# Patient Record
Sex: Male | Born: 1955
Health system: Southern US, Community
[De-identification: ages and names within clinical notes are randomized; demographics above are authoritative.]

## PROBLEM LIST (undated history)

## (undated) ENCOUNTER — Ambulatory Visit: Admission: EM | Source: Home / Self Care

## (undated) DIAGNOSIS — R55 Syncope and collapse: Secondary | ICD-10-CM

## (undated) DIAGNOSIS — G4700 Insomnia, unspecified: Secondary | ICD-10-CM

## (undated) DIAGNOSIS — Z86010 Personal history of colonic polyps: Secondary | ICD-10-CM

## (undated) DIAGNOSIS — M1990 Unspecified osteoarthritis, unspecified site: Secondary | ICD-10-CM

## (undated) DIAGNOSIS — E785 Hyperlipidemia, unspecified: Secondary | ICD-10-CM

## (undated) DIAGNOSIS — R569 Unspecified convulsions: Secondary | ICD-10-CM

## (undated) DIAGNOSIS — T7840XA Allergy, unspecified, initial encounter: Secondary | ICD-10-CM

## (undated) HISTORY — DX: Syncope and collapse: R55

## (undated) HISTORY — PX: NASAL FRACTURE SURGERY: SHX718

## (undated) HISTORY — PX: DENTAL SURGERY: SHX609

## (undated) HISTORY — PX: TONSILLECTOMY AND ADENOIDECTOMY: SUR1326

## (undated) HISTORY — DX: Hyperlipidemia, unspecified: E78.5

## (undated) HISTORY — DX: Personal history of colonic polyps: Z86.010

## (undated) HISTORY — DX: Allergy, unspecified, initial encounter: T78.40XA

## (undated) HISTORY — DX: Unspecified convulsions: R56.9

## (undated) HISTORY — DX: Unspecified osteoarthritis, unspecified site: M19.90

## (undated) HISTORY — DX: Insomnia, unspecified: G47.00

---

## 2002-06-05 ENCOUNTER — Encounter: Payer: Self-pay | Admitting: Emergency Medicine

## 2002-06-05 ENCOUNTER — Emergency Department (HOSPITAL_COMMUNITY): Admission: EM | Admit: 2002-06-05 | Discharge: 2002-06-05 | Payer: Self-pay | Admitting: Emergency Medicine

## 2004-12-04 ENCOUNTER — Encounter: Admission: RE | Admit: 2004-12-04 | Discharge: 2004-12-04 | Payer: Self-pay | Admitting: Family Medicine

## 2005-04-22 DIAGNOSIS — Z860100 Personal history of colon polyps, unspecified: Secondary | ICD-10-CM

## 2005-04-22 HISTORY — PX: OTHER SURGICAL HISTORY: SHX169

## 2005-04-22 HISTORY — DX: Personal history of colonic polyps: Z86.010

## 2005-04-22 HISTORY — DX: Personal history of colon polyps, unspecified: Z86.0100

## 2005-10-30 ENCOUNTER — Ambulatory Visit: Payer: Self-pay | Admitting: Family Medicine

## 2005-11-01 ENCOUNTER — Ambulatory Visit: Payer: Self-pay | Admitting: Family Medicine

## 2006-01-06 ENCOUNTER — Ambulatory Visit: Payer: Self-pay | Admitting: Internal Medicine

## 2006-01-14 ENCOUNTER — Ambulatory Visit: Payer: Self-pay | Admitting: Internal Medicine

## 2006-01-31 ENCOUNTER — Ambulatory Visit: Payer: Self-pay | Admitting: Family Medicine

## 2006-02-14 ENCOUNTER — Ambulatory Visit: Payer: Self-pay | Admitting: Family Medicine

## 2006-02-14 LAB — CONVERTED CEMR LAB
AST: 58 units/L — ABNORMAL HIGH (ref 0–37)
Chol/HDL Ratio, serum: 3.7
Cholesterol: 230 mg/dL (ref 0–200)
HDL: 61.6 mg/dL (ref >39.0)
LDL DIRECT: 149.3 mg/dL
VLDL: 19 mg/dL (ref 0–40)

## 2006-05-01 ENCOUNTER — Ambulatory Visit: Payer: Self-pay | Admitting: Family Medicine

## 2006-05-01 LAB — CONVERTED CEMR LAB: ALT: 86 units/L — ABNORMAL HIGH (ref 0–40)

## 2006-05-07 ENCOUNTER — Ambulatory Visit: Payer: Self-pay | Admitting: Family Medicine

## 2006-05-07 LAB — CONVERTED CEMR LAB: Hep B S Ab: NEGATIVE

## 2006-06-06 ENCOUNTER — Ambulatory Visit: Payer: Self-pay | Admitting: Internal Medicine

## 2006-06-06 LAB — CONVERTED CEMR LAB
ALT: 54 units/L — ABNORMAL HIGH (ref 0–40)
AST: 34 units/L (ref 0–37)
Albumin: 4.3 g/dL (ref 3.5–5.2)
Alkaline Phosphatase: 80 units/L (ref 39–117)
Anti Nuclear Antibody(ANA): NEGATIVE
Bilirubin, Direct: 0.2 mg/dL (ref 0.0–0.3)
Ceruloplasmin: 41 mg/dL (ref 21–63)
GGT: 323 units/L — ABNORMAL HIGH (ref 7–51)
Total Bilirubin: 1.1 mg/dL (ref 0.3–1.2)
Total Protein: 6.7 g/dL (ref 6.0–8.3)

## 2006-07-29 ENCOUNTER — Ambulatory Visit: Payer: Self-pay | Admitting: Family Medicine

## 2006-08-12 ENCOUNTER — Ambulatory Visit: Payer: Self-pay | Admitting: Family Medicine

## 2006-08-12 ENCOUNTER — Ambulatory Visit: Payer: Self-pay | Admitting: Internal Medicine

## 2006-08-12 LAB — CONVERTED CEMR LAB
AST: 28 units/L (ref 0–37)
Bilirubin, Direct: 0.1 mg/dL (ref 0.0–0.3)
Ferritin: 153 ng/mL (ref 22.0–322.0)
Triglycerides: 154 mg/dL — ABNORMAL HIGH (ref 0–149)

## 2006-09-25 ENCOUNTER — Ambulatory Visit: Payer: Self-pay | Admitting: Family Medicine

## 2006-09-25 DIAGNOSIS — E785 Hyperlipidemia, unspecified: Secondary | ICD-10-CM | POA: Insufficient documentation

## 2006-09-25 DIAGNOSIS — J309 Allergic rhinitis, unspecified: Secondary | ICD-10-CM | POA: Insufficient documentation

## 2006-09-25 DIAGNOSIS — I4510 Unspecified right bundle-branch block: Secondary | ICD-10-CM | POA: Insufficient documentation

## 2006-10-15 ENCOUNTER — Ambulatory Visit: Payer: Self-pay | Admitting: Family Medicine

## 2006-10-17 ENCOUNTER — Encounter (INDEPENDENT_AMBULATORY_CARE_PROVIDER_SITE_OTHER): Payer: Self-pay | Admitting: *Deleted

## 2006-10-17 LAB — CONVERTED CEMR LAB
Cholesterol: 233 mg/dL (ref 0–200)
Direct LDL: 130.6 mg/dL
HDL: 61.4 mg/dL (ref >39.0)
Total CHOL/HDL Ratio: 3.8
VLDL: 30 mg/dL (ref 0–40)

## 2007-02-23 ENCOUNTER — Ambulatory Visit: Payer: Self-pay | Admitting: Family Medicine

## 2007-02-23 DIAGNOSIS — B078 Other viral warts: Secondary | ICD-10-CM | POA: Insufficient documentation

## 2007-03-11 ENCOUNTER — Ambulatory Visit: Payer: Self-pay | Admitting: Family Medicine

## 2007-03-11 ENCOUNTER — Telehealth (INDEPENDENT_AMBULATORY_CARE_PROVIDER_SITE_OTHER): Payer: Self-pay | Admitting: *Deleted

## 2007-03-11 DIAGNOSIS — M8580 Other specified disorders of bone density and structure, unspecified site: Secondary | ICD-10-CM | POA: Insufficient documentation

## 2007-03-26 ENCOUNTER — Encounter (INDEPENDENT_AMBULATORY_CARE_PROVIDER_SITE_OTHER): Payer: Self-pay | Admitting: Family Medicine

## 2007-03-26 ENCOUNTER — Ambulatory Visit: Payer: Self-pay | Admitting: Internal Medicine

## 2007-04-13 ENCOUNTER — Telehealth (INDEPENDENT_AMBULATORY_CARE_PROVIDER_SITE_OTHER): Payer: Self-pay | Admitting: *Deleted

## 2007-05-29 ENCOUNTER — Ambulatory Visit: Payer: Self-pay | Admitting: Family Medicine

## 2007-05-29 DIAGNOSIS — D485 Neoplasm of uncertain behavior of skin: Secondary | ICD-10-CM | POA: Insufficient documentation

## 2007-06-03 ENCOUNTER — Ambulatory Visit: Payer: Self-pay | Admitting: Family Medicine

## 2007-06-04 ENCOUNTER — Encounter (INDEPENDENT_AMBULATORY_CARE_PROVIDER_SITE_OTHER): Payer: Self-pay | Admitting: *Deleted

## 2007-06-04 LAB — CONVERTED CEMR LAB
AST: 29 units/L (ref 0–37)
Glucose, Bld: 97 mg/dL (ref 70–99)
Total CHOL/HDL Ratio: 3.8
VLDL: 28 mg/dL (ref 0–40)

## 2007-06-09 ENCOUNTER — Ambulatory Visit: Payer: Self-pay | Admitting: Family Medicine

## 2007-06-12 ENCOUNTER — Telehealth (INDEPENDENT_AMBULATORY_CARE_PROVIDER_SITE_OTHER): Payer: Self-pay | Admitting: *Deleted

## 2007-07-17 ENCOUNTER — Telehealth (INDEPENDENT_AMBULATORY_CARE_PROVIDER_SITE_OTHER): Payer: Self-pay | Admitting: *Deleted

## 2007-08-26 ENCOUNTER — Ambulatory Visit: Payer: Self-pay | Admitting: Internal Medicine

## 2007-08-26 DIAGNOSIS — M545 Low back pain, unspecified: Secondary | ICD-10-CM | POA: Insufficient documentation

## 2007-11-12 ENCOUNTER — Telehealth (INDEPENDENT_AMBULATORY_CARE_PROVIDER_SITE_OTHER): Payer: Self-pay | Admitting: *Deleted

## 2007-11-18 ENCOUNTER — Ambulatory Visit: Payer: Self-pay | Admitting: Internal Medicine

## 2007-11-23 ENCOUNTER — Telehealth (INDEPENDENT_AMBULATORY_CARE_PROVIDER_SITE_OTHER): Payer: Self-pay | Admitting: *Deleted

## 2007-12-10 ENCOUNTER — Encounter: Payer: Self-pay | Admitting: Family Medicine

## 2007-12-10 ENCOUNTER — Telehealth (INDEPENDENT_AMBULATORY_CARE_PROVIDER_SITE_OTHER): Payer: Self-pay | Admitting: *Deleted

## 2007-12-16 ENCOUNTER — Ambulatory Visit: Payer: Self-pay | Admitting: Internal Medicine

## 2007-12-16 DIAGNOSIS — H6990 Unspecified Eustachian tube disorder, unspecified ear: Secondary | ICD-10-CM | POA: Insufficient documentation

## 2007-12-16 DIAGNOSIS — H6980 Other specified disorders of Eustachian tube, unspecified ear: Secondary | ICD-10-CM | POA: Insufficient documentation

## 2007-12-18 ENCOUNTER — Encounter (INDEPENDENT_AMBULATORY_CARE_PROVIDER_SITE_OTHER): Payer: Self-pay | Admitting: *Deleted

## 2007-12-21 ENCOUNTER — Encounter (INDEPENDENT_AMBULATORY_CARE_PROVIDER_SITE_OTHER): Payer: Self-pay | Admitting: *Deleted

## 2008-01-01 ENCOUNTER — Telehealth (INDEPENDENT_AMBULATORY_CARE_PROVIDER_SITE_OTHER): Payer: Self-pay | Admitting: *Deleted

## 2008-01-21 ENCOUNTER — Encounter: Payer: Self-pay | Admitting: Family Medicine

## 2008-05-25 ENCOUNTER — Telehealth (INDEPENDENT_AMBULATORY_CARE_PROVIDER_SITE_OTHER): Payer: Self-pay | Admitting: *Deleted

## 2008-06-28 ENCOUNTER — Ambulatory Visit: Payer: Self-pay | Admitting: Internal Medicine

## 2008-06-28 DIAGNOSIS — Z860100 Personal history of colon polyps, unspecified: Secondary | ICD-10-CM | POA: Insufficient documentation

## 2008-06-28 DIAGNOSIS — R55 Syncope and collapse: Secondary | ICD-10-CM | POA: Insufficient documentation

## 2008-07-06 ENCOUNTER — Ambulatory Visit: Payer: Self-pay | Admitting: Internal Medicine

## 2008-07-12 ENCOUNTER — Encounter (INDEPENDENT_AMBULATORY_CARE_PROVIDER_SITE_OTHER): Payer: Self-pay | Admitting: *Deleted

## 2008-07-18 LAB — CONVERTED CEMR LAB
Albumin: 4.2 g/dL (ref 3.5–5.2)
BUN: 17 mg/dL (ref 6–23)
Basophils Relative: 0.3 % (ref 0.0–3.0)
Calcium: 8.8 mg/dL (ref 8.4–10.5)
Cholesterol: 234 mg/dL — ABNORMAL HIGH (ref 0–200)
Direct LDL: 128.9 mg/dL
Eosinophils Absolute: 0.2 10*3/uL (ref 0.0–0.7)
GFR calc non Af Amer: 82.53 mL/min (ref >60)
Glucose, Bld: 97 mg/dL (ref 70–99)
HDL: 50.3 mg/dL (ref >39.00)
MCHC: 35.3 g/dL (ref 30.0–36.0)
MCV: 99.1 fL (ref 78.0–100.0)
Monocytes Absolute: 0.6 10*3/uL (ref 0.1–1.0)
Neutrophils Relative %: 60.8 % (ref 43.0–77.0)
Platelets: 230 10*3/uL (ref 150.0–400.0)
RBC: 4.75 M/uL (ref 4.22–5.81)
VLDL: 63.6 mg/dL — ABNORMAL HIGH (ref 0.0–40.0)

## 2008-11-11 ENCOUNTER — Encounter: Payer: Self-pay | Admitting: Internal Medicine

## 2008-12-23 ENCOUNTER — Ambulatory Visit: Payer: Self-pay | Admitting: Internal Medicine

## 2008-12-23 DIAGNOSIS — M25559 Pain in unspecified hip: Secondary | ICD-10-CM | POA: Insufficient documentation

## 2009-01-11 ENCOUNTER — Ambulatory Visit: Payer: Self-pay | Admitting: Internal Medicine

## 2009-01-16 ENCOUNTER — Telehealth (INDEPENDENT_AMBULATORY_CARE_PROVIDER_SITE_OTHER): Payer: Self-pay | Admitting: *Deleted

## 2009-01-19 ENCOUNTER — Encounter (INDEPENDENT_AMBULATORY_CARE_PROVIDER_SITE_OTHER): Payer: Self-pay | Admitting: *Deleted

## 2009-04-17 ENCOUNTER — Telehealth (INDEPENDENT_AMBULATORY_CARE_PROVIDER_SITE_OTHER): Payer: Self-pay | Admitting: *Deleted

## 2009-05-10 ENCOUNTER — Ambulatory Visit: Payer: Self-pay | Admitting: Internal Medicine

## 2009-05-11 LAB — CONVERTED CEMR LAB: Vit D, 25-Hydroxy: 58 ng/mL (ref 30–89)

## 2009-05-12 LAB — CONVERTED CEMR LAB
Albumin: 4.1 g/dL (ref 3.5–5.2)
Basophils Relative: 0.3 % (ref 0.0–3.0)
Bilirubin, Direct: 0.1 mg/dL (ref 0.0–0.3)
Eosinophils Relative: 3 % (ref 0.0–5.0)
GFR calc non Af Amer: 92.91 mL/min (ref >60)
Glucose, Bld: 99 mg/dL (ref 70–99)
HCT: 49.3 % (ref 39.0–52.0)
HDL: 63.8 mg/dL (ref >39.00)
Hemoglobin: 16.2 g/dL (ref 13.0–17.0)
Lymphocytes Relative: 24.2 % (ref 12.0–46.0)
Lymphs Abs: 1.1 10*3/uL (ref 0.7–4.0)
Monocytes Relative: 11 % (ref 3.0–12.0)
Neutro Abs: 2.7 10*3/uL (ref 1.4–7.7)
Potassium: 4.1 meq/L (ref 3.5–5.1)
RBC: 4.83 M/uL (ref 4.22–5.81)
Sodium: 142 meq/L (ref 135–145)
TSH: 1.63 microintl units/mL (ref 0.35–5.50)
Total Protein: 6.7 g/dL (ref 6.0–8.3)
Triglycerides: 197 mg/dL — ABNORMAL HIGH (ref 0.0–149.0)
VLDL: 39.4 mg/dL (ref 0.0–40.0)
WBC: 4.4 10*3/uL — ABNORMAL LOW (ref 4.5–10.5)

## 2009-05-17 ENCOUNTER — Encounter: Payer: Self-pay | Admitting: Internal Medicine

## 2009-05-17 ENCOUNTER — Ambulatory Visit: Payer: Self-pay | Admitting: Internal Medicine

## 2009-07-05 ENCOUNTER — Ambulatory Visit: Payer: Self-pay | Admitting: Internal Medicine

## 2009-07-05 DIAGNOSIS — R7989 Other specified abnormal findings of blood chemistry: Secondary | ICD-10-CM | POA: Insufficient documentation

## 2009-07-05 DIAGNOSIS — E559 Vitamin D deficiency, unspecified: Secondary | ICD-10-CM | POA: Insufficient documentation

## 2009-07-05 DIAGNOSIS — R945 Abnormal results of liver function studies: Secondary | ICD-10-CM

## 2009-11-14 ENCOUNTER — Encounter: Payer: Self-pay | Admitting: Internal Medicine

## 2009-11-16 ENCOUNTER — Ambulatory Visit: Payer: Self-pay | Admitting: Internal Medicine

## 2009-11-29 LAB — CONVERTED CEMR LAB
ALT: 41 units/L (ref 0–53)
Alkaline Phosphatase: 67 units/L (ref 39–117)
Bilirubin, Direct: 0.1 mg/dL (ref 0.0–0.3)
Indirect Bilirubin: 0.4 mg/dL (ref 0.0–0.9)
Total Protein: 6.5 g/dL (ref 6.0–8.3)

## 2010-05-24 NOTE — Assessment & Plan Note (Signed)
Summary: follow-up on blood work//lch   Vital Signs:  Patient profile:   55 year old male Weight:      204.4 pounds Pulse rate:   76 / minute Resp:     15 per minute BP sitting:   122 / 88  (left arm) Cuff size:   large  Vitals Entered By: Shonna Chock (July 05, 2009 2:55 PM) CC: Folllow-up on Labs from 04/2008 Comments REVIEWED MED LIST, PATIENT AGREED DOSE AND INSTRUCTION CORRECT    CC:  Folllow-up on Labs from 04/2008.  History of Present Illness: Labs reviewed  & old chart reviewed . Dr Leone Payor ,GI, evaluated  elevated AST & ALT in 2008. These have varied from  ALT 29 & AST 28 in 07/2006 to 86 & 69 in 04/2006. Last values 62 & 43 in 04/2009. Beer 2-3  every other day; no excess Tylenol or Vit A. LDL is 147.8 on Zetia 10 mg once daily. He is on Glucosamine . Previously on Lipitor, Crestor. No specific diet; CVE as walking 3/4 mpd 7X/week w/o symptoms. No FH of premature CAD/ MI.  Supplements reviewed .  Allergies: No Known Drug Allergies  Past History:  Past Medical History: NEOPLASM, SKIN, L chest , UNCERTAIN BEHAVIOR (ICD-238.2), benign pathology OSTEOPENIA (ICD-733.90),due to Dilantin, T score -1.3 @ spine  PLANTAR WART (ICD-078.19) RBBB (ICD-426.4) ALLERGIC RHINITIS, SEASONAL (ICD-477.0) HYPERLIPIDEMIA (ICD-272.4) ALLERGIC RHINITIS (ICD-477.9) SyncopeX 2 in 20s, Dilantin Rxed, Dr Sandria Manly Colonic polyps, hx of (small rectal polyp) 2007,Dr Leone Payor LFT elevation (790.4); Vitamin D deficiency (29 in 2010), corrected   Review of Systems General:  Denies chills, fever, and sweats. CV:  Denies chest pain or discomfort, leg cramps with exertion, palpitations, and shortness of breath with exertion. GI:  Denies abdominal pain, bloody stools, dark tarry stools, and indigestion; No clay colored stool . GU:  No coke colored urine.  Physical Exam  General:  well-nourished; alert,appropriate and cooperative throughout examination Eyes:  No corneal or conjunctival inflammation  noted.Perrla.  No icterus Neck:  No deformities, masses, or tenderness noted. Lungs:  Normal respiratory effort, chest expands symmetrically. Lungs are clear to auscultation, no crackles or wheezes. Heart:  Normal rate and regular rhythm. S1 and S2 normal without gallop, murmur, click, rub.S4 Abdomen:  Bowel sounds positive,abdomen soft and non-tender without masses, organomegaly or hernias noted. Pulses:  R and L carotid,radial,dorsalis pedis and posterior tibial pulses are full and equal bilaterally Extremities:  No clubbing, cyanosis, edema. Neurologic:  alert & oriented X3 and DTRs symmetrical and normal.   Skin:  Intact without suspicious lesions or rashes. No jaundice Cervical Nodes:  No lymphadenopathy noted Axillary Nodes:  No palpable lymphadenopathy Psych:  memory intact for recent and remote, normally interactive, and good eye contact.  Focused & motivated    Impression & Recommendations:  Problem # 1:  ASPARTATE AMINOTRANSFERASE, SERUM, ELEVATED (ICD-790.4)  Problem # 2:  VITAMIN D DEFICIENCY (ICD-268.9) corrected  Problem # 3:  HYPERLIPIDEMIA (ICD-272.4)  The following medications were removed from the medication list:    Zetia 10 Mg Tabs (Ezetimibe) .Marland Kitchen... Take one tablet daily  Complete Medication List: 1)  Alprazolam 0.5 Mg Tabs (Alprazolam) .... Take one tablet daily as needed. 2)  Dilantin 100 Mg Caps (Phenytoin sodium extended) .... Take five tablets daily-brand name only 3)  Mens Multivitamin Plus Tabs (Multiple vitamins-minerals) 4)  Oscal 500/200 D-3 500-200 Mg-unit Tabs (Calcium-vitamin d) 5)  Allegra 180 Mg Tabs (Fexofenadine hcl) .... Take one tablet daily 6)  Tramadol Hcl 50  Mg Tabs (Tramadol hcl) .Marland Kitchen.. 1-2 q 6 hrs as needed 7)  Cyclobenzaprine Hcl 5 Mg Tabs (Cyclobenzaprine hcl) .Marland Kitchen.. 1-2 at bedtime as needed 8)  Vitamin D 50,000 Iu  .... One pill once weekly  Patient Instructions: 1)  Less than 40 grams of High Fructose Corn Syrup "sugar" / day . Hold  Glucosamine. After 4-5 months of nutritional changes check fasting NMR Lipoprofile ; Hepatic Panel;Vitamin D level; 2)  HbgA1C . Codes 272.4, 790.4. Stop Zetia also. Prescriptions: ALPRAZOLAM 0.5 MG  TABS (ALPRAZOLAM) Take one tablet daily as needed.  #30 x 5   Entered and Authorized by:   Marga Melnick MD   Signed by:   Marga Melnick MD on 07/05/2009   Method used:   Print then Give to Patient   RxID:   [id number] DILANTIN 100 MG  CAPS (PHENYTOIN SODIUM EXTENDED) Take five tablets daily-brand name only  #450.0 Each x 3   Entered and Authorized by:   Marga Melnick MD   Signed by:   Marga Melnick MD on 07/05/2009   Method used:   Printed then faxed to ...       [facility]* (mail-order)             ,          Ph: [phone]       Fax: [phone]   RxID:   [id number]

## 2010-05-24 NOTE — Miscellaneous (Signed)
Summary: BONE DENSITY  Clinical Lists Changes  Orders: Added new Test order of T-Bone Densitometry (77080) - Signed Added new Test order of T-Lumbar Vertebral Assessment (77082) - Signed 

## 2010-07-12 ENCOUNTER — Other Ambulatory Visit: Payer: Self-pay | Admitting: Internal Medicine

## 2010-07-13 NOTE — Telephone Encounter (Signed)
Kenneth [name], what is process for getting the chart data transferred from [facility] to Epic . We are receiving med refill requests w/o any Epic entries? Thanks, [employer]

## 2010-07-13 NOTE — Telephone Encounter (Signed)
Please check [facility] as to last appt ; must be seen every 12 months.

## 2010-09-04 NOTE — Assessment & Plan Note (Signed)
[facility]                        [place] OFFICE NOTE   Kenneth [name]                   MRN:          [id number]  DATE:09/25/2006                            DOB:          02-15-1956    REASON FOR VISIT:  Possible Strep.   Kenneth [name] is a patient of ours who was worked in today for a sore  throat.  He reports this has also been associated with a dry cough.  He  has had significant postnasal drip.  He does have a history of  allergies.  He does report that he was using Veramyst, which was  effective, but it ran out, he would like another refill.  He does state  that his wife has been sick with coughing, congestion and was recently  diagnosed with bronchitis.  He has no other specific complaints.   MEDICATIONS:  1. Dilantin 100 mg, five tablets daily.  2. Zetia 10 mg daily.  3. __________ mg p.r.n.  4. Xanax 0.5 mg p.r.n.  5. Allegra 180.  6. Veramyst.   OBJECTIVE:  Weight 201.6, temperature 98.1, blood pressure 130/90.  Of  note, the patient has been using Allegra D for the last couple of days.  GENERAL:  We have a pleasant male in no acute distress, answers  questions appropriately.  HEENT:  Nasal mucosa was boggy with swollen turbinates.  Oropharynx is  minimally erythematous with postnasal drip.  NECK:  Supple, no lymphadenopathy.  LUNGS:  Clear with good air movement.  HEART:  Regular rate and rhythm.   Strep was negative.   IMPRESSION:  Urinary tract infection.   PLAN:  1. I provided patient samples of Veramyst and a 90-day supply per mail      order.  2. Anticipatory guidance discussed.  Recommended patient use OTC      Mucinex.  Also advised that he could push back to his regular      Allegra 180.  3. I did provide a prescription for Z-Pak and advised that the      symptoms would improve in the next 4-5 days.  He is to go ahead and      start the Z-Pak, or if symptoms worsen he should followup.   ADDENDUM:   Patient reports that he has a small area on the plantar  aspect of his foot that has been an annoyance off and on.  He doesn't  know if it is a wart or if he stepped on something several months ago.  I blanched the area and noted that he had a slightly hyperkeratotic  papule  just proximal to the fifth digit, nontender to palpation.  Advised  patient he could schedule an  appointment in the next week or two for treatment of a wart.  Obviously,  to be seen sooner if he has any signs of infection or significant  discomfort.     Kenneth [name], M.D.  Electronically Signed    LA/MedQ  DD: 09/25/2006  DT: 09/25/2006  Job #: [id number]

## 2010-09-07 NOTE — Assessment & Plan Note (Signed)
[facility]                         GASTROENTEROLOGY OFFICE NOTE   Kenneth [name]                   MRN:          [id number]  DATE:06/06/2006                            DOB:          07-06-1955    REFERRING PHYSICIAN:  Leanne Chang, M.D.   PROBLEM:  Elevated liver function studies.   HISTORY:  Kenneth [name] is a pleasant 55 year old white male, who has history of  hyperlipidemia over the past 10 years.  He has otherwise been fairly  healthy.  He has been on dilantin for approximately 30 years since  having what was felt to be a couple of seizures in his early 57s.  He  had been evaluated at that time by Dr. Sandria Manly, and patient says that there  was not anything very conclusive found at that time, but he has remained  on medication, has done well, and has not had any recurrence.   The patient relates that he has been aware of having some abnormal liver  tests over the past couple of years, had not been told at any point  prior to that he had any liver trouble, has no history of hepatitis,  etc.  He says he has been on several statins over the past 10 years, and  was taken off of medication last fall because of his liver enzymes.   Patient does drink alcohol, usually 2 beers per day.  Denies any other  illicit drug use.  He has not had any transfusions, has no tattoos, has  no family history of any liver disease that he is aware of.   Abdominal ultrasound done in March of 2006 at [facility] was  read as normal without any evidence of fatty liver or other abnormality.  Liver enzymes from January of 2005 show a total bilirubin of 0.4, alk  phos of 112, HSGOT, I believe, of 67, and an STPT of 76, triglycerides  were 297 and cholesterol was 228.  Followup in March of 2006, total  bilirubin of 0.5, alk phos of 100, SGOT of 104, STPT 149, and followup  January of 2008, an SGOT of 69, SGPT of 86.  Cholesterol at that time  230.  Hepatitis  serologies have been done, and we will obtain copies of  these, negative per patient.   CURRENT MEDICATIONS:  1. Dilantin 500 mg daily.  2. Multivitamin daily.  3. Vitamin C and B daily.  4. Calcium supplement daily.  5. Allegra D daily.  6. Xanax 0.5 p.r.n.  7. Nasacort p.r.n.   ALLERGIES:  NO KNOWN DRUG ALLERGIES.   PAST HISTORY:  As outlined above.  Hyperlipidemia, he is status post  tonsillectomy, and had a nasal surgery.  He had colonoscopy here in  September of 2007, which was for screening.  Had internal and external  hemorrhoids and 1 small polyp, which was removed.   FAMILY HISTORY:  Negative for liver disease as far as he is aware,  otherwise noncontributory.   SOCIAL HISTORY:  Patient is married, does not have any children.  He is  employed in [employer].  He is a nonsmoker, does drink  alcohol.  Generally, 2 beers per day.   REVIEW OF SYSTEMS:  Cardiovascular, pulmonary, genitourinary, and  musculoskeletal and neuro completely negative.  GI as outlined above.   PHYSICAL EXAMINATION:  Well-developed white male in no acute distress.  Height of 5 feet 9 inches, weight is 204.8.  Blood pressure 142/94,  pulse is 74.  HEENT:  Nontraumatic, normocephalic.  EOMI, PERRLA.  Sclerae anicteric.  CARDIOVASCULAR:  Regular rate and rhythm with S1 and S2, no murmurs,  rubs, or gallops.  PULMONARY:  Clear to A&P.  ABDOMEN:  Soft and nontender.  There is no palpable mass or  hepatosplenomegaly.  Bowel sounds are active.  RECTAL EXAM:  Not done today.  EXTREMITIES AND SKIN:  No stigmata of chronic liver disease.   IMPRESSION:  A 55 year old white male with chronic transaminitis,  etiology not clear.  Rule out chronic active hepatitis, rule out drug-  induced, i.e., dilantin, rule out EtOH induced, rule out NASH though  negative ultrasound 2006.   PLAN:  1. Check hepatic panel.  2. Obtain copies of his viral hepatitis serologies.  3. Will send a chronic hepatic  markers to include alpha 1 antitrypsin,      ceruloplasmin, ANA, AMA, and antismooth muscle antibody, and      ferritin.  4. Patient may need to be referred back to Dr. Sandria Manly to consider      discontinuing his dilantin at this point.      Mike Gip, PA-C  Electronically Signed      Iva Boop, MD,FACG  Electronically Signed   AE/MedQ  DD: 06/06/2006  DT: 06/06/2006  Job #: [id number]   cc:   Leanne Chang, M.D.

## 2010-09-13 ENCOUNTER — Other Ambulatory Visit: Payer: Self-pay | Admitting: Internal Medicine

## 2010-09-13 NOTE — Telephone Encounter (Signed)
Please advise on what patient needs a refill on

## 2010-09-21 ENCOUNTER — Other Ambulatory Visit: Payer: Self-pay | Admitting: Internal Medicine

## 2010-09-21 DIAGNOSIS — T887XXA Unspecified adverse effect of drug or medicament, initial encounter: Secondary | ICD-10-CM

## 2010-09-24 ENCOUNTER — Other Ambulatory Visit (INDEPENDENT_AMBULATORY_CARE_PROVIDER_SITE_OTHER): Payer: Federal, State, Local not specified - PPO

## 2010-09-24 DIAGNOSIS — T887XXA Unspecified adverse effect of drug or medicament, initial encounter: Secondary | ICD-10-CM

## 2010-09-24 NOTE — Progress Notes (Signed)
Labs only

## 2010-09-25 LAB — PHENYTOIN LEVEL, TOTAL: Phenytoin Lvl: 14.6 ug/mL (ref 10.0–20.0)

## 2010-10-03 ENCOUNTER — Telehealth: Payer: Self-pay | Admitting: Internal Medicine

## 2010-10-03 MED ORDER — PHENYTOIN SODIUM EXTENDED 100 MG PO CAPS: ORAL_CAPSULE | ORAL | Status: DC

## 2010-10-03 NOTE — Telephone Encounter (Signed)
Patient aware RX will be available after 2 pm

## 2010-10-03 NOTE — Telephone Encounter (Signed)
Patient want rx for dilantin 90 day supply & refills - he will pick - just had lab to check level

## 2010-11-09 ENCOUNTER — Other Ambulatory Visit: Payer: Self-pay | Admitting: Internal Medicine

## 2010-11-09 NOTE — Telephone Encounter (Addendum)
Last office visit was 07/05/09 will contact pt for appt pls schedule

## 2010-11-09 NOTE — Telephone Encounter (Signed)
RX was filled in May with notation that ov was due. Please advise.

## 2010-11-09 NOTE — Telephone Encounter (Signed)
Verify last OV ; if > 18 months deny meds . If < 12 months renew , but make appt within next 30 days

## 2010-11-09 NOTE — Telephone Encounter (Signed)
Left msg for pt to return call.   Pt aware office visit needed for further refills has pending appt on 12/27/2010

## 2010-12-21 ENCOUNTER — Other Ambulatory Visit: Payer: Self-pay | Admitting: Internal Medicine

## 2010-12-21 NOTE — Telephone Encounter (Signed)
Last seen 07/05/09 and last filled 11/09/10

## 2010-12-21 NOTE — Telephone Encounter (Signed)
He has not been seen since March 2011, 17 months. Follow would be necessary prior to refill.

## 2010-12-21 NOTE — Telephone Encounter (Signed)
Left message to call office

## 2010-12-26 NOTE — Telephone Encounter (Signed)
Pt will get Rx at OV tomorrow.

## 2010-12-26 NOTE — Telephone Encounter (Signed)
Patient is scheduled for a CPX on September 6th.  Needs the refill called in.

## 2010-12-27 ENCOUNTER — Other Ambulatory Visit: Payer: Self-pay | Admitting: Internal Medicine

## 2010-12-27 ENCOUNTER — Ambulatory Visit (INDEPENDENT_AMBULATORY_CARE_PROVIDER_SITE_OTHER): Payer: Federal, State, Local not specified - PPO | Admitting: Internal Medicine

## 2010-12-27 ENCOUNTER — Encounter: Payer: Self-pay | Admitting: Internal Medicine

## 2010-12-27 VITALS — BP 140/92 | HR 72 | Temp 98.4°F | Resp 12 | Ht 68.75 in | Wt 202.2 lb

## 2010-12-27 DIAGNOSIS — Z0000 Encounter for general adult medical examination without abnormal findings: Secondary | ICD-10-CM

## 2010-12-27 DIAGNOSIS — R55 Syncope and collapse: Secondary | ICD-10-CM

## 2010-12-27 DIAGNOSIS — M899 Disorder of bone, unspecified: Secondary | ICD-10-CM

## 2010-12-27 DIAGNOSIS — Z86010 Personal history of colonic polyps: Secondary | ICD-10-CM

## 2010-12-27 DIAGNOSIS — E785 Hyperlipidemia, unspecified: Secondary | ICD-10-CM

## 2010-12-27 DIAGNOSIS — E559 Vitamin D deficiency, unspecified: Secondary | ICD-10-CM

## 2010-12-27 NOTE — Patient Instructions (Addendum)
Preventive Health Care: Exercise at least 30-45 minutes a day,  3-4 days a week.  Eat a low-fat diet with lots of fruits and vegetables, up to 7-9 servings per day. Consume less than 40 grams of sugar per day from foods & drinks with High Fructose Corn Sugar as # 1,2,3 or # 4 on label. Health Care Power of Attorney & Living Will. Complete if not in place ; these place you in charge of your health care decisions. To prevent palpitations or premature beats, avoid stimulants such as decongestants, diet pills, nicotine, or caffeine (coffee, tea, cola, or chocolate) to excess.  Please  schedule fasting Labs : BMET,Lipids, hepatic panel, CBC & dif, TSH,vitamin D level, Dilantin level. (see Diagnoses for Codes)

## 2010-12-27 NOTE — Progress Notes (Signed)
Subjective:    Patient ID: Kenneth [name], male    DOB: Oct 10, 1955, 55 y.o.   MRN: [id number]  HPI  Kenneth [name]  is here for a physical;acute issues  Include recent flare of LB syndrome. He is using NSAIDS & ice with improvement.      Review of Systems Patient reports no  vision/ hearing changes,anorexia, weight change, fever ,adenopathy, persistant / recurrent hoarseness, swallowing issues, chest pain,palpitations, edema,persistant / recurrent cough, hemoptysis, dyspnea(rest, exertional, paroxysmal nocturnal), gastrointestinal  bleeding (melena, rectal bleeding), abdominal pain, excessive heart burn, GU symptoms( dysuria, hematuria, pyuria, voiding/incontinence  issues) syncope, focal weakness, memory loss,numbness & tingling, skin/hair/nail changes,depression, anxiety, or  abnormal bruising/bleeding.    Objective:   Physical Exam Gen.: Healthy and well-nourished in appearance. Alert, appropriate and cooperative throughout exam. Head: Normocephalic without obvious abnormalities Eyes: No corneal or conjunctival inflammation noted. Pupils equal round reactive to light and accommodation. Fundal exam is benign without hemorrhages, exudate, papilledema. Extraocular motion intact. Vision grossly normal. Ears: External  ear exam reveals no significant lesions or deformities. Canals clear .TMs normal. Hearing is grossly normal bilaterally. Nose: External nasal exam reveals no deformity or inflammation. Nasal mucosa are pink and moist. No lesions or exudates noted.   Mouth: Oral mucosa and oropharynx reveal no lesions or exudates. Teeth in good repair. Neck: No deformities, masses, or tenderness noted. Range of motion &. Thyroid  normal. Lungs: Normal respiratory effort; chest expands symmetrically. Lungs are clear to auscultation without rales, wheezes, or increased work of breathing. Heart: Normal rate and rhythm. Normal S1 and S2. No gallop, click, or rub. No  Murmur. Xiphoid prominent Abdomen:  Bowel sounds normal; abdomen soft and nontender. No masses, organomegaly. Ventral  Hernia  noted. Genitalia/ DRE: no abnormalities present   .                                                                                   Musculoskeletal/extremities: No deformity or scoliosis noted of  the thoracic or lumbar spine. No clubbing, cyanosis, edema, or deformity noted. Range of motion  normal .Tone & strength  normal.Joints normal. Nail health  good. Vascular: Carotid, radial artery, dorsalis pedis and  posterior tibial pulses are full and equal. No bruits present. Neurologic: Alert and oriented x3. Deep tendon reflexes symmetrical and normal.         Skin: Intact without suspicious lesions or rashes. Lymph: No cervical, axillary, or inguinal lymphadenopathy present. Psych: Mood and affect are normal. Normally interactive                                                                                         Assessment & Plan:  #1 comprehensive physical exam; no acute findings #2 see Problem List with Assessments & Recommendations Plan: see Orders   Note: EKG reveals 1 PAC; incomplete  right bundle branch block; and inverted T waves in V 1-2. These changes were documented on EKG in 03/2004.

## 2010-12-28 ENCOUNTER — Other Ambulatory Visit (INDEPENDENT_AMBULATORY_CARE_PROVIDER_SITE_OTHER): Payer: Federal, State, Local not specified - PPO

## 2010-12-28 ENCOUNTER — Other Ambulatory Visit: Payer: Self-pay

## 2010-12-28 DIAGNOSIS — Z86010 Personal history of colonic polyps: Secondary | ICD-10-CM

## 2010-12-28 DIAGNOSIS — E559 Vitamin D deficiency, unspecified: Secondary | ICD-10-CM

## 2010-12-28 DIAGNOSIS — R55 Syncope and collapse: Secondary | ICD-10-CM

## 2010-12-28 DIAGNOSIS — Z0000 Encounter for general adult medical examination without abnormal findings: Secondary | ICD-10-CM

## 2010-12-28 DIAGNOSIS — E785 Hyperlipidemia, unspecified: Secondary | ICD-10-CM

## 2010-12-28 DIAGNOSIS — M899 Disorder of bone, unspecified: Secondary | ICD-10-CM

## 2010-12-28 LAB — CBC WITH DIFFERENTIAL/PLATELET
Eosinophils Relative: 2.9 % (ref 0.0–5.0)
Lymphocytes Relative: 23 % (ref 12.0–46.0)
Monocytes Absolute: 0.6 10*3/uL (ref 0.1–1.0)
Monocytes Relative: 9.3 % (ref 3.0–12.0)
Neutrophils Relative %: 64.3 % (ref 43.0–77.0)
Platelets: 212 10*3/uL (ref 150.0–400.0)
WBC: 6.4 10*3/uL (ref 4.5–10.5)

## 2010-12-28 LAB — TSH: TSH: 1.96 u[IU]/mL (ref 0.35–5.50)

## 2010-12-28 LAB — BASIC METABOLIC PANEL
BUN: 16 mg/dL (ref 6–23)
Calcium: 9 mg/dL (ref 8.4–10.5)
Creatinine, Ser: 0.9 mg/dL (ref 0.4–1.5)
GFR: 93.57 mL/min (ref >60.00)
Glucose, Bld: 104 mg/dL — ABNORMAL HIGH (ref 70–99)
Potassium: 4 mEq/L (ref 3.5–5.1)

## 2010-12-28 LAB — LIPID PANEL
Cholesterol: 261 mg/dL — ABNORMAL HIGH (ref 0–200)
HDL: 69.6 mg/dL (ref >39.00)
Triglycerides: 137 mg/dL (ref 0.0–149.0)

## 2010-12-28 LAB — HEPATIC FUNCTION PANEL
ALT: 55 U/L — ABNORMAL HIGH (ref 0–53)
AST: 38 U/L — ABNORMAL HIGH (ref 0–37)
Albumin: 4.3 g/dL (ref 3.5–5.2)
Total Protein: 7 g/dL (ref 6.0–8.3)

## 2010-12-28 MED ORDER — ALPRAZOLAM 0.5 MG PO TABS: 0.5000 mg | ORAL_TABLET | Freq: Every day | ORAL | Status: DC | PRN

## 2010-12-28 NOTE — Progress Notes (Signed)
12  

## 2010-12-28 NOTE — Telephone Encounter (Signed)
Patient called requesting rx be sent to pharmacy

## 2010-12-28 NOTE — Progress Notes (Signed)
Labs only

## 2010-12-29 LAB — VITAMIN D 25 HYDROXY (VIT D DEFICIENCY, FRACTURES): Vit D, 25-Hydroxy: 51 ng/mL (ref 30–89)

## 2010-12-29 LAB — PHENYTOIN LEVEL, TOTAL: Phenytoin Lvl: 11.2 ug/mL (ref 10.0–20.0)

## 2011-01-02 LAB — HEMOGLOBIN A1C: Hgb A1c MFr Bld: 5.3 % (ref 4.6–6.5)

## 2011-01-03 ENCOUNTER — Other Ambulatory Visit: Payer: Self-pay | Admitting: *Deleted

## 2011-01-03 MED ORDER — PRAVASTATIN SODIUM 20 MG PO TABS: 20.0000 mg | ORAL_TABLET | Freq: Every evening | ORAL | Status: DC

## 2011-02-08 ENCOUNTER — Encounter: Payer: Self-pay | Admitting: Internal Medicine

## 2011-05-13 ENCOUNTER — Other Ambulatory Visit: Payer: Self-pay | Admitting: Internal Medicine

## 2011-07-26 ENCOUNTER — Other Ambulatory Visit: Payer: Self-pay | Admitting: Internal Medicine

## 2011-07-26 NOTE — Telephone Encounter (Signed)
Please advise Xanax refill? Last refill 05-14-11 with 1 refill?

## 2011-07-26 NOTE — Telephone Encounter (Signed)
OK X1 

## 2011-07-26 NOTE — Telephone Encounter (Signed)
Refill done.  

## 2011-08-16 ENCOUNTER — Other Ambulatory Visit: Payer: Self-pay | Admitting: Internal Medicine

## 2011-09-13 ENCOUNTER — Other Ambulatory Visit: Payer: Self-pay | Admitting: Internal Medicine

## 2011-09-24 ENCOUNTER — Encounter: Payer: Self-pay | Admitting: Internal Medicine

## 2011-09-26 ENCOUNTER — Ambulatory Visit (INDEPENDENT_AMBULATORY_CARE_PROVIDER_SITE_OTHER): Payer: Federal, State, Local not specified - PPO | Admitting: Internal Medicine

## 2011-09-26 ENCOUNTER — Encounter: Payer: Self-pay | Admitting: Internal Medicine

## 2011-09-26 VITALS — BP 132/90 | HR 62 | Temp 98.0°F | Wt 202.0 lb

## 2011-09-26 DIAGNOSIS — J029 Acute pharyngitis, unspecified: Secondary | ICD-10-CM

## 2011-09-26 MED ORDER — FLUTICASONE PROPIONATE 50 MCG/ACT NA SUSP: 2.0000 | Freq: Every day | NASAL | Status: DC

## 2011-09-26 NOTE — Patient Instructions (Signed)
Continue with Allegra, start taking Flonase 2 puffs on each side of the nose daily Call if not improving in the next 2 weeks

## 2011-09-26 NOTE — Progress Notes (Signed)
  Subjective:    Patient ID: Kenneth [name], male    DOB: 22-Sep-1955, 56 y.o.   MRN: [id number]  HPI Acute visit Sore throat for the last 3 weeks, started with a low grade fever and a feeling of a cold, most symptoms resolved except for the sore throat. He admits to some nasal-throat congestion . Patient is a nonsmoker  Past Medical History  Diagnosis Date  . Syncope     in 20s; no etiology, no recurrence. Dilantin Rxed    Review of Systems Very little cough Some postnasal dripping No GERD symptoms Eyes and nose are slightly itchy, eyes occasional watery. He takes Careers adviser and seems to help.    Objective:   Physical Exam General -- alert, well-developed, and overweight appearing. No apparent distress.  Neck --no LADs HEENT -- TMs normal, throat w/o redness, face symmetric and not tender to palpation, nose slt congested   Lungs -- normal respiratory effort, no intercostal retractions, no accessory muscle use, and normal breath sounds.   Heart-- normal rate, regular rhythm, no murmur, and no gallop.   Neurologic-- alert & oriented X3 and strength normal in all extremities. Psych-- Cognition and judgment appear intact. Alert and cooperative with normal attention span and concentration.  not anxious appearing and not depressed appearing.        Assessment & Plan:   Sore throat, Strep test negative, symptoms likely secondary to postnasal dripping. Plan: Continue with Allegra, add Flonase, see instructions.

## 2011-09-27 ENCOUNTER — Other Ambulatory Visit: Payer: Self-pay | Admitting: Internal Medicine

## 2011-09-28 ENCOUNTER — Encounter: Payer: Self-pay | Admitting: Internal Medicine

## 2011-09-30 ENCOUNTER — Ambulatory Visit: Payer: Federal, State, Local not specified - PPO | Admitting: Internal Medicine

## 2011-10-22 ENCOUNTER — Ambulatory Visit (AMBULATORY_SURGERY_CENTER): Payer: Federal, State, Local not specified - PPO

## 2011-10-22 ENCOUNTER — Encounter: Payer: Self-pay | Admitting: Internal Medicine

## 2011-10-22 ENCOUNTER — Other Ambulatory Visit: Payer: Self-pay | Admitting: Internal Medicine

## 2011-10-22 VITALS — Ht 70.0 in | Wt 200.1 lb

## 2011-10-22 DIAGNOSIS — K649 Unspecified hemorrhoids: Secondary | ICD-10-CM

## 2011-10-22 DIAGNOSIS — Z1211 Encounter for screening for malignant neoplasm of colon: Secondary | ICD-10-CM

## 2011-10-22 DIAGNOSIS — Z86010 Personal history of colonic polyps: Secondary | ICD-10-CM

## 2011-10-22 MED ORDER — MOVIPREP 100 G PO SOLR: ORAL | Status: DC

## 2011-10-23 NOTE — Telephone Encounter (Signed)
RX called in .

## 2011-11-06 ENCOUNTER — Encounter: Payer: Federal, State, Local not specified - PPO | Admitting: Internal Medicine

## 2011-11-12 ENCOUNTER — Ambulatory Visit (AMBULATORY_SURGERY_CENTER): Payer: Federal, State, Local not specified - PPO | Admitting: Internal Medicine

## 2011-11-12 ENCOUNTER — Encounter: Payer: Self-pay | Admitting: Internal Medicine

## 2011-11-12 VITALS — BP 135/74 | HR 57 | Temp 96.2°F | Resp 21 | Ht 66.0 in | Wt 200.0 lb

## 2011-11-12 DIAGNOSIS — K649 Unspecified hemorrhoids: Secondary | ICD-10-CM

## 2011-11-12 DIAGNOSIS — Z1211 Encounter for screening for malignant neoplasm of colon: Secondary | ICD-10-CM

## 2011-11-12 DIAGNOSIS — Z86010 Personal history of colonic polyps: Secondary | ICD-10-CM

## 2011-11-12 MED ORDER — SODIUM CHLORIDE 0.9 % IV SOLN: 500.0000 mL | INTRAVENOUS | Status: DC

## 2011-11-12 NOTE — Progress Notes (Signed)
Patient did not experience any of the following events: a burn prior to discharge; a fall within the facility; wrong site/side/patient/procedure/implant event; or a hospital transfer or hospital admission upon discharge from the facility. (G8907) Patient did not have preoperative order for IV antibiotic SSI prophylaxis. (G8918)  

## 2011-11-12 NOTE — Op Note (Signed)
[facility] [address] [place], [place]  [address]  COLONOSCOPY PROCEDURE REPORT  PATIENT:  Kenneth, [name]  MR#:  [id number] BIRTHDATE:  1955-10-21, 56 yrs. old  GENDER:  male ENDOSCOPIST:  Iva Boop, MD, [facility]  PROCEDURE DATE:  11/12/2011 PROCEDURE:  Colonoscopy 19147 ASA CLASS:  Class II INDICATIONS:  surveillance and high-risk screening, history of polyps 2007 - 4 mm rectal polyp removed, not recovered MEDICATIONS:   These medications were titrated to patient response per physician's verbal order, MAC sedation, administered by CRNA, propofol (Diprivan) 220 mg IV  DESCRIPTION OF PROCEDURE:   After the risks benefits and alternatives of the procedure were thoroughly explained, informed consent was obtained.  Digital rectal exam was performed and revealed no abnormalities and normal prostate.   The LB CF-[id number] endoscope was introduced through the anus and advanced to the cecum, which was identified by both the appendix and ileocecal valve, without limitations.  The quality of the prep was excellent, using MoviPrep.  The instrument was then slowly withdrawn as the colon was fully examined. <<PROCEDUREIMAGES>>  FINDINGS:  A normal appearing cecum, ileocecal valve, and appendiceal orifice were identified. The ascending, hepatic flexure, transverse, splenic flexure, descending, sigmoid colon, and rectum appeared unremarkable.   Retroflexed views in the rectum revealed internal and external hemorrhoids.    The time to cecum = 1:38 minutes. The scope was then withdrawn in 6:46 minutes from the cecum and the procedure completed. COMPLICATIONS:  None ENDOSCOPIC IMPRESSION: 1) Normal colon 2) Internal and external hemorrhoids  REPEAT EXAM:  In 10 year(s) for routine screening colonoscopy. 2023  Iva Boop, MD, [facility]  CC:  Pecola Lawless, MD and The Patient  n. eSIGNED:   Iva Boop at 11/12/2011 12:04 PM  Laverle Patter, [phone]

## 2011-11-12 NOTE — Progress Notes (Signed)
The pt tolerated the colonoscopy very well. Maw   

## 2011-11-12 NOTE — Patient Instructions (Addendum)
No polyps today! You do have some small hemorrhoids.  Please read the information we have provided.  Thank you for choosing me and [facility].  Iva Boop, MD, FACG   YOU HAD AN ENDOSCOPIC PROCEDURE TODAY AT THE [facility]: Refer to the procedure report that was given to you for any specific questions about what was found during the examination.  If the procedure report does not answer your questions, please call your gastroenterologist to clarify.  If you requested that your care partner not be given the details of your procedure findings, then the procedure report has been included in a sealed envelope for you to review at your convenience later.  YOU SHOULD EXPECT: Some feelings of bloating in the abdomen. Passage of more gas than usual.  Walking can help get rid of the air that was put into your GI tract during the procedure and reduce the bloating. If you had a lower endoscopy (such as a colonoscopy or flexible sigmoidoscopy) you may notice spotting of blood in your stool or on the toilet paper. If you underwent a bowel prep for your procedure, then you may not have a normal bowel movement for a few days.  DIET: Your first meal following the procedure should be a light meal and then it is ok to progress to your normal diet.  A half-sandwich or bowl of soup is an example of a good first meal.  Heavy or fried foods are harder to digest and may make you feel nauseous or bloated.  Likewise meals heavy in dairy and vegetables can cause extra gas to form and this can also increase the bloating.  Drink plenty of fluids but you should avoid alcoholic beverages for 24 hours.  ACTIVITY: Your care partner should take you home directly after the procedure.  You should plan to take it easy, moving slowly for the rest of the day.  You can resume normal activity the day after the procedure however you should NOT DRIVE or use heavy machinery for 24 hours (because of the sedation  medicines used during the test).    SYMPTOMS TO REPORT IMMEDIATELY: A gastroenterologist can be reached at any hour.  During normal business hours, 8:30 AM to 5:00 PM Monday through Friday, call [phone].  After hours and on weekends, please call the GI answering service at [phone] who will take a message and have the physician on call contact you.   Following lower endoscopy (colonoscopy or flexible sigmoidoscopy):  Excessive amounts of blood in the stool  Significant tenderness or worsening of abdominal pains  Swelling of the abdomen that is new, acute  Fever of 100F or higher  Following upper endoscopy (EGD)  Vomiting of blood or coffee ground material  New chest pain or pain under the shoulder blades  Painful or persistently difficult swallowing  New shortness of breath  Fever of 100F or higher  Black, tarry-looking stools  FOLLOW UP: If any biopsies were taken you will be contacted by phone or by letter within the next 1-3 weeks.  Call your gastroenterologist if you have not heard about the biopsies in 3 weeks.  Our staff will call the home number listed on your records the next business day following your procedure to check on you and address any questions or concerns that you may have at that time regarding the information given to you following your procedure. This is a courtesy call and so if there is no answer at the home  number and we have not heard from you through the emergency physician on call, we will assume that you have returned to your regular daily activities without incident.  SIGNATURES/CONFIDENTIALITY: You and/or your care partner have signed paperwork which will be entered into your electronic medical record.  These signatures attest to the fact that that the information above on your After Visit Summary has been reviewed and is understood.  Full responsibility of the confidentiality of this discharge information lies with you and/or your care-partner.     Hemorrhoid information given.   Repeat colonoscopy in 10 years, 2023.

## 2011-11-13 ENCOUNTER — Telehealth: Payer: Self-pay | Admitting: *Deleted

## 2011-11-13 NOTE — Telephone Encounter (Signed)
No answer, message left for the patient. 

## 2011-12-03 ENCOUNTER — Telehealth: Payer: Self-pay | Admitting: Internal Medicine

## 2011-12-03 NOTE — Telephone Encounter (Signed)
Opened in error

## 2011-12-04 ENCOUNTER — Other Ambulatory Visit: Payer: Self-pay | Admitting: Internal Medicine

## 2011-12-31 ENCOUNTER — Other Ambulatory Visit: Payer: Self-pay | Admitting: Internal Medicine

## 2011-12-31 NOTE — Telephone Encounter (Signed)
Dilantin Level 995.20

## 2012-01-01 ENCOUNTER — Ambulatory Visit (INDEPENDENT_AMBULATORY_CARE_PROVIDER_SITE_OTHER): Payer: Federal, State, Local not specified - PPO | Admitting: Internal Medicine

## 2012-01-01 ENCOUNTER — Encounter: Payer: Self-pay | Admitting: Internal Medicine

## 2012-01-01 VITALS — BP 128/84 | HR 79 | Temp 98.3°F | Resp 12 | Ht 69.0 in | Wt 199.6 lb

## 2012-01-01 DIAGNOSIS — Z23 Encounter for immunization: Secondary | ICD-10-CM

## 2012-01-01 DIAGNOSIS — Z0000 Encounter for general adult medical examination without abnormal findings: Secondary | ICD-10-CM

## 2012-01-01 LAB — CBC WITH DIFFERENTIAL/PLATELET
Basophils Relative: 0.2 % (ref 0.0–3.0)
Eosinophils Absolute: 0.1 10*3/uL (ref 0.0–0.7)
Eosinophils Relative: 1.3 % (ref 0.0–5.0)
HCT: 46.1 % (ref 39.0–52.0)
Lymphs Abs: 1 10*3/uL (ref 0.7–4.0)
MCHC: 33.7 g/dL (ref 30.0–36.0)
MCV: 99.5 fl (ref 78.0–100.0)
Monocytes Absolute: 0.4 10*3/uL (ref 0.1–1.0)
Neutrophils Relative %: 74.3 % (ref 43.0–77.0)
Platelets: 209 10*3/uL (ref 150.0–400.0)
RBC: 4.64 Mil/uL (ref 4.22–5.81)
WBC: 5.6 10*3/uL (ref 4.5–10.5)

## 2012-01-01 LAB — TSH: TSH: 1.06 u[IU]/mL (ref 0.35–5.50)

## 2012-01-01 NOTE — Patient Instructions (Addendum)
Preventive Health Care: Exercise at least 30-45 minutes a day,  3-4 days a week if cleared by Cardiology.  Eat a low-fat diet with lots of fruits and vegetables, up to 7-9 servings per day.  Consume less than 40 grams of sugar per day from foods & drinks with High Fructose Corn Sugar as # 1,2,3 or # 4 on label.  If you activate My Chart; the results can be released to you as soon as they populate from the lab. If you choose not to use this program; the labs have to be reviewed, copied & mailed   causing a delay in getting the results to you.

## 2012-01-01 NOTE — Progress Notes (Signed)
Subjective:    Patient ID: Kenneth [name], male    DOB: 1955/05/25, 56 y.o.   MRN: [id number]  HPI [name]  is here for a physical; he denies active or acute issues .      Review of Systems He exercises as walking daily for short distances as well as yardwork. He's tried to follow a heart healthy diet. He is stop his statin; he cannot remember to take it at night.  He denies chest pain, palpitations, dyspnea, edema, or claudication.  His colonoscopy was performed in July 2013. It was negative and repeat was recommended in 2023.     Objective:   Physical Exam  Gen.: Healthy and well-nourished in appearance. Alert, appropriate and cooperative throughout exam. Head: Normocephalic without obvious abnormalities; pattern alopecia ; beard & moustache  Eyes: No corneal or conjunctival inflammation noted. Pupils equal round reactive to light and accommodation. Fundal exam is benign without hemorrhages, exudate, papilledema. Extraocular motion intact. Vision grossly normal. Ears: External  ear exam reveals no significant lesions or deformities. Canals clear .TMs normal. Hearing is grossly normal bilaterally. Nose: External nasal exam reveals no deformity or inflammation. Nasal mucosa are pink and moist. No lesions or exudates noted.  Mouth: Oral mucosa and oropharynx reveal no lesions or exudates. Teeth in good repair. Neck: No deformities, masses, or tenderness noted. Range of motion & Thyroid normal. Lungs: Normal respiratory effort; chest expands symmetrically. Lungs are clear to auscultation without rales, wheezes, or increased work of breathing. Heart: Normal rate and rhythm. Normal S1 and S2. No gallop, click, or rub. S 4 w/o murmur. Abdomen: Bowel sounds normal; abdomen soft and nontender. No masses, organomegaly. Ventral  hernia noted. Genitalia:Genitalia normal except for left varices   ; colonoscopy 7/13 .                                                                                  Musculoskeletal/extremities: No deformity or scoliosis noted of  the thoracic or lumbar spine. No clubbing, cyanosis, edema, or deformity noted. Range of motion  normal .Tone & strength  normal.Joints normal. Nail health  good. Vascular: Carotid, radial artery, dorsalis pedis and  posterior tibial pulses are full and equal. No bruits present. Neurologic: Alert and oriented x3. Deep tendon reflexes symmetrical and normal.         Skin: Intact without suspicious lesions or rashes. Lymph: No cervical, axillary, or inguinal lymphadenopathy present. Psych: Mood and affect are normal. Normally interactive                                                                                        Assessment & Plan:  #1 comprehensive physical exam; no acute findings #2 see Problem List with Assessments & Recommendations  #3 he has been unable to remember the nighttime statin; advanced cholesterol testing indicates LDL  goal is less than 100. EKG reveals nonspecific ST-T wave changes. Questionably these may have progressed in leads V4-6. Plan: see Orders . Because of the nonspecific changes which might be progressive and cholesterol list; I do recommend cardiology evaluation.

## 2012-01-02 LAB — LIPID PANEL
Cholesterol: 272 mg/dL — ABNORMAL HIGH (ref 0–200)
HDL: 67.5 mg/dL (ref >39.00)
VLDL: 27.2 mg/dL (ref 0.0–40.0)

## 2012-01-02 LAB — HEPATIC FUNCTION PANEL
AST: 25 U/L (ref 0–37)
Albumin: 4.5 g/dL (ref 3.5–5.2)
Alkaline Phosphatase: 66 U/L (ref 39–117)
Total Protein: 7.1 g/dL (ref 6.0–8.3)

## 2012-01-02 LAB — BASIC METABOLIC PANEL
BUN: 16 mg/dL (ref 6–23)
CO2: 23 mEq/L (ref 19–32)
Chloride: 106 mEq/L (ref 96–112)
Creatinine, Ser: 0.9 mg/dL (ref 0.4–1.5)
Potassium: 3.7 mEq/L (ref 3.5–5.1)

## 2012-01-03 LAB — LDL CHOLESTEROL, DIRECT: Direct LDL: 185.7 mg/dL

## 2012-01-06 ENCOUNTER — Telehealth: Payer: Self-pay

## 2012-01-06 MED ORDER — SIMVASTATIN 20 MG PO TABS: 20.0000 mg | ORAL_TABLET | Freq: Every evening | ORAL | Status: DC

## 2012-01-06 NOTE — Telephone Encounter (Signed)
Message copied by Maurice Small on Mon Jan 06, 2012  8:20 AM ------      Message from: Pecola Lawless      Created: Sat Jan 04, 2012 10:44 AM       Please send new  Rx for Simvastatin 20mg  # 90

## 2012-01-06 NOTE — Telephone Encounter (Signed)
RX sent per MD request.

## 2012-01-24 ENCOUNTER — Ambulatory Visit (INDEPENDENT_AMBULATORY_CARE_PROVIDER_SITE_OTHER): Payer: Federal, State, Local not specified - PPO | Admitting: Cardiology

## 2012-01-24 ENCOUNTER — Encounter: Payer: Self-pay | Admitting: Cardiology

## 2012-01-24 VITALS — BP 128/80 | HR 67 | Ht 70.0 in | Wt 199.8 lb

## 2012-01-24 DIAGNOSIS — E785 Hyperlipidemia, unspecified: Secondary | ICD-10-CM

## 2012-01-24 DIAGNOSIS — R9431 Abnormal electrocardiogram [ECG] [EKG]: Secondary | ICD-10-CM

## 2012-01-24 NOTE — Patient Instructions (Addendum)
Your physician recommends that you schedule a follow-up appointment in: AS NEEDED  

## 2012-01-24 NOTE — Progress Notes (Signed)
HPI: pleasant 56 year old male for evaluation of abnormal electrocardiogram. The patient does not have dyspnea on exertion, orthopnea, PND, pedal edema, palpitations, syncope or exertional chest pain. He has had an abnormal electrocardiogram for years. In June of 2001 the patient was seen by Dr. Elease Hashimoto for evaluation of an abnormal ECG. There was an incomplete right bundle branch block with inferior Q waves and a prior inferior infarct could not be excluded. At that time he had a stress Cardiolite that showed an ejection fraction of 71% and normal perfusion. Followup electrocardiograms are similar. Because of his abnormal ECG we were asked to evaluate.  Current Outpatient Prescriptions  Medication Sig Dispense Refill  . ALPRAZolam (XANAX) 0.5 MG tablet TAKE 1 TABLET BY MOUTH ONCE DAILY AS NEEDED  30 tablet  0  . Ascorbic Acid (VITAMIN C) 1000 MG tablet Take 1,000 mg by mouth daily.        Marland Kitchen aspirin 81 MG tablet Take 81 mg by mouth daily.      . calcium carbonate (OS-CAL) 600 MG TABS Take 1,200 mg by mouth daily.       . Cholecalciferol (VITAMIN D3) 2000 UNITS TABS Take 1,000 Units by mouth daily.       . cyanocobalamin 500 MCG tablet Take 1,000 mcg by mouth daily.       Marland Kitchen DILANTIN 100 MG ER capsule TAKE 5 CAPSULES BY MOUTH DAILY  450 capsule  0  . fexofenadine (ALLEGRA) 180 MG tablet Take 180 mg by mouth daily.      Boris Lown Oil 1500 MG CAPS Take 300 mg by mouth daily.      . NON FORMULARY Joint Care-take one daily      . Omega-3 Fatty Acids (FISH OIL) 300 MG CAPS Take by mouth. (Mega Red)       . simvastatin (ZOCOR) 20 MG tablet Take 1 tablet (20 mg total) by mouth every evening.  90 tablet  0   Current Facility-Administered Medications  Medication Dose Route Frequency Provider Last Rate Last Dose  . DISCONTD: 0.9 %  sodium chloride infusion  500 mL Intravenous Continuous Iva Boop, MD        No Known Allergies  Past Medical History  Diagnosis Date  . Syncope     in 20s; no  etiology, no recurrence. Dilantin Rxed  . Hyperlipidemia   . Allergy   . Hx of colonic polyp 2007    Past Surgical History  Procedure Date  . Colonoscopy with polypectomy 2007    Dr Leone Payor, 4 mm rectal polyp  . Nasal fracture surgery   . Tonsillectomy and adenoidectomy   . Colonoscopy 07/13    neg ; due 2023    History   Social History  . Marital Status: Married    Spouse Name: N/A    Number of Children: N/A  . Years of Education: N/A   Occupational History  .      Property management   Social History Main Topics  . Smoking status: Never Smoker   . Smokeless tobacco: Never Used  . Alcohol Use: 3.6 - 4.8 oz/week    6-8 Cans of beer per week  . Drug Use: No  . Sexually Active: Not on file   Other Topics Concern  . Not on file   Social History Narrative  . No narrative on file    Family History  Problem Relation Age of Onset  . Macular degeneration Father   . Macular degeneration Paternal Uncle   .  Cancer Mother     throat  . Colon cancer Neg Hx   . Esophageal cancer Neg Hx   . Stomach cancer Neg Hx   . Rectal cancer      maternal cousin  . Stroke Neg Hx   . Heart disease Neg Hx   . Diabetes Neg Hx     ROS: no fevers or chills, productive cough, hemoptysis, dysphasia, odynophagia, melena, hematochezia, dysuria, hematuria, rash, seizure activity, orthopnea, PND, pedal edema, claudication. Remaining systems are negative.  Physical Exam:   Blood pressure 128/80, pulse 67, height 5\' 10"  (1.778 m), weight 199 lb 12.8 oz (90.629 kg).  General:  Well developed/well nourished in NAD Skin warm/dry Patient not depressed No peripheral clubbing Back-normal HEENT-normal/normal eyelids Neck supple/normal carotid upstroke bilaterally; no bruits; no JVD; no thyromegaly chest - CTA/ normal expansion CV - RRR/normal S1 and S2; no murmurs, rubs or gallops;  PMI nondisplaced Abdomen -NT/ND, no HSM, no mass, + bowel sounds, no bruit 2+ femoral pulses, no  bruits Ext-no edema, chords, 2+ DP Neuro-grossly nonfocal  ECG 01/01/2012-sinus bradycardia at a rate of 58. Incomplete right bundle branch block. Prior inferior infarct.

## 2012-01-24 NOTE — Assessment & Plan Note (Signed)
Continue statin. Managed her primary care.

## 2012-01-24 NOTE — Assessment & Plan Note (Signed)
Patient's electrocardiogram and his old records/ECG have been reviewed. He has an incomplete right bundle branch block and a prior inferior infarct cannot be excluded. He has no symptoms and his electrocardiogram is unchanged compared to previous. Previous nuclear study for an identical electrocardiogram showed normal LV function and normal perfusion. I do not think further cardiac evaluation is indicated.

## 2012-01-29 ENCOUNTER — Other Ambulatory Visit: Payer: Self-pay | Admitting: Internal Medicine

## 2012-03-11 ENCOUNTER — Other Ambulatory Visit: Payer: Self-pay | Admitting: Internal Medicine

## 2012-03-11 DIAGNOSIS — E785 Hyperlipidemia, unspecified: Secondary | ICD-10-CM

## 2012-03-11 DIAGNOSIS — T887XXA Unspecified adverse effect of drug or medicament, initial encounter: Secondary | ICD-10-CM

## 2012-03-11 MED ORDER — SIMVASTATIN 20 MG PO TABS: ORAL_TABLET | ORAL | Status: DC

## 2012-03-11 MED ORDER — PHENYTOIN SODIUM EXTENDED 100 MG PO CAPS: ORAL_CAPSULE | ORAL | Status: DC

## 2012-03-11 NOTE — Telephone Encounter (Signed)
Side note: Future orders placed, patient due for labs

## 2012-03-11 NOTE — Telephone Encounter (Signed)
refills x 2 **NOTE this is the from [facility]   1-Dilantin 100mg  capsules TK 5 CS PO D #450, Last fill 9.16.13  2-Simvastatin 20mg  Tablets TK 1 T PO QD #90, last fill 9.16.13

## 2012-03-23 ENCOUNTER — Other Ambulatory Visit (INDEPENDENT_AMBULATORY_CARE_PROVIDER_SITE_OTHER): Payer: Federal, State, Local not specified - PPO

## 2012-03-23 DIAGNOSIS — E785 Hyperlipidemia, unspecified: Secondary | ICD-10-CM

## 2012-03-23 DIAGNOSIS — T887XXA Unspecified adverse effect of drug or medicament, initial encounter: Secondary | ICD-10-CM

## 2012-03-23 LAB — HEPATIC FUNCTION PANEL
ALT: 70 U/L — ABNORMAL HIGH (ref 0–53)
AST: 45 U/L — ABNORMAL HIGH (ref 0–37)
Total Bilirubin: 0.6 mg/dL (ref 0.3–1.2)
Total Protein: 7.1 g/dL (ref 6.0–8.3)

## 2012-03-23 LAB — LIPID PANEL
Cholesterol: 214 mg/dL — ABNORMAL HIGH (ref 0–200)
HDL: 71.6 mg/dL (ref >39.00)
Triglycerides: 179 mg/dL — ABNORMAL HIGH (ref 0.0–149.0)

## 2012-03-23 LAB — CK: Total CK: 69 U/L (ref 7–232)

## 2012-03-23 LAB — LDL CHOLESTEROL, DIRECT: Direct LDL: 124.7 mg/dL

## 2012-03-24 LAB — PHENYTOIN LEVEL, TOTAL: Phenytoin Lvl: 12.1 ug/mL (ref 10.0–20.0)

## 2012-05-01 ENCOUNTER — Encounter: Payer: Self-pay | Admitting: *Deleted

## 2012-05-01 ENCOUNTER — Encounter: Payer: Self-pay | Admitting: Internal Medicine

## 2012-05-01 ENCOUNTER — Ambulatory Visit (INDEPENDENT_AMBULATORY_CARE_PROVIDER_SITE_OTHER): Payer: Federal, State, Local not specified - PPO | Admitting: Internal Medicine

## 2012-05-01 VITALS — BP 128/80 | HR 67 | Wt 205.0 lb

## 2012-05-01 DIAGNOSIS — R7402 Elevation of levels of lactic acid dehydrogenase (LDH): Secondary | ICD-10-CM

## 2012-05-01 DIAGNOSIS — R7401 Elevation of levels of liver transaminase levels: Secondary | ICD-10-CM

## 2012-05-01 DIAGNOSIS — E785 Hyperlipidemia, unspecified: Secondary | ICD-10-CM

## 2012-05-01 DIAGNOSIS — N6019 Diffuse cystic mastopathy of unspecified breast: Secondary | ICD-10-CM

## 2012-05-01 MED ORDER — ROSUVASTATIN CALCIUM 20 MG PO TABS: 20.0000 mg | ORAL_TABLET | Freq: Every day | ORAL | Status: DC

## 2012-05-01 MED ORDER — ALPRAZOLAM 0.5 MG PO TABS: ORAL_TABLET | ORAL | Status: DC

## 2012-05-01 MED ORDER — DILANTIN 100 MG PO CAPS: ORAL_CAPSULE | ORAL | Status: DC

## 2012-05-01 NOTE — Patient Instructions (Addendum)
Caffeine ( coffee, tea, cola, chocolate ) can increase fibrocystic changes. Avoid thse to excess as much as  possible. Consider vitamin E 400 international units daily if having active symptoms.  The most common cause of elevated triglycerides is the ingestion of sugar from high fructose corn syrup sources added to processed foods & drinks.  Eat a low-fat diet with lots of fruits and vegetables, up to 7-9 servings per day. Consume less than 40 ( preferably ZERO) grams of sugar per day from foods & drinks with High Fructose Corn Syrup (HFCS) sugar as #1,2,3 or # 4 on label.Whole [employer], [employer] & [employer] do not carry products with HFCS. Please  schedule fasting Labs  After 10-11 weeks of Crestor 20 mg: CK,Lipids, hepatic panel. PLEASE BRING THESE INSTRUCTIONS TO FOLLOW UP  LAB APPOINTMENT.This will guarantee correct labs are drawn, eliminating need for repeat blood sampling ( needle sticks ! ). Diagnoses /Codes: 409.8,119.14

## 2012-05-01 NOTE — Progress Notes (Signed)
  Subjective:    Patient ID: Kenneth [name], male    DOB: [birth year], 57 y.o.   MRN: [id number]  HPI Dyslipidemia assessment: Prior Advanced Lipid Testing: LDL goal = < 100.   Family history of premature CAD/ MI: P uncle ? CAD  .  Nutrition: increased red meat; decreased HFCS .  Exercise: off X few weeks due to foot injury . Diabetes : A1c 5.3% in 2012 . HTN: no. Smoking history  :never .    Lab results reviewed :LDL 124.7 on Simvastatin 20 mg; TG 179    Review of Systems Weight :  Up few #. ROS: fatigue:no ; chest pain : no ;claudication: no; palpitations: no; abd pain/bowel changes: no ; myalgias:no;  syncope : no ; memory loss: no;skin changes:no.  He noted some mild changes in the breasts incidentally. He does drink 2 cups of coffee a day. He has been drinking [place] Mist in place of high fructose corn syrup containing sodas. He is not on spironolactone     Objective:   Physical Exam He appears healthy and well-nourished; he is in no acute distress  Thyroid normal to palpation without nodularity or enlargement  No carotid bruits are present.  Heart rhythm and rate are normal with no significant murmurs or gallops.  Chest is clear with no increased work of breathing  Mild fibrocystic breast changes, left greater than right  There is no evidence of aortic aneurysm or renal artery bruits  He has no clubbing or edema.   Pedal pulses are intact   No ischemic skin changes are present         Assessment & Plan:  #1 dyslipidemia; main etiology is genetic. Intermittently elevated triglycerides suggest some optimal nutritional component. Avoidance of high fructose corn syrup discussed. I would recommend Crestor 20 mg daily with followup lipids after 10 weeks.  #2 elevated liver enzymes suggestive of hepatic steatosis. Hepatic enzymes should normalize with reduction in triglycerides. He should also avoid excess vitamin A and Tylenol  #3 minor fibrocystic breast  changes. Excess caffeine (coffee tea, chocolate) should be avoided. Vitamin E 400 international units daily may be prophylactic

## 2012-05-21 ENCOUNTER — Encounter: Payer: Self-pay | Admitting: Internal Medicine

## 2012-07-24 ENCOUNTER — Other Ambulatory Visit (INDEPENDENT_AMBULATORY_CARE_PROVIDER_SITE_OTHER): Payer: Federal, State, Local not specified - PPO

## 2012-07-24 DIAGNOSIS — T887XXA Unspecified adverse effect of drug or medicament, initial encounter: Secondary | ICD-10-CM

## 2012-07-24 DIAGNOSIS — E785 Hyperlipidemia, unspecified: Secondary | ICD-10-CM

## 2012-07-24 LAB — HEPATIC FUNCTION PANEL
AST: 45 U/L — ABNORMAL HIGH (ref 0–37)
Bilirubin, Direct: 0 mg/dL (ref 0.0–0.3)
Total Bilirubin: 0.5 mg/dL (ref 0.3–1.2)

## 2012-07-24 LAB — LIPID PANEL
HDL: 63.4 mg/dL (ref >39.00)
Total CHOL/HDL Ratio: 3
VLDL: 31 mg/dL (ref 0.0–40.0)

## 2012-07-27 ENCOUNTER — Telehealth: Payer: Self-pay | Admitting: *Deleted

## 2012-07-27 DIAGNOSIS — E785 Hyperlipidemia, unspecified: Secondary | ICD-10-CM

## 2012-07-27 MED ORDER — ROSUVASTATIN CALCIUM 20 MG PO TABS: 20.0000 mg | ORAL_TABLET | Freq: Every day | ORAL | Status: DC

## 2012-07-27 NOTE — Telephone Encounter (Signed)
Pt would like to know if he needs to continue with his crestor. Pt recently had labs done but does not know if he need to still take med.Please advise

## 2012-07-27 NOTE — Telephone Encounter (Signed)
RX sent electronically 

## 2012-07-27 NOTE — Telephone Encounter (Signed)
Yes--#90 °

## 2012-10-20 ENCOUNTER — Other Ambulatory Visit: Payer: Self-pay | Admitting: Internal Medicine

## 2012-10-21 NOTE — Telephone Encounter (Signed)
Labs completed, pending appointment with MD

## 2012-12-09 ENCOUNTER — Other Ambulatory Visit: Payer: Self-pay | Admitting: Internal Medicine

## 2012-12-09 NOTE — Telephone Encounter (Signed)
LM @ (5:10pm) asking the pt to RTC regarding refill request.//AB/CMA

## 2012-12-10 NOTE — Telephone Encounter (Signed)
Spoke with the pt and informed him that the med he requested is ready,but he will need to pick it up and leave a urine for a UDS.  Pt understood and agreed.//AB/CMA

## 2013-02-25 ENCOUNTER — Other Ambulatory Visit: Payer: Self-pay

## 2013-03-24 ENCOUNTER — Encounter: Payer: Self-pay | Admitting: Internal Medicine

## 2013-04-29 ENCOUNTER — Encounter: Payer: Self-pay | Admitting: Internal Medicine

## 2013-04-30 ENCOUNTER — Other Ambulatory Visit: Payer: Self-pay | Admitting: Internal Medicine

## 2013-05-03 NOTE — Telephone Encounter (Signed)
Dilantin refilled per protocol. JG//CMA

## 2013-05-21 ENCOUNTER — Ambulatory Visit (INDEPENDENT_AMBULATORY_CARE_PROVIDER_SITE_OTHER): Payer: Federal, State, Local not specified - PPO | Admitting: Internal Medicine

## 2013-05-21 ENCOUNTER — Encounter: Payer: Self-pay | Admitting: Internal Medicine

## 2013-05-21 VITALS — BP 150/85 | HR 87 | Temp 98.5°F | Ht 68.25 in | Wt 203.8 lb

## 2013-05-21 DIAGNOSIS — E785 Hyperlipidemia, unspecified: Secondary | ICD-10-CM

## 2013-05-21 DIAGNOSIS — Z0000 Encounter for general adult medical examination without abnormal findings: Secondary | ICD-10-CM

## 2013-05-21 MED ORDER — DILANTIN 100 MG PO CAPS: ORAL_CAPSULE | ORAL | Status: DC

## 2013-05-21 NOTE — Progress Notes (Signed)
Pre visit review using our clinic review tool, if applicable. No additional management support is needed unless otherwise documented below in the visit note. 

## 2013-05-21 NOTE — Patient Instructions (Signed)
Your next office appointment will be determined based upon review of your pending labs . Those instructions will be transmitted to you through My Chart  . Perform the exercises for elbow pain  twice a day as discussed. Apply the anti-inflammatory gel after the exercises such as Aspercreme or Zostrix cream twice a day to the affected area as needed. In lieu of this warm moist compresses or  hot water bottle can be used. Do not apply ice .

## 2013-05-21 NOTE — Progress Notes (Signed)
   Subjective:    Patient ID: Kenneth [name], male    DOB: 1956-02-26, 58 y.o.   MRN: [id number]  HPI  He  is here for a physical;acute issues L elbow pain from repetitive activity. A compression sleeve helps.     Review of Systems A modified heart healthy diet is followed; exercise encompasses  30 minutes 4 times per week as  Treadmill without symptoms.  Famiy history is negative  for premature coronary disease. Advanced cholesterol testing reveals  LDL goal is less than 100 ; ideally < 70 . There is medication compliance with the statin.  Low dose ASA taken Specifically denied are  chest pain, palpitations, dyspnea, or claudication.  Significant abdominal symptoms, memory deficit, or myalgias not present.     Objective:   Physical Exam  Gen.: Healthy and well-nourished in appearance. Alert, appropriate and cooperative throughout exam.  Head: Normocephalic without obvious abnormalities;  pattern alopecia . Beard & moustache Eyes: No corneal or conjunctival inflammation noted. Pupils equal round reactive to light and accommodation. Extraocular motion intact. Fundal exam is benign without hemorrhages, exudate, papilledema.  Ptosis Ears: External  ear exam reveals no significant lesions or deformities. Canals clear .TMs normal. Hearing is grossly slightly decreased on L Nose: External nasal exam reveals no deformity or inflammation. Nasal mucosa are pink and moist. No lesions or exudates noted.  Mouth: Oral mucosa and oropharynx reveal no lesions or exudates. Teeth in good repair. Neck: No deformities, masses, or tenderness noted. Range of motion & Thyroid normal. Lungs: Normal respiratory effort; chest expands symmetrically. Lungs are clear to auscultation without rales, wheezes, or increased work of breathing. Heart: Normal rate and rhythm. Normal S1 and S2. No gallop, click, or rub. No murmur. Abdomen: Bowel sounds normal; abdomen soft and nontender. No masses, organomegaly or  hernias noted. Genitalia: Genitalia normal except for left varices & R epididymal scar tissue. Prostate is normal without enlargement, asymmetry, nodularity, or induration                                   Musculoskeletal/extremities: No deformity or scoliosis noted of  the thoracic or lumbar spine.   No clubbing, cyanosis, edema, or significant extremity  deformity noted. Range of motion normal .Tone & strength normal. Hand joints normal . Fingernail health good. Able to lie down & sit up w/o help. Negative SLR bilaterally Vascular: Carotid, radial artery, dorsalis pedis and  posterior tibial pulses are full and equal. No bruits present. Neurologic: Alert and oriented x3. Deep tendon reflexes symmetrical and normal.        Skin: Intact without suspicious lesions or rashes. Lymph: No cervical, axillary, or inguinal lymphadenopathy present. Psych: Mood and affect are normal. Normally interactive                                                                                        Assessment & Plan:  #1 comprehensive physical exam; no acute findings  Plan: see Orders  & Recommendations

## 2013-05-25 ENCOUNTER — Other Ambulatory Visit (INDEPENDENT_AMBULATORY_CARE_PROVIDER_SITE_OTHER): Payer: Federal, State, Local not specified - PPO

## 2013-05-25 DIAGNOSIS — Z0000 Encounter for general adult medical examination without abnormal findings: Secondary | ICD-10-CM

## 2013-05-25 LAB — CBC WITH DIFFERENTIAL/PLATELET
BASOS PCT: 0.4 % (ref 0.0–3.0)
Basophils Absolute: 0 10*3/uL (ref 0.0–0.1)
EOS PCT: 2.7 % (ref 0.0–5.0)
Eosinophils Absolute: 0.2 10*3/uL (ref 0.0–0.7)
HEMATOCRIT: 47.5 % (ref 39.0–52.0)
Hemoglobin: 16.1 g/dL (ref 13.0–17.0)
LYMPHS ABS: 1.5 10*3/uL (ref 0.7–4.0)
Lymphocytes Relative: 23.4 % (ref 12.0–46.0)
MCHC: 33.8 g/dL (ref 30.0–36.0)
MCV: 99.9 fl (ref 78.0–100.0)
MONO ABS: 0.7 10*3/uL (ref 0.1–1.0)
Monocytes Relative: 10.6 % (ref 3.0–12.0)
Neutro Abs: 4 10*3/uL (ref 1.4–7.7)
Neutrophils Relative %: 62.9 % (ref 43.0–77.0)
PLATELETS: 219 10*3/uL (ref 150.0–400.0)
RBC: 4.76 Mil/uL (ref 4.22–5.81)
RDW: 12.8 % (ref 11.5–14.6)
WBC: 6.3 10*3/uL (ref 4.5–10.5)

## 2013-05-25 LAB — BASIC METABOLIC PANEL
BUN: 19 mg/dL (ref 6–23)
CHLORIDE: 105 meq/L (ref 96–112)
CO2: 26 mEq/L (ref 19–32)
CREATININE: 0.8 mg/dL (ref 0.4–1.5)
Calcium: 8.9 mg/dL (ref 8.4–10.5)
GFR: 99.19 mL/min (ref >60.00)
Glucose, Bld: 105 mg/dL — ABNORMAL HIGH (ref 70–99)
Potassium: 3.7 mEq/L (ref 3.5–5.1)
Sodium: 137 mEq/L (ref 135–145)

## 2013-05-25 LAB — LIPID PANEL
CHOL/HDL RATIO: 3
Cholesterol: 173 mg/dL (ref 0–200)
HDL: 66.6 mg/dL (ref >39.00)
LDL CALC: 78 mg/dL (ref 0–99)
Triglycerides: 140 mg/dL (ref 0.0–149.0)
VLDL: 28 mg/dL (ref 0.0–40.0)

## 2013-05-25 LAB — HEPATIC FUNCTION PANEL
ALBUMIN: 4 g/dL (ref 3.5–5.2)
ALT: 61 U/L — ABNORMAL HIGH (ref 0–53)
AST: 34 U/L (ref 0–37)
Alkaline Phosphatase: 74 U/L (ref 39–117)
Bilirubin, Direct: 0 mg/dL (ref 0.0–0.3)
Total Bilirubin: 0.8 mg/dL (ref 0.3–1.2)
Total Protein: 6.5 g/dL (ref 6.0–8.3)

## 2013-05-25 LAB — TSH: TSH: 1.49 u[IU]/mL (ref 0.35–5.50)

## 2013-05-28 ENCOUNTER — Ambulatory Visit: Payer: Federal, State, Local not specified - PPO

## 2013-05-28 DIAGNOSIS — R7309 Other abnormal glucose: Secondary | ICD-10-CM

## 2013-05-28 LAB — VITAMIN D 1,25 DIHYDROXY
Vitamin D 1, 25 (OH)2 Total: 51 pg/mL (ref 18–72)
Vitamin D2 1, 25 (OH)2: <8 pg/mL
Vitamin D3 1, 25 (OH)2: 51 pg/mL

## 2013-05-28 LAB — HEMOGLOBIN A1C: Hgb A1c MFr Bld: 5.4 % (ref 4.6–6.5)

## 2013-08-01 ENCOUNTER — Other Ambulatory Visit: Payer: Self-pay | Admitting: Internal Medicine

## 2013-08-02 NOTE — Telephone Encounter (Signed)
Last OV 05/11/13

## 2013-08-02 NOTE — Telephone Encounter (Signed)
Alprazolam called to pharmacy  

## 2013-08-05 ENCOUNTER — Ambulatory Visit (INDEPENDENT_AMBULATORY_CARE_PROVIDER_SITE_OTHER): Payer: Federal, State, Local not specified - PPO | Admitting: Internal Medicine

## 2013-08-05 ENCOUNTER — Encounter: Payer: Self-pay | Admitting: Internal Medicine

## 2013-08-05 VITALS — BP 144/81 | HR 66 | Temp 98.0°F | Wt 204.0 lb

## 2013-08-05 DIAGNOSIS — M25529 Pain in unspecified elbow: Secondary | ICD-10-CM

## 2013-08-05 NOTE — Progress Notes (Signed)
Pre visit review using our clinic review tool, if applicable. No additional management support is needed unless otherwise documented below in the visit note. 

## 2013-08-05 NOTE — Progress Notes (Signed)
Subjective:    Patient ID: Kenneth [name], male    DOB: 08/19/1955, 58 y.o.   MRN: [id number]  DOS:  08/05/2013 Type of  visit: Acute visit 4 months history of left elbow pain, he is very active in the yard and uses his arms a lot but no actual injury. Pain is all around the elbow, not strictly limited to the epicondyle areas Has try a compression sleeve without much help.    ROS No history of gout Admits to some stiffness in the morning. Unable to flex the elbow all the way as he can do with the right arm.  Past Medical History  Diagnosis Date  . Syncope     in 20s; no etiology, no recurrence. Dilantin Rxed  . Hyperlipidemia   . Allergy   . Hx of colonic polyp 2007    Past Surgical History  Procedure Laterality Date  . Colonoscopy with polypectomy  2007    Dr Carlean Purl, 4 mm rectal polyp  . Nasal fracture surgery    . Tonsillectomy and adenoidectomy    . Colonoscopy  07/13    neg ; due 2023    History   Social History  . Marital Status: Married    Spouse Name: N/A    Number of Children: N/A  . Years of Education: N/A   Occupational History  .      Property management   Social History Main Topics  . Smoking status: Never Smoker   . Smokeless tobacco: Never Used  . Alcohol Use: 3.6 - 4.8 oz/week    6-8 Cans of beer per week  . Drug Use: No  . Sexual Activity: Not on file   Other Topics Concern  . Not on file   Social History Narrative  . No narrative on file        Medication List       This list is accurate as of: 08/05/13 10:21 AM.  Always use your most recent med list.               ALPRAZolam 0.5 MG tablet  Commonly known as:  XANAX  TAKE 1 TABLET BY MOUTH EVERY EVENING     aspirin 81 MG tablet  Take 81 mg by mouth daily.     calcium carbonate 600 MG Tabs tablet  Commonly known as:  OS-CAL  Take 1,200 mg by mouth daily.     CRESTOR 20 MG tablet  Generic drug:  rosuvastatin  TAKE 1 TABLET BY MOUTH DAILY     cyanocobalamin  500 MCG tablet  Take 1,000 mcg by mouth daily.     DILANTIN 100 MG ER capsule  Generic drug:  phenytoin  TAKE 5 CAPSULES BY MOUTH DAILY     fexofenadine 180 MG tablet  Commonly known as:  ALLEGRA  Take 180 mg by mouth daily.     Fish Oil 300 MG Caps  Take by mouth. (Mega Red)     fluocinonide gel 0.05 %  Commonly known as:  LIDEX  Apply 1 application topically 3 (three) times daily.     ibuprofen 600 MG tablet  Commonly known as:  ADVIL,MOTRIN  Take 1 tablet by mouth as needed.     NON FORMULARY  Joint Care-take one daily     vitamin C 1000 MG tablet  Take 1,000 mg by mouth daily.     Vitamin D3 2000 UNITS Tabs  Take 1,000 Units by mouth daily.  Objective:   Physical Exam BP 144/81  Pulse 66  Temp(Src) 98 F (36.7 C)  Wt 204 lb (92.534 kg)  SpO2 93% General -- alert, well-developed, NAD.   Extremities--  Right elbow normal Left elbow symmetric, no swelling, not particularly tender at the epicondyles. Bursa is not enlarged. Range of motion is slightly decreased compared to the R?. Biceps on the right is slightly larger, his right handed Neurologic--  alert & oriented X3. Speech normal, gait normal, strength normal in all extremities.  Psych-- Cognition and judgment appear intact. Cooperative with normal attention span and concentration. No anxious or depressed appearing.      Assessment & Plan:  Elbow pain, Pain is not classic for tennis elbow or bursitis. DJD? Plan: Ibuprofen as needed, GI precautions discussed, refer to ortho, Ice

## 2013-08-05 NOTE — Patient Instructions (Signed)
Will refer you to the orthopedic doctor  Motrin 200 mg 2 tablets every 6 hours as needed for pain. Always take it with food because may cause gastritis and ulcers. If you notice nausea, stomach pain, change in the color of stools --->  Stop the medicine and let us know  Ice after exercise   Next visit is for routine check up    in 4 months  No need to come back fasting Please make an appointment

## 2013-10-08 ENCOUNTER — Other Ambulatory Visit: Payer: Self-pay | Admitting: Internal Medicine

## 2013-10-08 MED ORDER — ALPRAZOLAM 0.5 MG PO TABS: ORAL_TABLET | ORAL | Status: DC

## 2013-10-08 NOTE — Telephone Encounter (Signed)
Faxed hardcopy to [facility] [place] 

## 2013-10-08 NOTE — Telephone Encounter (Signed)
Done hardcopy to robin  

## 2013-10-08 NOTE — Telephone Encounter (Signed)
MD out of office, okay for refill?

## 2013-10-08 NOTE — Telephone Encounter (Signed)
Did not received Hardcopy

## 2013-11-12 ENCOUNTER — Other Ambulatory Visit: Payer: Self-pay | Admitting: Internal Medicine

## 2013-12-29 ENCOUNTER — Other Ambulatory Visit: Payer: Self-pay | Admitting: Internal Medicine

## 2013-12-29 NOTE — Telephone Encounter (Signed)
#  30 1 qhs prn

## 2013-12-29 NOTE — Telephone Encounter (Signed)
05/21/13 last office visit

## 2014-03-09 ENCOUNTER — Other Ambulatory Visit: Payer: Self-pay | Admitting: Internal Medicine

## 2014-03-09 NOTE — Telephone Encounter (Signed)
#  30 as directed OV needed before refills Is he staying with Dr Larose Kells?

## 2014-03-09 NOTE — Telephone Encounter (Signed)
Alprazolam has been called to [facility] in [place]

## 2014-04-26 ENCOUNTER — Ambulatory Visit (INDEPENDENT_AMBULATORY_CARE_PROVIDER_SITE_OTHER): Payer: Federal, State, Local not specified - PPO | Admitting: Internal Medicine

## 2014-04-26 ENCOUNTER — Encounter: Payer: Self-pay | Admitting: Internal Medicine

## 2014-04-26 VITALS — BP 136/78 | HR 50 | Temp 98.2°F | Ht 68.0 in | Wt 205.0 lb

## 2014-04-26 DIAGNOSIS — J069 Acute upper respiratory infection, unspecified: Secondary | ICD-10-CM

## 2014-04-26 MED ORDER — AMOXICILLIN 500 MG PO CAPS: 1000.0000 mg | ORAL_CAPSULE | Freq: Two times a day (BID) | ORAL | Status: DC

## 2014-04-26 NOTE — Patient Instructions (Signed)
Rest, fluids , tylenol If  cough, take Mucinex DM twice a day as needed  Use OTC Nasocort or Flonase : 2 nasal sprays on each side of the nose daily until you feel better Allegra is ok Avoid the "D" formulations  Take the antibiotic as prescribed  (Amoxicillin) if no better next week Call if not gradually better over the next  10 days Call anytime if the symptoms are severe  Please schedule a physical within a months (front desk ok to put two 15 min appointments together)

## 2014-04-26 NOTE — Progress Notes (Signed)
Subjective:    Patient ID: Kenneth [name], male    DOB: 1955/10/30, 59 y.o.   MRN: [id number]  DOS:  04/26/2014 Type of visit - description : acute Interval history: Symptoms started 4 weeks ago, on and off: Mild cough with clear sputum production, mild sinus pressure with no  Nasal discharge. He also has bilateral ear pressure, yesterday had some right ear pain. Has taking OTC decongestants that make him feel worse, taking Mucinex as needed   ROS Had low-grade fever with the onset of symptoms. No sore throat. No nausea, vomiting, diarrhea  Past Medical History  Diagnosis Date  . Syncope     in 20s; no etiology, no recurrence. Dilantin Rxed  . Hyperlipidemia   . Allergy   . Hx of colonic polyp 2007    Past Surgical History  Procedure Laterality Date  . Colonoscopy with polypectomy  2007    Dr Carlean Purl, 4 mm rectal polyp  . Nasal fracture surgery    . Tonsillectomy and adenoidectomy    . Colonoscopy  07/13    neg ; due 2023    History   Social History  . Marital Status: Married    Spouse Name: N/A    Number of Children: N/A  . Years of Education: N/A   Occupational History  .      Property management   Social History Main Topics  . Smoking status: Never Smoker   . Smokeless tobacco: Never Used  . Alcohol Use: 3.6 - 4.8 oz/week    6-8 Cans of beer per week  . Drug Use: No  . Sexual Activity: Not on file   Other Topics Concern  . Not on file   Social History Narrative        Medication List       This list is accurate as of: 04/26/14 11:59 PM.  Always use your most recent med list.               ALPRAZolam 0.5 MG tablet  Commonly known as:  XANAX  TAKE 1 TABLET BY MOUTH EVERY NIGHT AT BEDTIME AS NEEDED     amoxicillin 500 MG capsule  Commonly known as:  AMOXIL  Take 2 capsules (1,000 mg total) by mouth 2 (two) times daily.     aspirin 81 MG tablet  Take 81 mg by mouth daily.     calcium carbonate 600 MG Tabs tablet  Commonly known as:   OS-CAL  Take 1,200 mg by mouth daily.     CRESTOR 20 MG tablet  Generic drug:  rosuvastatin  TAKE 1 TABLET BY MOUTH DAILY     cyanocobalamin 500 MCG tablet  Take 1,000 mcg by mouth daily.     DILANTIN 100 MG ER capsule  Generic drug:  phenytoin  TAKE 5 CAPSULES BY MOUTH DAILY     fexofenadine 180 MG tablet  Commonly known as:  ALLEGRA  Take 180 mg by mouth daily.     Fish Oil 300 MG Caps  Take by mouth. (Mega Red)     fluocinonide gel 0.05 %  Commonly known as:  LIDEX  Apply 1 application topically 3 (three) times daily.     ibuprofen 600 MG tablet  Commonly known as:  ADVIL,MOTRIN  Take 1 tablet by mouth as needed.     NON FORMULARY  Joint Care-take one daily     vitamin C 1000 MG tablet  Take 1,000 mg by mouth daily.     Vitamin  D3 2000 UNITS Tabs  Take 1,000 Units by mouth daily.           Objective:   Physical Exam BP 136/78 mmHg  Pulse 50  Temp(Src) 98.2 F (36.8 C) (Oral)  Ht 5\' 8"  (1.727 m)  Wt 205 lb (92.987 kg)  BMI 31.18 kg/m2  SpO2 94% General -- alert, well-developed, NAD.   HEENT-- Not pale.  R Ear-- normal L ear-- normal Throat symmetric, no redness or discharge. Face symmetric, sinuses not tender to palpation. Nose  congested. Lungs -- normal respiratory effort, no intercostal retractions, no accessory muscle use, and normal breath sounds.  Heart-- normal rate, regular rhythm, no murmur.   Extremities-- no pretibial edema bilaterally  Neurologic--  alert & oriented X3. Speech normal, gait appropriate for age, strength symmetric and appropriate for age.  Psych-- Cognition and judgment appear intact. Cooperative with normal attention span and concentration. No anxious or depressed appearing.        Assessment & Plan:  URI Persisting URI symptoms for 4 weeks, will recommend conservative treatment for few more days, if not better , try amoxicillin.  He is also due for a physical, see instructions

## 2014-04-26 NOTE — Progress Notes (Signed)
Pre visit review using our clinic review tool, if applicable. No additional management support is needed unless otherwise documented below in the visit note. 

## 2014-05-26 ENCOUNTER — Telehealth: Payer: Self-pay | Admitting: Internal Medicine

## 2014-05-26 NOTE — Telephone Encounter (Signed)
Last seen 04/2013; appears seeing Dr Larose Kells Would need OV unless Dr Larose Kells willing to refill

## 2014-05-30 MED ORDER — ALPRAZOLAM 0.5 MG PO TABS: 0.5000 mg | ORAL_TABLET | Freq: Every evening | ORAL | Status: DC | PRN

## 2014-05-30 NOTE — Addendum Note (Signed)
Addended by: Wilfrid Lund on: 05/30/2014 12:06 PM   Modules accepted: Orders

## 2014-05-30 NOTE — Telephone Encounter (Signed)
Has an appointment pending with me, please print a #30 Rx

## 2014-05-30 NOTE — Telephone Encounter (Signed)
Faxed to [facility] pharmacy.  

## 2014-05-30 NOTE — Telephone Encounter (Signed)
Rx printed and signed by Dr. Larose Kells.

## 2014-05-30 NOTE — Telephone Encounter (Signed)
Patient calling in requesting a refill of this medication.  Patient has appointment scheduled for 06/02/14 [facility] on [address]

## 2014-05-30 NOTE — Telephone Encounter (Signed)
Pt is requesting refill on Alprazolam.  Last OV: 04/26/2014 Last Fill: 03/09/2014 # 30 0RF  Please advise.

## 2014-06-02 ENCOUNTER — Ambulatory Visit (HOSPITAL_BASED_OUTPATIENT_CLINIC_OR_DEPARTMENT_OTHER)
Admission: RE | Admit: 2014-06-02 | Discharge: 2014-06-02 | Disposition: A | Discharge status: Home / Self Care | Payer: Federal, State, Local not specified - PPO | Source: Ambulatory Visit | Attending: Internal Medicine | Admitting: Internal Medicine

## 2014-06-02 ENCOUNTER — Ambulatory Visit (INDEPENDENT_AMBULATORY_CARE_PROVIDER_SITE_OTHER): Payer: Federal, State, Local not specified - PPO | Admitting: Internal Medicine

## 2014-06-02 ENCOUNTER — Encounter: Payer: Self-pay | Admitting: Internal Medicine

## 2014-06-02 VITALS — BP 118/84 | HR 76 | Temp 97.5°F | Ht 68.0 in | Wt 200.5 lb

## 2014-06-02 DIAGNOSIS — M25519 Pain in unspecified shoulder: Secondary | ICD-10-CM | POA: Diagnosis not present

## 2014-06-02 DIAGNOSIS — T671XXS Heat syncope, sequela: Secondary | ICD-10-CM

## 2014-06-02 DIAGNOSIS — Z0000 Encounter for general adult medical examination without abnormal findings: Secondary | ICD-10-CM

## 2014-06-02 DIAGNOSIS — M542 Cervicalgia: Secondary | ICD-10-CM

## 2014-06-02 DIAGNOSIS — G4700 Insomnia, unspecified: Secondary | ICD-10-CM | POA: Insufficient documentation

## 2014-06-02 DIAGNOSIS — M8580 Other specified disorders of bone density and structure, unspecified site: Secondary | ICD-10-CM

## 2014-06-02 DIAGNOSIS — R569 Unspecified convulsions: Secondary | ICD-10-CM

## 2014-06-02 DIAGNOSIS — Z1211 Encounter for screening for malignant neoplasm of colon: Secondary | ICD-10-CM | POA: Insufficient documentation

## 2014-06-02 DIAGNOSIS — M5032 Other cervical disc degeneration, mid-cervical region: Secondary | ICD-10-CM | POA: Diagnosis not present

## 2014-06-02 HISTORY — DX: Insomnia, unspecified: G47.00

## 2014-06-02 MED ORDER — ROSUVASTATIN CALCIUM 20 MG PO TABS: 20.0000 mg | ORAL_TABLET | Freq: Every day | ORAL | Status: DC

## 2014-06-02 MED ORDER — DILANTIN 100 MG PO CAPS: ORAL_CAPSULE | ORAL | Status: DC

## 2014-06-02 NOTE — Assessment & Plan Note (Addendum)
Td 08 Had a flu shot  Last colonoscopy 2013, neg ---- next 2023 Prostate cancer screening: DRE normal, check a PSA  Diet and exercise discussed  Other medical problems Chronic neck pain, likely DJD, no radicular symptoms. Get an x-ray, okay Motrin as needed History of syncope, on Dilantin  for more than 30 years, will consult neurology for appropriateness of continued Dilantin High cholesterol, will check labs  Increase LFTs, chronic issue, hepatitis B and C serology -2008. Will recheck LFTs and order further labs on return to the office History of osteopenia, T score -1.3 2006, order a bone density test  Follow-up in 6 months

## 2014-06-02 NOTE — Patient Instructions (Signed)
Get your blood work before you leave   Stop by the first floor and get the XR    Please come back to the office in 6 months  for a routine check up

## 2014-06-02 NOTE — Progress Notes (Signed)
Pre visit review using our clinic review tool, if applicable. No additional management support is needed unless otherwise documented below in the visit note. 

## 2014-06-02 NOTE — Progress Notes (Signed)
Subjective:    Patient ID: Kenneth [name], male    DOB: 02-03-1956, 59 y.o.   MRN: [id number]  DOS:  06/02/2014 Type of visit - description : CPX Interval history: In general feeling well, good compliance with medications without apparent side effects   Review of Systems  Constitutional: Negative for diaphoresis, appetite change and unexpected weight change.  HENT: Negative for dental problem, ear discharge, facial swelling, trouble swallowing and voice change.   Eyes: Negative for photophobia, discharge and redness.  Respiratory:       No cough or sputum production. No wheezing or SOB  Cardiovascular:       No CP No edema or  palpitations   Gastrointestinal: Negative for nausea, vomiting, abdominal pain and diarrhea.       No blood in the stools   Endocrine: Negative for polydipsia, polyphagia and polyuria.  Genitourinary: Negative for urgency, frequency, hematuria and difficulty urinating.       No dysuria  Musculoskeletal: Negative for joint swelling.       Chronic neck pain, bilaterally, sometimes he feels some cracking in the neck when he turns to the sides. Takes  ibuprofen sporadically with help. Pain does not radiate, denies any upper or lower extremity paresthesias, no gait difficulty  Skin: Negative for color change, pallor and rash.  Allergic/Immunologic: Negative for environmental allergies and food allergies.  Neurological: Negative for dizziness and syncope.       No headaches   Hematological: Negative for adenopathy. Does not bruise/bleed easily.  Psychiatric/Behavioral: Negative for suicidal ideas, hallucinations, behavioral problems and confusion.       No unusual or severe anxiety-depression     Past Medical History  Diagnosis Date  . Syncope     in 20s; no etiology, no recurrence. Dilantin Rxed  . Hyperlipidemia   . Allergy   . Hx of colonic polyp 2007  . Insomnia 06/02/2014    Past Surgical History  Procedure Laterality Date  . Colonoscopy with  polypectomy  2007    Dr Carlean Purl, 4 mm rectal polyp  . Nasal fracture surgery    . Tonsillectomy and adenoidectomy    . Colonoscopy  07/13    neg ; due 2023    History   Social History  . Marital Status: Married    Spouse Name: N/A  . Number of Children: 0  . Years of Education: N/A   Occupational History  . Secondary school teacher, works from [employer]   Social History Main Topics  . Smoking status: Never Smoker   . Smokeless tobacco: Never Used  . Alcohol Use: 3.6 - 4.8 oz/week    6-8 Cans of beer per week  . Drug Use: No  . Sexual Activity: Not on file   Other Topics Concern  . Not on file   Social History Narrative   Household pt and wife     Family History  Problem Relation Age of Onset  . Macular degeneration Father   . Macular degeneration Paternal Uncle   . Cancer Mother     throat; smoker  . Colon cancer Neg Hx   . Esophageal cancer Neg Hx   . Stomach cancer Neg Hx   . Rectal cancer Other     maternal cousin  . Stroke Neg Hx   . Heart disease Neg Hx   . Diabetes Neg Hx   . Prostate cancer        Medication List  This list is accurate as of: 06/02/14 11:59 PM.  Always use your most recent med list.               ALPRAZolam 0.5 MG tablet  Commonly known as:  XANAX  Take 1 tablet (0.5 mg total) by mouth at bedtime as needed.     aspirin 81 MG tablet  Take 81 mg by mouth daily.     calcium carbonate 600 MG Tabs tablet  Commonly known as:  OS-CAL  Take 1,200 mg by mouth daily.     cyanocobalamin 500 MCG tablet  Take 1,000 mcg by mouth daily.     DILANTIN 100 MG ER capsule  Generic drug:  phenytoin  TAKE 5 CAPSULES BY MOUTH DAILY     fexofenadine 180 MG tablet  Commonly known as:  ALLEGRA  Take 180 mg by mouth daily.     Fish Oil 300 MG Caps  Take by mouth. (Mega Red)     fluocinonide gel 0.05 %  Commonly known as:  LIDEX  Apply 1 application topically 3 (three) times daily.     ibuprofen 200 MG tablet    Commonly known as:  ADVIL,MOTRIN  Take 200 mg by mouth every 6 (six) hours as needed.     NON FORMULARY  Joint Care-take one daily     rosuvastatin 20 MG tablet  Commonly known as:  CRESTOR  Take 1 tablet (20 mg total) by mouth daily.     vitamin C 1000 MG tablet  Take 1,000 mg by mouth daily.     Vitamin D3 2000 UNITS Tabs  Take 1,000 Units by mouth daily.           Objective:   Physical Exam  Constitutional: He is oriented to person, place, and time. He appears well-developed. No distress.  HENT:  Head: Normocephalic and atraumatic.  Neck: Normal range of motion. Neck supple. No thyromegaly present.  Normal carotid pulses. Cervical spine -- no TTP  Cardiovascular:  RRR, no murmur, rub or gallop  Pulmonary/Chest: Effort normal. No stridor. No respiratory distress.  CTA B  Abdominal: Soft. Bowel sounds are normal. He exhibits no distension and no mass. There is no tenderness. There is no rebound and no guarding.  Genitourinary:  Rectal: No external abnormalities noted. Normal sphincter tone. No rectal masses or tenderness.  Stool: Not found Prostate: Prostate gland firm and smooth, no enlargement, nodularity, tenderness, mass, asymmetry or induration.  Musculoskeletal: Normal range of motion. He exhibits no edema or tenderness.  Lymphadenopathy:    He has no cervical adenopathy.  Neurological: He is alert and oriented to person, place, and time. No cranial nerve deficit. He exhibits normal muscle tone. Coordination normal.  Speech normal, gait unassisted and normal for age, motor strength appropriate for age   Skin: Skin is warm and dry. No pallor.  No jaundice  Psychiatric: He has a normal mood and affect. His behavior is normal. Judgment and thought content normal.  Vitals reviewed. BP 118/84 mmHg  Pulse 76  Temp(Src) 97.5 F (36.4 C) (Oral)  Ht 5' 8"  (1.727 m)  Wt 200 lb 8 oz (90.946 kg)  BMI 30.49 kg/m2  SpO2 95%       Assessment & Plan:   Problem  List Items Addressed This Visit      Cardiovascular and Mediastinum   Syncope   Relevant Medications   rosuvastatin (CRESTOR) tablet   Other Relevant Orders   Dilantin (Phenytoin) level, total (Completed)  Other   Annual physical exam - Primary    Td 08 Had a flu shot  Last colonoscopy 2013, neg ---- next 2023 Prostate cancer screening: DRE normal, check a PSA  Diet and exercise discussed  Other medical problems Chronic neck pain, likely DJD, no radicular symptoms. Get an x-ray, okay Motrin as needed History of syncope, on Dilantin  for more than 30 years, will consult neurology for appropriateness of continued Dilantin High cholesterol, will check labs, continue Lopressor Increase LFTs, chronic issue, hepatitis B and C serology -2008. Will recheck LFTs and order further labs on return to the office History of osteopenia, T score -1.3 2006, order a bone density test  Follow-up in 6 months       Relevant Orders   Comp Met (CMET)   Dilantin (Phenytoin) level, total (Completed)   Lipid panel   PSA   Insomnia   Relevant Orders   Dilantin (Phenytoin) level, total (Completed)    Other Visit Diagnoses    Seizures        Relevant Medications    DILANTIN 100 MG ER capsule    Other Relevant Orders    Dilantin (Phenytoin) level, total (Completed)    Neck pain        Relevant Orders    DG Cervical Spine Complete (Completed)

## 2014-06-03 ENCOUNTER — Other Ambulatory Visit (INDEPENDENT_AMBULATORY_CARE_PROVIDER_SITE_OTHER): Payer: Federal, State, Local not specified - PPO

## 2014-06-03 DIAGNOSIS — Z0000 Encounter for general adult medical examination without abnormal findings: Secondary | ICD-10-CM

## 2014-06-03 DIAGNOSIS — G4700 Insomnia, unspecified: Secondary | ICD-10-CM

## 2014-06-03 DIAGNOSIS — T671XXS Heat syncope, sequela: Secondary | ICD-10-CM

## 2014-06-03 DIAGNOSIS — R569 Unspecified convulsions: Secondary | ICD-10-CM

## 2014-06-03 LAB — PHENYTOIN LEVEL, TOTAL: PHENYTOIN LVL: 12.7 ug/mL (ref 10.0–20.0)

## 2014-06-06 ENCOUNTER — Other Ambulatory Visit: Payer: Self-pay

## 2014-06-06 DIAGNOSIS — M1990 Unspecified osteoarthritis, unspecified site: Secondary | ICD-10-CM

## 2014-06-07 LAB — COMPREHENSIVE METABOLIC PANEL
ALBUMIN: 4.2 g/dL (ref 3.5–5.2)
ALT: 100 U/L — ABNORMAL HIGH (ref 0–53)
AST: 77 U/L — ABNORMAL HIGH (ref 0–37)
Alkaline Phosphatase: 78 U/L (ref 39–117)
BILIRUBIN TOTAL: 0.5 mg/dL (ref 0.2–1.2)
BUN: 15 mg/dL (ref 6–23)
CO2: 28 mEq/L (ref 19–32)
Calcium: 9.4 mg/dL (ref 8.4–10.5)
Chloride: 105 mEq/L (ref 96–112)
Creat: 0.98 mg/dL (ref 0.50–1.35)
Glucose, Bld: 96 mg/dL (ref 70–99)
POTASSIUM: 4.9 meq/L (ref 3.5–5.3)
Sodium: 142 mEq/L (ref 135–145)
Total Protein: 6.5 g/dL (ref 6.0–8.3)

## 2014-06-07 LAB — LIPID PANEL
CHOL/HDL RATIO: 2.5 ratio
Cholesterol: 184 mg/dL (ref 0–200)
HDL: 74 mg/dL (ref >39)
LDL Cholesterol: 87 mg/dL (ref 0–99)
TRIGLYCERIDES: 116 mg/dL (ref <150)
VLDL: 23 mg/dL (ref 0–40)

## 2014-06-07 NOTE — Addendum Note (Signed)
Addended by: Peggyann Shoals on: 06/07/2014 09:16 AM   Modules accepted: Orders

## 2014-06-08 LAB — PSA: PSA: 1.25 ng/mL (ref <=4.00)

## 2014-08-18 ENCOUNTER — Other Ambulatory Visit: Payer: Self-pay | Admitting: Internal Medicine

## 2014-08-18 ENCOUNTER — Other Ambulatory Visit: Payer: Self-pay

## 2014-08-18 NOTE — Telephone Encounter (Signed)
Pt is requesting refill on Alprazolam.  Last OV: 06/02/2014 Last Fill: 05/30/2014 # 30 0RF UDS: None  Please advise.

## 2014-08-18 NOTE — Telephone Encounter (Signed)
Rx printed, awaiting MD signature.  

## 2014-08-18 NOTE — Telephone Encounter (Signed)
Rx faxed to [facility] pharmacy.  

## 2014-08-18 NOTE — Telephone Encounter (Signed)
Okay #30, 1 refill. Also please get a UDS

## 2014-11-30 ENCOUNTER — Other Ambulatory Visit (INDEPENDENT_AMBULATORY_CARE_PROVIDER_SITE_OTHER): Payer: Federal, State, Local not specified - PPO

## 2014-11-30 ENCOUNTER — Ambulatory Visit (INDEPENDENT_AMBULATORY_CARE_PROVIDER_SITE_OTHER): Payer: Federal, State, Local not specified - PPO | Admitting: Internal Medicine

## 2014-11-30 ENCOUNTER — Encounter: Payer: Self-pay | Admitting: Internal Medicine

## 2014-11-30 VITALS — BP 126/88 | HR 74 | Temp 98.2°F | Ht 68.0 in | Wt 195.2 lb

## 2014-11-30 DIAGNOSIS — M159 Polyosteoarthritis, unspecified: Secondary | ICD-10-CM

## 2014-11-30 DIAGNOSIS — R7401 Elevation of levels of liver transaminase levels: Secondary | ICD-10-CM

## 2014-11-30 DIAGNOSIS — R55 Syncope and collapse: Secondary | ICD-10-CM | POA: Diagnosis not present

## 2014-11-30 DIAGNOSIS — E785 Hyperlipidemia, unspecified: Secondary | ICD-10-CM

## 2014-11-30 DIAGNOSIS — R7402 Elevation of levels of lactic acid dehydrogenase (LDH): Secondary | ICD-10-CM

## 2014-11-30 DIAGNOSIS — R740 Nonspecific elevation of levels of transaminase and lactic acid dehydrogenase [LDH]: Secondary | ICD-10-CM

## 2014-11-30 DIAGNOSIS — G4700 Insomnia, unspecified: Secondary | ICD-10-CM

## 2014-11-30 DIAGNOSIS — M150 Primary generalized (osteo)arthritis: Secondary | ICD-10-CM

## 2014-11-30 DIAGNOSIS — M8580 Other specified disorders of bone density and structure, unspecified site: Secondary | ICD-10-CM

## 2014-11-30 DIAGNOSIS — M1990 Unspecified osteoarthritis, unspecified site: Secondary | ICD-10-CM | POA: Insufficient documentation

## 2014-11-30 LAB — IBC PANEL
IRON: 174 ug/dL — AB (ref 42–165)
Saturation Ratios: 57.5 % — ABNORMAL HIGH (ref 20.0–50.0)
TRANSFERRIN: 216 mg/dL (ref 212.0–360.0)

## 2014-11-30 LAB — IRON: Iron: 174 ug/dL — ABNORMAL HIGH (ref 42–165)

## 2014-11-30 LAB — ALT: ALT: 36 U/L (ref 0–53)

## 2014-11-30 LAB — AST: AST: 30 U/L (ref 0–37)

## 2014-11-30 LAB — TRANSFERRIN: Transferrin: 215 mg/dL (ref 212.0–360.0)

## 2014-11-30 LAB — FERRITIN: Ferritin: 250.2 ng/mL (ref 22.0–322.0)

## 2014-11-30 NOTE — Patient Instructions (Signed)
Get your blood work before you leave   We also need a urine sample 

## 2014-11-30 NOTE — Assessment & Plan Note (Signed)
Increase LFTs, will check labs, see orders.

## 2014-11-30 NOTE — Assessment & Plan Note (Addendum)
Excellent  response to Crestor, we decrease crestor dose to 10 mg.

## 2014-11-30 NOTE — Progress Notes (Signed)
Pre visit review using our clinic review tool, if applicable. No additional management support is needed unless otherwise documented below in the visit note. 

## 2014-11-30 NOTE — Assessment & Plan Note (Addendum)
Since the last visit, he declined to see neurology. States that he saw them before (Dr Erling Cruz)  and they offered the option to continue with medication or try to wean off. His decision is to continue on meds.

## 2014-11-30 NOTE — Assessment & Plan Note (Signed)
At the last visit, patient complained of neck pain, x-ray show severe arthritis, saw Dr. Tamera Punt orthopedics. Was recommended naproxen which he took temporarily, pain better. Was not recommended further eval

## 2014-11-30 NOTE — Assessment & Plan Note (Signed)
On Xanax, check a UDS 

## 2014-11-30 NOTE — Assessment & Plan Note (Signed)
Did not pursue a bone density test

## 2014-11-30 NOTE — Progress Notes (Signed)
Subjective:    Patient ID: Gaynell Face., male    DOB: [birth year], 59 y.o.   MRN: [id number]  DOS:  11/30/2014 Type of visit - description : Follow-up from previous visit Interval history: on Dilantin, declined to see neurology Neck pain: Saw orthopedic surgery, prescribed naproxen Increase LFTs: Ongoing problem, needs blood work. Did not pursue a bone density test High cholesterol: Great results, Crestor dose decreased   Review of Systems Recovering from a URI, was exposed to pneumonia, he is "90% better". No fever chills No nausea, vomiting, diarrhea  Past Medical History  Diagnosis Date  . Syncope     in 20s; no etiology, no recurrence. Dilantin Rxed  . Hyperlipidemia   . Allergy   . Hx of colonic polyp 2007  . Insomnia 06/02/2014    Past Surgical History  Procedure Laterality Date  . Colonoscopy with polypectomy  2007    Dr Carlean Purl, 4 mm rectal polyp  . Nasal fracture surgery    . Tonsillectomy and adenoidectomy    . Colonoscopy  07/13    neg ; due 2023    Social History   Social History  . Marital Status: Married    Spouse Name: N/A  . Number of Children: 0  . Years of Education: N/A   Occupational History  . Secondary school teacher, works from [employer]   Social History Main Topics  . Smoking status: Never Smoker   . Smokeless tobacco: Never Used  . Alcohol Use: 3.6 - 4.8 oz/week    6-8 Cans of beer per week  . Drug Use: No  . Sexual Activity: Not on file   Other Topics Concern  . Not on file   Social History Narrative   Household pt and wife        Medication List       This list is accurate as of: 11/30/14  6:33 PM.  Always use your most recent med list.               ALPRAZolam 0.5 MG tablet  Commonly known as:  XANAX  Take 1 tablet (0.5 mg total) by mouth at bedtime as needed for anxiety.     aspirin 81 MG tablet  Take 81 mg by mouth daily.     calcium carbonate 600 MG Tabs tablet  Commonly known as:   OS-CAL  Take 1,200 mg by mouth daily.     cyanocobalamin 500 MCG tablet  Take 1,000 mcg by mouth daily.     DILANTIN 100 MG ER capsule  Generic drug:  phenytoin  TAKE 5 CAPSULES BY MOUTH DAILY     fexofenadine 180 MG tablet  Commonly known as:  ALLEGRA  Take 180 mg by mouth daily.     Fish Oil 300 MG Caps  Take by mouth. (Mega Red)     fluocinonide gel 0.05 %  Commonly known as:  LIDEX  Apply 1 application topically 3 (three) times daily.     ibuprofen 200 MG tablet  Commonly known as:  ADVIL,MOTRIN  Take 200 mg by mouth every 6 (six) hours as needed.     NON FORMULARY  Joint Care-take one daily     rosuvastatin 20 MG tablet  Commonly known as:  CRESTOR  Take 1 tablet (20 mg total) by mouth daily.     vitamin C 1000 MG tablet  Take 1,000 mg by mouth daily.     Vitamin D3 2000 UNITS Tabs  Take 1,000 Units by mouth daily.           Objective:   Physical Exam BP 126/88 mmHg  Pulse 74  Temp(Src) 98.2 F (36.8 C) (Oral)  Ht 5\' 8"  (1.727 m)  Wt 195 lb 4 oz (88.565 kg)  BMI 29.69 kg/m2  SpO2 99% General:   Well developed, well nourished . NAD.  HEENT:  Normocephalic . Face symmetric, atraumatic. TMs normal, nose is slightly congested, throat symmetric. Lungs:  CTA B Normal respiratory effort, no intercostal retractions, no accessory muscle use. Heart: RRR,  no murmur.  No pretibial edema bilaterally  Skin: Not pale. Not jaundice Neurologic:  alert & oriented X3.  Speech normal, gait appropriate for age and unassisted Psych--  Cognition and judgment appear intact.  Cooperative with normal attention span and concentration.  Behavior appropriate. No anxious or depressed appearing.      Assessment & Plan:

## 2014-12-02 LAB — CERULOPLASMIN: Ceruloplasmin: 32 mg/dL (ref 18–36)

## 2014-12-06 NOTE — Addendum Note (Signed)
Addended by: Modena Morrow D on: 12/06/2014 03:57 PM   Modules accepted: Orders

## 2015-03-30 ENCOUNTER — Telehealth: Payer: Self-pay | Admitting: Internal Medicine

## 2015-05-17 NOTE — Telephone Encounter (Signed)
Flu shot is already documented in chart.

## 2015-05-17 NOTE — Telephone Encounter (Signed)
I did get a flu shot late September or early October @ [facility].

## 2015-05-19 ENCOUNTER — Telehealth: Payer: Self-pay | Admitting: Internal Medicine

## 2015-05-19 MED ORDER — ALPRAZOLAM 0.5 MG PO TABS: 0.5000 mg | ORAL_TABLET | Freq: Every evening | ORAL | Status: DC | PRN

## 2015-05-19 NOTE — Telephone Encounter (Signed)
Caller name: Naftuly   Relationship to patient: Self   Can be reached: [phone]  Pharmacy: [id number] - [place], [address] AT [place]  Reason for call: pt is requesting a refill on his ALPRAZolam Rx.

## 2015-05-19 NOTE — Telephone Encounter (Signed)
Pt is requesting refill on Alprazolam.  Last OV: 11/30/2014 Last Fill: 08/18/2014 #30 and 1RF UDS: 12/14/2012 Low risk  Please advise.

## 2015-05-19 NOTE — Telephone Encounter (Signed)
Rx faxed to [facility] pharmacy.  

## 2015-05-19 NOTE — Telephone Encounter (Signed)
Okay 30 and no refills, he is due for a visit next month

## 2015-05-19 NOTE — Telephone Encounter (Signed)
Rx printed, awaiting MD signature.  

## 2015-06-23 ENCOUNTER — Telehealth: Payer: Self-pay | Admitting: Internal Medicine

## 2015-06-23 MED ORDER — OSELTAMIVIR PHOSPHATE 75 MG PO CAPS: 75.0000 mg | ORAL_CAPSULE | Freq: Every day | ORAL | Status: DC

## 2015-06-23 NOTE — Telephone Encounter (Signed)
Relation to [id number] Call back [place]: [place] [id number] - [place], [place] AT [facility] [address] [phone] (Phone) [phone] (Fax)         Reason for call:  Patient mother in law is being discharged from the hospital today due to her being dx with the flu. Hospital advised Mr. Mooneyhan to contact PCP and request a Rx for tamiflu for preventative care. Mother in law will be staying with patient until she feels better. Please advise

## 2015-06-23 NOTE — Telephone Encounter (Signed)
Spoke w/ Pt, informed him that Rx has been sent to [facility], instructed how to take med and informed him he will need to be seen if he starts developing symptoms of the flu. Pt verbalized understanding.

## 2015-06-23 NOTE — Telephone Encounter (Signed)
Please advise 

## 2015-06-23 NOTE — Telephone Encounter (Signed)
Agree, call in  Tamiflu 75 mg one by mouth daily for 10 days #10 no refills

## 2015-07-17 ENCOUNTER — Telehealth: Payer: Self-pay | Admitting: *Deleted

## 2015-07-17 NOTE — Telephone Encounter (Signed)
Pt unavailable at time of Pre-Visit Call as he is at a restaurant.

## 2015-07-18 ENCOUNTER — Encounter: Payer: Self-pay | Admitting: Internal Medicine

## 2015-07-18 ENCOUNTER — Ambulatory Visit (INDEPENDENT_AMBULATORY_CARE_PROVIDER_SITE_OTHER): Payer: Federal, State, Local not specified - PPO | Admitting: Internal Medicine

## 2015-07-18 VITALS — BP 118/60 | HR 54 | Temp 98.0°F | Ht 68.0 in | Wt 192.1 lb

## 2015-07-18 DIAGNOSIS — Z114 Encounter for screening for human immunodeficiency virus [HIV]: Secondary | ICD-10-CM

## 2015-07-18 DIAGNOSIS — R569 Unspecified convulsions: Secondary | ICD-10-CM

## 2015-07-18 DIAGNOSIS — Z0000 Encounter for general adult medical examination without abnormal findings: Secondary | ICD-10-CM | POA: Diagnosis not present

## 2015-07-18 DIAGNOSIS — Z09 Encounter for follow-up examination after completed treatment for conditions other than malignant neoplasm: Secondary | ICD-10-CM

## 2015-07-18 MED ORDER — DILANTIN 100 MG PO CAPS: ORAL_CAPSULE | ORAL | Status: DC

## 2015-07-18 NOTE — Patient Instructions (Signed)
GO TO THE FRONT DESK  Schedule labs to be done  Within a week, fasting Schedule your next appointment for a  physical When?   1 year Fasting?  Yes  If is more convenient we can  see you in the afternoon and check labs the next day

## 2015-07-18 NOTE — Progress Notes (Signed)
Subjective:    Patient ID: Kenneth [name]., male    DOB: [birth year], 60 y.o.   MRN: [id number]  DOS:  07/18/2015 Type of visit - description : CPX Interval history: No major concerns, reports a healthy diet, he remains active.   Review of Systems Constitutional: No fever. No chills. No unexplained wt changes. No unusual sweats  HEENT: No dental problems, no ear discharge, no facial swelling, no voice changes. No eye discharge, no eye  redness , no  intolerance to light   Respiratory: No wheezing , no  difficulty breathing. No cough , no mucus production  Cardiovascular: No CP, no leg swelling , no  Palpitations  GI: no nausea, no vomiting, no diarrhea , no  abdominal pain.  No blood in the stools. No dysphagia, no odynophagia    Endocrine: No polyphagia, no polyuria , no polydipsia  GU: No dysuria, gross hematuria, difficulty urinating. No urinary urgency, no frequency.  Musculoskeletal: No joint swellings or unusual aches or pains  Skin: No change in the color of the skin, palor , no  Rash  Allergic, immunologic: No environmental allergies , no  food allergies  Neurological: No dizziness no  syncope. No headaches. No diplopia, no slurred, no slurred speech, no motor deficits, no facial  Numbness  Hematological: No enlarged lymph nodes, no easy bruising , no unusual bleedings  Psychiatry: No suicidal ideas, no hallucinations, no beavior problems, no confusion.  No unusual/severe anxiety, no depression   Past Medical History  Diagnosis Date  . Syncope     in 20s; no etiology, no recurrence. Dilantin Rxed  . Hyperlipidemia   . Allergy   . Hx of colonic polyp 2007  . Insomnia 06/02/2014    Past Surgical History  Procedure Laterality Date  . Colonoscopy with polypectomy  2007    Dr Carlean Purl, 4 mm rectal polyp  . Nasal fracture surgery    . Tonsillectomy and adenoidectomy    . Colonoscopy  07/13    neg ; due 2023  . Dental surgery      bone grafts, to get  implants    Social History   Social History  . Marital Status: Married    Spouse Name: N/A  . Number of Children: 0  . Years of Education: N/A   Occupational History  . Secondary school teacher, works from [employer]   Social History Main Topics  . Smoking status: Never Smoker   . Smokeless tobacco: Never Used  . Alcohol Use: 3.6 - 4.8 oz/week    6-8 Cans of beer per week  . Drug Use: No  . Sexual Activity: Not on file   Other Topics Concern  . Not on file   Social History Narrative   Household pt and wife     Family History  Problem Relation Age of Onset  . Macular degeneration Father   . Macular degeneration Paternal Uncle   . Cancer Mother     throat; smoker  . Colon cancer Neg Hx   . Esophageal cancer Neg Hx   . Stomach cancer Neg Hx   . Rectal cancer Other     maternal cousin  . Stroke Neg Hx   . Heart disease Neg Hx   . Diabetes Neg Hx   . Prostate cancer Neg Hx        Medication List       This list is accurate as of: 07/18/15 11:59 PM.  Always use  your most recent med list.               ALPRAZolam 0.5 MG tablet  Commonly known as:  XANAX  Take 1 tablet (0.5 mg total) by mouth at bedtime as needed for anxiety.     aspirin 81 MG tablet  Take 81 mg by mouth daily.     calcium carbonate 600 MG Tabs tablet  Commonly known as:  OS-CAL  Take 1,200 mg by mouth daily.     cyanocobalamin 500 MCG tablet  Take 1,000 mcg by mouth daily.     DILANTIN 100 MG ER capsule  Generic drug:  phenytoin  TAKE 5 CAPSULES BY MOUTH DAILY     fexofenadine 180 MG tablet  Commonly known as:  ALLEGRA  Take 180 mg by mouth daily.     FISH OIL 300 MG Caps  Take by mouth. (Mega Red)     fluocinonide gel 0.05 %  Commonly known as:  LIDEX  Apply 1 application topically 3 (three) times daily as needed.     naproxen 500 MG tablet  Commonly known as:  NAPROSYN  Take 500 mg by mouth as needed.     NON FORMULARY  Joint Care-take one daily      rosuvastatin 20 MG tablet  Commonly known as:  CRESTOR  Take 10 mg by mouth daily.     vitamin C 1000 MG tablet  Take 1,000 mg by mouth daily.     Vitamin D3 2000 units Tabs  Take 1,000 Units by mouth daily.           Objective:   Physical Exam BP 118/60 mmHg  Pulse 54  Temp(Src) 98 F (36.7 C) (Oral)  Ht 5\' 8"  (1.727 m)  Wt 192 lb 2 oz (87.147 kg)  BMI 29.22 kg/m2  SpO2 96%  General:   Well developed, well nourished . NAD.  Neck: No  thyromegaly , normal carotid pulse HEENT:  Normocephalic . [name] symmetric, atraumatic Lungs:  CTA B Normal respiratory effort, no intercostal retractions, no accessory muscle use. Heart: RRR,  no murmur.  No pretibial edema bilaterally  Abdomen:  Not distended, soft, non-tender. No rebound or rigidity.   Skin: Exposed areas without rash. Not pale. Not jaundice Neurologic:  alert & oriented X3.  Speech normal, gait appropriate for age and unassisted Strength symmetric and appropriate for age.  Psych: Cognition and judgment appear intact.  Cooperative with normal attention span and concentration.  Behavior appropriate. No anxious or depressed appearing.    Assessment & Plan:   Assessment Hyperlipidemia Insomnia-- xanax prn, rx per pcp Allergies Syncope, in the 1980s, on Dilantin since then, used to see Dr. love, reluctant to stop Osteopenia -- on ca and vit d Elevated LFTs: on-off x years ,  Hepatitis B C -2008, iron, ferritin, alpha-1 antitrypsin normal 2016. Incomplete RBBB-previously a stress test was negative  Plan: Hyperlipidemia: Currently on Crestor 10 mg, check labs Insomnia, well-controlled with Xanax, check a UDS History of syncope: Check Dilantin level Osteopenia: Declined DEXA, reports he had several before and were normal. Agreed to check that next year. Increase LFTs: Workup negative, recheck LFTs RTC one year

## 2015-07-18 NOTE — Progress Notes (Signed)
Pre visit review using our clinic review tool, if applicable. No additional management support is needed unless otherwise documented below in the visit note. 

## 2015-07-18 NOTE — Assessment & Plan Note (Addendum)
Td 08 Had a flu shot  Last colonoscopy 2013, neg ---- next 2023 Prostate cancer screening: DRE   PSA wnl 2016  Diet and exercise discussed

## 2015-07-19 ENCOUNTER — Other Ambulatory Visit: Payer: Self-pay | Admitting: Internal Medicine

## 2015-07-19 ENCOUNTER — Other Ambulatory Visit (INDEPENDENT_AMBULATORY_CARE_PROVIDER_SITE_OTHER): Payer: Federal, State, Local not specified - PPO

## 2015-07-19 DIAGNOSIS — Z0000 Encounter for general adult medical examination without abnormal findings: Secondary | ICD-10-CM | POA: Diagnosis not present

## 2015-07-19 DIAGNOSIS — Z09 Encounter for follow-up examination after completed treatment for conditions other than malignant neoplasm: Secondary | ICD-10-CM | POA: Insufficient documentation

## 2015-07-19 DIAGNOSIS — Z114 Encounter for screening for human immunodeficiency virus [HIV]: Secondary | ICD-10-CM

## 2015-07-19 DIAGNOSIS — R569 Unspecified convulsions: Secondary | ICD-10-CM

## 2015-07-19 LAB — COMPREHENSIVE METABOLIC PANEL
ALT: 50 U/L (ref 0–53)
AST: 37 U/L (ref 0–37)
Albumin: 4.2 g/dL (ref 3.5–5.2)
Alkaline Phosphatase: 70 U/L (ref 39–117)
BUN: 15 mg/dL (ref 6–23)
CALCIUM: 9.2 mg/dL (ref 8.4–10.5)
CHLORIDE: 107 meq/L (ref 96–112)
CO2: 27 meq/L (ref 19–32)
CREATININE: 0.79 mg/dL (ref 0.40–1.50)
GFR: 105.71 mL/min (ref >60.00)
GLUCOSE: 98 mg/dL (ref 70–99)
Potassium: 4.1 mEq/L (ref 3.5–5.1)
SODIUM: 140 meq/L (ref 135–145)
Total Bilirubin: 0.4 mg/dL (ref 0.2–1.2)
Total Protein: 6.5 g/dL (ref 6.0–8.3)

## 2015-07-19 LAB — CBC WITH DIFFERENTIAL/PLATELET
BASOS ABS: 0 10*3/uL (ref 0.0–0.1)
Basophils Relative: 0.5 % (ref 0.0–3.0)
EOS ABS: 0.1 10*3/uL (ref 0.0–0.7)
Eosinophils Relative: 3.1 % (ref 0.0–5.0)
HCT: 46.2 % (ref 39.0–52.0)
Hemoglobin: 15.8 g/dL (ref 13.0–17.0)
LYMPHS ABS: 1.3 10*3/uL (ref 0.7–4.0)
LYMPHS PCT: 25.9 % (ref 12.0–46.0)
MCHC: 34.1 g/dL (ref 30.0–36.0)
MCV: 97.2 fl (ref 78.0–100.0)
Monocytes Absolute: 0.5 10*3/uL (ref 0.1–1.0)
Monocytes Relative: 9.7 % (ref 3.0–12.0)
NEUTROS ABS: 3 10*3/uL (ref 1.4–7.7)
NEUTROS PCT: 60.8 % (ref 43.0–77.0)
PLATELETS: 237 10*3/uL (ref 150.0–400.0)
RBC: 4.76 Mil/uL (ref 4.22–5.81)
RDW: 13 % (ref 11.5–15.5)
WBC: 4.9 10*3/uL (ref 4.0–10.5)

## 2015-07-19 LAB — LIPID PANEL
CHOL/HDL RATIO: 3
Cholesterol: 191 mg/dL (ref 0–200)
HDL: 67.3 mg/dL (ref >39.00)
LDL CALC: 104 mg/dL — AB (ref 0–99)
NONHDL: 123.76
TRIGLYCERIDES: 101 mg/dL (ref 0.0–149.0)
VLDL: 20.2 mg/dL (ref 0.0–40.0)

## 2015-07-19 NOTE — Assessment & Plan Note (Signed)
Hyperlipidemia: Currently on Crestor 10 mg, check labs Insomnia, well-controlled with Xanax, check a UDS History of syncope: Check Dilantin level Osteopenia: Declined DEXA, reports he had several before and were normal. Agreed to check that next year. Increase LFTs: Workup negative, recheck LFTs RTC one year

## 2015-07-20 LAB — HIV ANTIBODY (ROUTINE TESTING W REFLEX): HIV 1&2 Ab, 4th Generation: NONREACTIVE

## 2015-07-20 LAB — PHENYTOIN LEVEL, TOTAL: Phenytoin Lvl: 13 ug/mL (ref 10.0–20.0)

## 2015-07-28 ENCOUNTER — Telehealth: Payer: Self-pay | Admitting: Internal Medicine

## 2015-07-28 NOTE — Telephone Encounter (Signed)
Pt is requesting refill on Alprazolam.  Last OV: 07/18/2015 Last Fill: 05/19/2015 #30 and 0RF UDS: 07/19/2015 Pending  Please advise.

## 2015-07-28 NOTE — Telephone Encounter (Signed)
Okay #30 and one refill 

## 2015-07-28 NOTE — Telephone Encounter (Signed)
Relation to [id number] Call back [place]: [place] [address] - [place], [place] AT [facility] [address] [phone] (Phone) [phone] (Fax)         Reason for call:  Patient requesting a refill ALPRAZolam (XANAX) 0.5 MG tablet

## 2015-07-31 ENCOUNTER — Telehealth: Payer: Self-pay

## 2015-07-31 MED ORDER — ALPRAZOLAM 0.5 MG PO TABS: 0.5000 mg | ORAL_TABLET | Freq: Every evening | ORAL | Status: DC | PRN

## 2015-07-31 NOTE — Telephone Encounter (Signed)
UDS: 07/19/2015  Negative for Alprazolam:PRN   Low risk per Dr. Larose Kells 07/31/2015

## 2015-07-31 NOTE — Telephone Encounter (Signed)
Rx faxed to [facility] pharmacy.  

## 2015-07-31 NOTE — Telephone Encounter (Signed)
Rx printed, awaiting MD signature.  

## 2015-09-13 ENCOUNTER — Encounter: Payer: Self-pay | Admitting: Medical

## 2015-09-13 ENCOUNTER — Ambulatory Visit (INDEPENDENT_AMBULATORY_CARE_PROVIDER_SITE_OTHER): Payer: Federal, State, Local not specified - PPO | Admitting: Medical

## 2015-09-13 VITALS — BP 120/64 | HR 52 | Temp 98.1°F | Ht 68.0 in | Wt 188.8 lb

## 2015-09-13 DIAGNOSIS — R1032 Left lower quadrant pain: Secondary | ICD-10-CM

## 2015-09-13 DIAGNOSIS — R102 Pelvic and perineal pain: Secondary | ICD-10-CM

## 2015-09-13 DIAGNOSIS — N509 Disorder of male genital organs, unspecified: Secondary | ICD-10-CM

## 2015-09-13 LAB — POC URINALSYSI DIPSTICK (AUTOMATED)
Bilirubin, UA: NEGATIVE
Glucose, UA: NEGATIVE
Ketones, UA: NEGATIVE
Leukocytes, UA: NEGATIVE
NITRITE UA: NEGATIVE
PH UA: 6
PROTEIN UA: NEGATIVE
RBC UA: NEGATIVE
Spec Grav, UA: 1.015
UROBILINOGEN UA: 0.2

## 2015-09-13 LAB — PSA: PSA: 1 ng/mL (ref 0.10–4.00)

## 2015-09-13 MED ORDER — ROSUVASTATIN CALCIUM 10 MG PO TABS: 10.0000 mg | ORAL_TABLET | Freq: Every day | ORAL | Status: DC

## 2015-09-13 NOTE — Patient Instructions (Addendum)
For your regional pain possible inguinal/groin area and perineum pain will get a urine and psa.  Hard to make a diagnosis presently based on history and exam but do want to rule out prostate as cause.   If blood work and urine doe not reveal cause  then may have to rely on imaging studies.  Follow up in 7 days or as needed

## 2015-09-13 NOTE — Progress Notes (Signed)
Subjective:    Patient ID: Kenneth [name]., male    DOB: February 02, 1956, 61 y.o.   MRN: [id number]  HPI  Pt in for some groin pain off and on for a month or more.   Pt states does not notice it when he is active. He descirbies some faint general pain off an on for 2-3 months. He can't localize pain. He thinks maybe  feel heaviness in perineum region. No inguinal bulge.  No urinary symptoms. No problems urinating. No fever, no chills or sweats.   Pt states he wore tighter underwear and he thought his faint discomfort improved.  Review of Systems  Constitutional: Negative for fever, chills and fatigue.  Respiratory: Negative for cough, chest tightness, shortness of breath and wheezing.   Cardiovascular: Negative for chest pain and palpitations.  Gastrointestinal: Negative for nausea, vomiting, abdominal pain, diarrhea, blood in stool, abdominal distention and rectal pain.  Genitourinary: Negative for dysuria, urgency, frequency, flank pain, discharge, scrotal swelling and penile pain.  Neurological: Negative for dizziness and headaches.  Hematological: Negative for adenopathy. Does not bruise/bleed easily.  Psychiatric/Behavioral: Negative for behavioral problems and confusion.     Past Medical History  Diagnosis Date  . Syncope     in 20s; no etiology, no recurrence. Dilantin Rxed  . Hyperlipidemia   . Allergy   . Hx of colonic polyp 2007  . Insomnia 06/02/2014     Social History   Social History  . Marital Status: Married    Spouse Name: N/A  . Number of Children: 0  . Years of Education: N/A   Occupational History  . Secondary school teacher, works from [employer]   Social History Main Topics  . Smoking status: Never Smoker   . Smokeless tobacco: Never Used  . Alcohol Use: 3.6 - 4.8 oz/week    6-8 Cans of beer per week  . Drug Use: No  . Sexual Activity: Not on file   Other Topics Concern  . Not on file   Social History Narrative   Household pt  and wife    Past Surgical History  Procedure Laterality Date  . Colonoscopy with polypectomy  2007    Dr Carlean Purl, 4 mm rectal polyp  . Nasal fracture surgery    . Tonsillectomy and adenoidectomy    . Colonoscopy  07/13    neg ; due 2023  . Dental surgery      bone grafts, to get implants    Family History  Problem Relation Age of Onset  . Macular degeneration Father   . Macular degeneration Paternal Uncle   . Cancer Mother     throat; smoker  . Colon cancer Neg Hx   . Esophageal cancer Neg Hx   . Stomach cancer Neg Hx   . Rectal cancer Other     maternal cousin  . Stroke Neg Hx   . Heart disease Neg Hx   . Diabetes Neg Hx   . Prostate cancer Neg Hx     No Known Allergies  Current Outpatient Prescriptions on File Prior to Visit  Medication Sig Dispense Refill  . ALPRAZolam (XANAX) 0.5 MG tablet Take 1 tablet (0.5 mg total) by mouth at bedtime as needed for anxiety. 30 tablet 1  . Ascorbic Acid (VITAMIN C) 1000 MG tablet Take 1,000 mg by mouth daily.      Marland Kitchen aspirin 81 MG tablet Take 81 mg by mouth daily.    . calcium carbonate (  OS-CAL) 600 MG TABS Take 1,200 mg by mouth daily.     . Cholecalciferol (VITAMIN D3) 2000 UNITS TABS Take 1,000 Units by mouth daily.     . cyanocobalamin 500 MCG tablet Take 1,000 mcg by mouth daily.     Marland Kitchen DILANTIN 100 MG ER capsule TAKE 5 CAPSULES BY MOUTH DAILY 450 capsule 3  . fexofenadine (ALLEGRA) 180 MG tablet Take 180 mg by mouth daily.    . fluocinonide gel (LIDEX) [id number] % Apply 1 application topically 3 (three) times daily as needed.     . naproxen (NAPROSYN) 500 MG tablet Take 500 mg by mouth as needed.    . NON FORMULARY Joint Care-take one daily    . Omega-3 Fatty Acids (FISH OIL) 300 MG CAPS Take by mouth. (Mega Red)     . rosuvastatin (CRESTOR) 20 MG tablet Take 10 mg by mouth daily.     No current facility-administered medications on file prior to visit.    BP 120/64 mmHg  Pulse 52  Temp(Src) 98.1 F (36.7 C) (Oral)  Ht 5'  8" (1.727 m)  Wt 188 lb 12.8 oz (85.639 kg)  BMI 28.71 kg/m2  SpO2 94%       Objective:   Physical Exam  General Mental Status- Alert. General Appearance- Not in acute distress.    Chest and Lung Exam Auscultation: Breath Sounds:-Normal.  Cardiovascular Auscultation:Rythm- Regular. Murmurs & Other Heart Sounds:Auscultation of the heart reveals- No Murmurs.  Abdomen Inspection:-Inspeection Normal. Palpation/Percussion:Note:No mass. Palpation and Percussion of the abdomen reveal- Non Tender, Non Distended + BS, no rebound or guarding.  Male Genitourinary Urethra:- No discharge. Penis- Circumcised. Scrotum- No masses. Testes- Bilateral-Normal. No obvious hernia on either side(pt speculates maybe occasional lt groin area pain) no obvious abnormality in perinuem region.  Rectal Anorectal Exam: Performed- Normal sphincter tone. No masses noted. Prostate smooth normal size. Stool HEME Negative.          Assessment & Plan:  For your regional pain possible inguinal/groin area and perineum pain will get a urine and psa.  Hard to make a diagnosis presently based on history and exam but do want to rule out prostate as cause.   If blood work and urine does not reveal cause  then may have to rely on imaging studies.  Follow up in 7 days or as needed   , Percell Miller, [employer]

## 2015-09-13 NOTE — Addendum Note (Signed)
Addended by: Tasia Catchings on: 09/13/2015 10:08 AM   Modules accepted: Orders, Medications

## 2015-09-13 NOTE — Progress Notes (Signed)
Pre visit review using our clinic review tool, if applicable. No additional management support is needed unless otherwise documented below in the visit note. 

## 2015-10-12 DIAGNOSIS — H40013 Open angle with borderline findings, low risk, bilateral: Secondary | ICD-10-CM | POA: Diagnosis not present

## 2015-11-08 ENCOUNTER — Encounter: Payer: Self-pay | Admitting: Internal Medicine

## 2015-11-14 ENCOUNTER — Telehealth: Payer: Self-pay

## 2015-11-14 NOTE — Telephone Encounter (Signed)
FYI  Patient came into the office to schedule an appointment, talk about a bill, and  The fact that he waited on the phone for 10 minutes and 3 different times and call was disconnected. Patient was very mad. He done two test calls and schedulers answered after 3-4 minutes. Patient could see Schedulers on the phone but was upset that the wait was that long.I apologized for his inconvenience and told him we are working to improve our phone system He also complained that [facility] would not give him the next available date for an appointment with Dr. Larose Kells. He said that he does not like having to come in to talk about issues explained to patient that he could leave a message on the voicemail if he did not have time to wait on the line for the next rep. He wanted me to show him the option on the phone so patient called the office and I prompted him to his option 1. He was routine to an open scheduler and received an answer. Patient left very frustrated at our phone process.

## 2015-11-16 ENCOUNTER — Encounter: Payer: Self-pay | Admitting: Medical

## 2015-11-16 NOTE — Telephone Encounter (Signed)
Pt advised to come in to see Dr. Larose Kells for his history of testicle pain. Patient never followed up after I saw him. He sent me my chart message and I advised follow up. I think he is scheduled to see Dr Larose Kells early august. 2017. Wanted to make you aware.

## 2015-11-17 NOTE — Telephone Encounter (Signed)
Forwarding to PCP for FYI:  

## 2015-11-23 ENCOUNTER — Encounter: Payer: Self-pay | Admitting: Internal Medicine

## 2015-11-23 ENCOUNTER — Ambulatory Visit (INDEPENDENT_AMBULATORY_CARE_PROVIDER_SITE_OTHER): Payer: Federal, State, Local not specified - PPO | Admitting: Internal Medicine

## 2015-11-23 VITALS — BP 124/78 | HR 56 | Temp 98.2°F | Resp 12 | Ht 68.0 in | Wt 186.1 lb

## 2015-11-23 DIAGNOSIS — N509 Disorder of male genital organs, unspecified: Secondary | ICD-10-CM

## 2015-11-23 DIAGNOSIS — R102 Pelvic and perineal pain: Secondary | ICD-10-CM

## 2015-11-23 NOTE — Patient Instructions (Signed)
Will schedule a ultrasound  If you are no better in 4-6 weeks please call

## 2015-11-23 NOTE — Assessment & Plan Note (Signed)
Perineal pain: Recent DRE negative, UA and PSA normal, ROS essentially negative, does not seem to have a serious condition on clinical grounds. Plan: Get a scrotal ultrasound, observe x  4-6 weeks, if he is not improving will proceed with further eval. CT pelvis?Marland Kitchen

## 2015-11-23 NOTE — Progress Notes (Signed)
Pre visit review using our clinic review tool, if applicable. No additional management support is needed unless otherwise documented below in the visit note. 

## 2015-11-23 NOTE — Progress Notes (Signed)
Subjective:    Patient ID: Kenneth [name]., male    DOB: January 18, 1956, 60 y.o.   MRN: [id number]  DOS:  11/23/2015  Type of visit - description : check up Interval history: Symptoms started approximately 3-4 months ago, reports on and off ill-defined discomfort at the perineum/genital area. Cannot pinpoint any specific place, is not testicular pain per se. Is worse at rest when he's sitting, decrease with activity. Seen  recently by another provider, digital rectal exam was unremarkable, UA negative, PSA normal. Is the patient's observation that when he took amoxicillin for a dental work symptoms decreased temporarily.   Review of Systems  No fever chills No abdominal or flank pain. No injury or fall Denies any penile discharge, genital rash, swelling of the scrotum or groin. No dysuria, gross hematuria difficulty urinating BMs normal  Past Medical History:  Diagnosis Date  . Allergy   . Hx of colonic polyp 2007  . Hyperlipidemia   . Insomnia 06/02/2014  . Syncope    in 20s; no etiology, no recurrence. Dilantin Rxed    Past Surgical History:  Procedure Laterality Date  . colonoscopy with polypectomy  2007   Dr Carlean Purl, 4 mm rectal polyp  . DENTAL SURGERY     bone grafts, to get implants  . NASAL FRACTURE SURGERY    . TONSILLECTOMY AND ADENOIDECTOMY      Social History   Social History  . Marital status: Married    Spouse name: N/A  . Number of children: 0  . Years of education: N/A   Occupational History  . Secondary school teacher, works from [employer]   Social History Main Topics  . Smoking status: Never Smoker  . Smokeless tobacco: Never Used  . Alcohol use 3.6 - 4.8 oz/week    6 - 8 Cans of beer per week  . Drug use: No  . Sexual activity: Not on file   Other Topics Concern  . Not on file   Social History Narrative   Household pt and wife        Medication List       Accurate as of 11/23/15  4:47 PM. Always use your most recent  med list.          ALPRAZolam 0.5 MG tablet Commonly known as:  XANAX Take 1 tablet (0.5 mg total) by mouth at bedtime as needed for anxiety.   aspirin 81 MG tablet Take 81 mg by mouth daily.   calcium carbonate 600 MG Tabs tablet Commonly known as:  OS-CAL Take 1,200 mg by mouth daily.   cyanocobalamin 500 MCG tablet Take 1,000 mcg by mouth daily.   DILANTIN 100 MG ER capsule Generic drug:  phenytoin TAKE 5 CAPSULES BY MOUTH DAILY   fexofenadine 180 MG tablet Commonly known as:  ALLEGRA Take 180 mg by mouth daily.   FISH OIL 300 MG Caps Take by mouth. (Mega Red)   fluocinonide gel 0.05 % Commonly known as:  LIDEX Apply 1 application topically 3 (three) times daily as needed.   naproxen 500 MG tablet Commonly known as:  NAPROSYN Take 500 mg by mouth as needed.   NON FORMULARY Joint Care-take one daily   rosuvastatin 10 MG tablet Commonly known as:  CRESTOR Take 1 tablet (10 mg total) by mouth daily.   vitamin C 1000 MG tablet Take 1,000 mg by mouth daily.   Vitamin D3 2000 units Tabs Take 1,000 Units by mouth daily.  Objective:   Physical Exam BP 124/78 (BP Location: Left Arm, Patient Position: Sitting, Cuff Size: Normal)   Pulse (!) 56   Temp 98.2 F (36.8 C) (Oral)   Resp 12   Ht 5\' 8"  (1.727 m)   Wt 186 lb 2 oz (84.4 kg)   SpO2 97%   BMI 28.30 kg/m  General:   Well developed, well nourished . NAD.  HEENT:  Normocephalic . [name] symmetric, atraumatic Lungs:  CTA B Normal respiratory effort, no intercostal retractions, no accessory muscle use. Heart: RRR,  no murmur.  no pretibial edema bilaterally  Abdomen:  Not distended, soft, non-tender. No rebound or rigidity.  No inguinal hernias GU: Normal scrotal contents, penis without lesions or discharge. No inguinal LAD Perineum palpated: Normal Skin: Not pale. Not jaundice Neurologic:  alert & oriented X3.  Speech normal, gait appropriate for age and unassisted Psych--    Cognition and judgment appear intact.  Cooperative with normal attention span and concentration.  Behavior appropriate. No anxious or depressed appearing.     Assessment & Plan:    Assessment Hyperlipidemia Insomnia-- xanax prn, rx per pcp Allergies Syncope, in the 1980s, on Dilantin since then, used to see Dr. love, reluctant to stop dilantin Osteopenia -- on ca and vit d Elevated LFTs: on-off x years ,  Hepatitis B C -2008, iron, ferritin, alpha-1 antitrypsin normal 2016. Incomplete RBBB-previously a stress test was negative  PLAN: Perineal pain: Recent DRE negative, UA and PSA normal, ROS essentially negative, does not seem to have a serious condition on clinical grounds. Plan: Get a scrotal ultrasound, observe x  4-6 weeks, if he is not improving will proceed with further eval. CT pelvis?Marland Kitchen

## 2015-11-24 ENCOUNTER — Ambulatory Visit (HOSPITAL_BASED_OUTPATIENT_CLINIC_OR_DEPARTMENT_OTHER)
Admission: RE | Admit: 2015-11-24 | Discharge: 2015-11-24 | Disposition: A | Discharge status: Home / Self Care | Payer: Federal, State, Local not specified - PPO | Source: Ambulatory Visit | Attending: Internal Medicine | Admitting: Internal Medicine

## 2015-11-24 DIAGNOSIS — N509 Disorder of male genital organs, unspecified: Secondary | ICD-10-CM | POA: Diagnosis not present

## 2015-11-24 DIAGNOSIS — R234 Changes in skin texture: Secondary | ICD-10-CM | POA: Insufficient documentation

## 2016-01-17 DIAGNOSIS — H40013 Open angle with borderline findings, low risk, bilateral: Secondary | ICD-10-CM | POA: Diagnosis not present

## 2016-02-20 ENCOUNTER — Telehealth: Payer: Self-pay

## 2016-02-20 MED ORDER — ALPRAZOLAM 0.5 MG PO TABS: 0.5000 mg | ORAL_TABLET | Freq: Every evening | ORAL | 1 refills | Status: DC | PRN

## 2016-02-20 NOTE — Telephone Encounter (Signed)
Rx printed, awaiting MD signature.  

## 2016-02-20 NOTE — Telephone Encounter (Signed)
Ok #30, 1 RF 

## 2016-02-20 NOTE — Telephone Encounter (Signed)
Rx faxed to [facility] pharmacy.  

## 2016-02-20 NOTE — Telephone Encounter (Signed)
Pt is requesting refill on Alprazolam.  Last OV: 11/23/2015 Last Fill: 07/31/2015 #30 and 1RF UDS: 07/19/2015 Low risk  Please advise.

## 2016-05-24 ENCOUNTER — Encounter: Payer: Self-pay | Admitting: Internal Medicine

## 2016-05-24 MED ORDER — ROSUVASTATIN CALCIUM 10 MG PO TABS: 10.0000 mg | ORAL_TABLET | Freq: Every day | ORAL | 0 refills | Status: DC

## 2016-06-11 ENCOUNTER — Telehealth: Payer: Self-pay

## 2016-06-11 MED ORDER — ALPRAZOLAM 0.5 MG PO TABS: 0.5000 mg | ORAL_TABLET | Freq: Every evening | ORAL | 0 refills | Status: DC | PRN

## 2016-06-11 NOTE — Telephone Encounter (Signed)
Rx faxed to [facility] pharmacy.  

## 2016-06-11 NOTE — Telephone Encounter (Signed)
Rx printed, awaiting MD signature.  

## 2016-06-11 NOTE — Telephone Encounter (Signed)
Pt is requesting refill on Alprazolam.  Last OV: 11/23/2015 Last Fill: 02/20/2016 #30 and 1RF UDS: 07/19/2015 Low risk  Please advise.

## 2016-06-11 NOTE — Telephone Encounter (Signed)
Okay #30, no refills 

## 2016-06-11 NOTE — Addendum Note (Signed)
Addended byDamita Dunnings D on: 06/11/2016 08:30 AM   Modules accepted: Orders

## 2016-07-09 ENCOUNTER — Other Ambulatory Visit: Payer: Self-pay | Admitting: Internal Medicine

## 2016-07-10 NOTE — Telephone Encounter (Signed)
Okay to refill, drug levels have been consistently within normal.

## 2016-07-10 NOTE — Telephone Encounter (Signed)
Rx sent 

## 2016-07-10 NOTE — Telephone Encounter (Signed)
Pt requesting refill on Dilantin-takes 5 tabs daily (500mg ), BEERS list (contraindication program) will not let me Rx greater than 400mg  (4 tabs daily). Please advise.

## 2016-07-16 DIAGNOSIS — H40013 Open angle with borderline findings, low risk, bilateral: Secondary | ICD-10-CM | POA: Diagnosis not present

## 2016-07-16 DIAGNOSIS — Z83511 Family history of glaucoma: Secondary | ICD-10-CM | POA: Diagnosis not present

## 2016-07-18 ENCOUNTER — Encounter: Payer: Self-pay | Admitting: Internal Medicine

## 2016-07-18 ENCOUNTER — Ambulatory Visit (INDEPENDENT_AMBULATORY_CARE_PROVIDER_SITE_OTHER): Payer: Federal, State, Local not specified - PPO | Admitting: Internal Medicine

## 2016-07-18 ENCOUNTER — Ambulatory Visit (HOSPITAL_BASED_OUTPATIENT_CLINIC_OR_DEPARTMENT_OTHER)
Admission: RE | Admit: 2016-07-18 | Discharge: 2016-07-18 | Disposition: A | Discharge status: Home / Self Care | Payer: Federal, State, Local not specified - PPO | Source: Ambulatory Visit | Attending: Internal Medicine | Admitting: Internal Medicine

## 2016-07-18 VITALS — BP 126/78 | HR 51 | Temp 97.8°F | Resp 14 | Ht 68.0 in | Wt 187.0 lb

## 2016-07-18 DIAGNOSIS — Z13820 Encounter for screening for osteoporosis: Secondary | ICD-10-CM | POA: Diagnosis not present

## 2016-07-18 DIAGNOSIS — R55 Syncope and collapse: Secondary | ICD-10-CM | POA: Diagnosis not present

## 2016-07-18 DIAGNOSIS — Z23 Encounter for immunization: Secondary | ICD-10-CM | POA: Diagnosis not present

## 2016-07-18 DIAGNOSIS — M8580 Other specified disorders of bone density and structure, unspecified site: Secondary | ICD-10-CM | POA: Insufficient documentation

## 2016-07-18 DIAGNOSIS — E785 Hyperlipidemia, unspecified: Secondary | ICD-10-CM | POA: Diagnosis not present

## 2016-07-18 DIAGNOSIS — R102 Pelvic and perineal pain: Secondary | ICD-10-CM | POA: Diagnosis not present

## 2016-07-18 DIAGNOSIS — Z0000 Encounter for general adult medical examination without abnormal findings: Secondary | ICD-10-CM

## 2016-07-18 LAB — LIPID PANEL
CHOL/HDL RATIO: 3
Cholesterol: 194 mg/dL (ref 0–200)
HDL: 73.3 mg/dL (ref >39.00)
LDL Cholesterol: 94 mg/dL (ref 0–99)
NonHDL: 120.3
Triglycerides: 130 mg/dL (ref 0.0–149.0)
VLDL: 26 mg/dL (ref 0.0–40.0)

## 2016-07-18 LAB — URINALYSIS, ROUTINE W REFLEX MICROSCOPIC
BILIRUBIN URINE: NEGATIVE
HGB URINE DIPSTICK: NEGATIVE
Ketones, ur: NEGATIVE
LEUKOCYTES UA: NEGATIVE
Nitrite: NEGATIVE
PH: 7 (ref 5.0–8.0)
RBC / HPF: NONE SEEN (ref 0–?)
Specific Gravity, Urine: 1.01 (ref 1.000–1.030)
Total Protein, Urine: NEGATIVE
Urine Glucose: NEGATIVE
Urobilinogen, UA: 0.2 (ref 0.0–1.0)
WBC, UA: NONE SEEN (ref 0–?)

## 2016-07-18 LAB — COMPREHENSIVE METABOLIC PANEL
ALK PHOS: 80 U/L (ref 39–117)
ALT: 104 U/L — AB (ref 0–53)
AST: 64 U/L — AB (ref 0–37)
Albumin: 4.5 g/dL (ref 3.5–5.2)
BILIRUBIN TOTAL: 0.6 mg/dL (ref 0.2–1.2)
BUN: 17 mg/dL (ref 6–23)
CO2: 27 meq/L (ref 19–32)
CREATININE: 0.88 mg/dL (ref 0.40–1.50)
Calcium: 9.5 mg/dL (ref 8.4–10.5)
Chloride: 104 mEq/L (ref 96–112)
GFR: 93.03 mL/min (ref >60.00)
GLUCOSE: 105 mg/dL — AB (ref 70–99)
Potassium: 4.1 mEq/L (ref 3.5–5.1)
SODIUM: 139 meq/L (ref 135–145)
TOTAL PROTEIN: 6.7 g/dL (ref 6.0–8.3)

## 2016-07-18 LAB — CBC WITH DIFFERENTIAL/PLATELET
BASOS PCT: 0.6 % (ref 0.0–3.0)
Basophils Absolute: 0 10*3/uL (ref 0.0–0.1)
EOS PCT: 2.7 % (ref 0.0–5.0)
Eosinophils Absolute: 0.1 10*3/uL (ref 0.0–0.7)
HCT: 48.1 % (ref 39.0–52.0)
HEMOGLOBIN: 16.5 g/dL (ref 13.0–17.0)
LYMPHS ABS: 1 10*3/uL (ref 0.7–4.0)
Lymphocytes Relative: 22 % (ref 12.0–46.0)
MCHC: 34.2 g/dL (ref 30.0–36.0)
MCV: 99.4 fl (ref 78.0–100.0)
MONO ABS: 0.5 10*3/uL (ref 0.1–1.0)
Monocytes Relative: 11.4 % (ref 3.0–12.0)
NEUTROS ABS: 2.9 10*3/uL (ref 1.4–7.7)
Neutrophils Relative %: 63.3 % (ref 43.0–77.0)
PLATELETS: 232 10*3/uL (ref 150.0–400.0)
RBC: 4.84 Mil/uL (ref 4.22–5.81)
RDW: 12.9 % (ref 11.5–15.5)
WBC: 4.5 10*3/uL (ref 4.0–10.5)

## 2016-07-18 LAB — PSA: PSA: 1.41 ng/mL (ref 0.10–4.00)

## 2016-07-18 LAB — PHENYTOIN LEVEL, TOTAL: PHENYTOIN LVL: 19.1 ug/mL (ref 10.0–20.0)

## 2016-07-18 NOTE — Progress Notes (Signed)
Pre visit review using our clinic review tool, if applicable. No additional management support is needed unless otherwise documented below in the visit note. 

## 2016-07-18 NOTE — Progress Notes (Signed)
Subjective:    Patient ID: Kenneth [name]., male    DOB: [birth year], 61 y.o.   MRN: [id number]  DOS:  07/18/2016 Type of visit - description : cpx Interval history:No major events since last year    Review of Systems Patient continue to have a ill-defined pain at the pelvis since April 2017: Pain is described as an ache, today states that is located at both groins and sometimes in the front of the pelvis by the scrotum. Has noted that hard physical activity in the yard "flareup the pain", it decreases with naproxen. Has not noticed any swelling or hernias No scrotal swelling No rash Denies claudication. No dysuria, gross hematuria difficulty urinating. No major problems with backache.   Other than above, a 14 point review of systems is negative    Past Medical History:  Diagnosis Date  . Allergy   . Hx of colonic polyp 2007  . Hyperlipidemia   . Insomnia 06/02/2014  . Syncope    in 20s; no etiology, no recurrence. Dilantin Rxed    Past Surgical History:  Procedure Laterality Date  . colonoscopy with polypectomy  2007   Dr Carlean Purl, 4 mm rectal polyp  . DENTAL SURGERY     bone grafts, to get implants  . NASAL FRACTURE SURGERY    . TONSILLECTOMY AND ADENOIDECTOMY      Social History   Social History  . Marital status: Married    Spouse name: N/A  . Number of children: 0  . Years of education: N/A   Occupational History  . Secondary school teacher, works from [employer]   Social History Main Topics  . Smoking status: Never Smoker  . Smokeless tobacco: Never Used  . Alcohol use 3.6 - 4.8 oz/week    6 - 8 Cans of beer per week  . Drug use: No  . Sexual activity: Not on file   Other Topics Concern  . Not on file   Social History Narrative   Household pt and wife     Family History  Problem Relation Age of Onset  . Macular degeneration Father   . Cancer Mother     throat; smoker  . Macular degeneration Paternal Uncle   . Rectal cancer  Other     maternal cousin  . Colon cancer Neg Hx   . Esophageal cancer Neg Hx   . Stomach cancer Neg Hx   . Stroke Neg Hx   . Heart disease Neg Hx   . Diabetes Neg Hx   . Prostate cancer Neg Hx      Allergies as of 07/18/2016   No Known Allergies     Medication List       Accurate as of 07/18/16  2:40 PM. Always use your most recent med list.          ALPRAZolam 0.5 MG tablet Commonly known as:  XANAX Take 1 tablet (0.5 mg total) by mouth at bedtime as needed for anxiety.   aspirin 81 MG tablet Take 81 mg by mouth daily.   calcium carbonate 600 MG Tabs tablet Commonly known as:  OS-CAL Take 1,200 mg by mouth daily.   cyanocobalamin 500 MCG tablet Take 1,000 mcg by mouth daily.   DILANTIN 100 MG ER capsule Generic drug:  phenytoin Take 5 tablets by mouth daily as instructed.   fexofenadine 180 MG tablet Commonly known as:  ALLEGRA Take 180 mg by mouth daily.   FISH OIL  300 MG Caps Take by mouth. (Mega Red)   fluocinonide gel 0.05 % Commonly known as:  LIDEX Apply 1 application topically 3 (three) times daily as needed.   naproxen 500 MG tablet Commonly known as:  NAPROSYN Take 500 mg by mouth as needed.   NON FORMULARY Joint Care-take one daily   rosuvastatin 10 MG tablet Commonly known as:  CRESTOR Take 1 tablet (10 mg total) by mouth daily.   TURMERIC PO Take by mouth.   vitamin C 1000 MG tablet Take 1,000 mg by mouth daily.   Vitamin D3 2000 units Tabs Take 1,000 Units by mouth daily.          Objective:   Physical Exam BP 126/78 (BP Location: Left Arm, Patient Position: Sitting, Cuff Size: Normal)   Pulse (!) 51   Temp 97.8 F (36.6 C) (Oral)   Resp 14   Ht 5\' 8"  (1.727 m)   Wt 187 lb (84.8 kg)   SpO2 96%   BMI 28.43 kg/m   General:   Well developed, well nourished . NAD.  Neck: No  thyromegaly  HEENT:  Normocephalic . [name] symmetric, atraumatic Lungs:  CTA B Normal respiratory effort, no intercostal retractions, no  accessory muscle use. Heart: RRR,  no murmur.  No pretibial edema bilaterally  Normal pedal and femoral pulses Abdomen:  Not distended, soft, non-tender. No rebound or rigidity.  No bruit. Groin: No hernias. No mass. GU: Scrotal contents normal, penis normal. DRE: Nontender, normal size prostate gland. No nodularity. Skin: Exposed areas without rash. Not pale. Not jaundice Neurologic:  alert & oriented X3.  Speech normal, gait appropriate for age and unassisted Strength symmetric and appropriate for age.  Psych: Cognition and judgment appear intact.  Cooperative with normal attention span and concentration.  Behavior appropriate. No anxious or depressed appearing.    Assessment & Plan:   Assessment Hyperlipidemia Insomnia-- xanax prn, rx per pcp Allergies Syncope, in the 1980s, on Dilantin since then, used to see Dr. love, reluctant to stop dilantin Osteopenia -- on ca and vit d Elevated LFTs: on-off x years ,  Hepatitis B C -2008, iron, ferritin, alpha-1 antitrypsin normal 2016. Incomplete RBBB-previously a stress test was negative  PLAN: Insomnia: Refill Xanax as needed, contract signed. Will not do a UDS b/c last year unfortunately patient ended up paying for it out of pocket Syncope: On Dilantin since the 80s, checking levels Osteopenia, on calcium and vitamin D, check a dexa Pelvic pain: As described above, patient is concerned about it, CT scan? Today he reports that the pain is mostly at the groins and somewhat worse with heavy activity in the yard. MSK?. Plan: CT abdomen and pelvis, UA, urine culture, PSA. RTC 3 months

## 2016-07-18 NOTE — Assessment & Plan Note (Signed)
PLAN: Insomnia: Refill Xanax as needed, contract signed. Will not do a UDS b/c last year unfortunately patient ended up paying for it out of pocket Syncope: On Dilantin since the 80s, checking levels Osteopenia, on calcium and vitamin D, check a dexa Pelvic pain: As described above, patient is concerned about it, CT scan? Today he reports that the pain is mostly at the groins and somewhat worse with heavy activity in the yard. MSK?. Plan: CT abdomen and pelvis, UA, urine culture, PSA. RTC 3 months

## 2016-07-18 NOTE — Patient Instructions (Signed)
GO TO THE LAB : Get the blood work     GO TO THE FRONT DESK Schedule your next appointment for a  checkup in 3 months  Will schedule a CAT scan of your abdomen and pelvis Will schedule a bone density test

## 2016-07-18 NOTE — Assessment & Plan Note (Addendum)
Td booster 06-2016 Shingles shot discussed , will let me know when ready Last colonoscopy 2013, neg ---- next 2023 Prostate cancer screening: DRE NORMAL TODAY, CHECKING LABS. LABS: CMP, FLP, CBC, UA, urine culture, PSA, phenytoin level. Diet and exercise discussed

## 2016-07-19 LAB — URINE CULTURE: Organism ID, Bacteria: NO GROWTH

## 2016-07-22 ENCOUNTER — Ambulatory Visit (HOSPITAL_BASED_OUTPATIENT_CLINIC_OR_DEPARTMENT_OTHER)
Admission: RE | Admit: 2016-07-22 | Discharge: 2016-07-22 | Disposition: A | Discharge status: Home / Self Care | Payer: Federal, State, Local not specified - PPO | Source: Ambulatory Visit | Attending: Internal Medicine | Admitting: Internal Medicine

## 2016-07-22 ENCOUNTER — Encounter (HOSPITAL_BASED_OUTPATIENT_CLINIC_OR_DEPARTMENT_OTHER): Payer: Self-pay

## 2016-07-22 DIAGNOSIS — R109 Unspecified abdominal pain: Secondary | ICD-10-CM | POA: Diagnosis not present

## 2016-07-22 DIAGNOSIS — K760 Fatty (change of) liver, not elsewhere classified: Secondary | ICD-10-CM | POA: Insufficient documentation

## 2016-07-22 DIAGNOSIS — R102 Pelvic and perineal pain: Secondary | ICD-10-CM | POA: Diagnosis not present

## 2016-07-22 DIAGNOSIS — I700 Atherosclerosis of aorta: Secondary | ICD-10-CM | POA: Insufficient documentation

## 2016-07-22 DIAGNOSIS — I708 Atherosclerosis of other arteries: Secondary | ICD-10-CM | POA: Diagnosis not present

## 2016-07-22 DIAGNOSIS — D3501 Benign neoplasm of right adrenal gland: Secondary | ICD-10-CM | POA: Insufficient documentation

## 2016-07-22 MED ORDER — IOPAMIDOL (ISOVUE-300) INJECTION 61%
100.0000 mL | Freq: Once | INTRAVENOUS | Status: AC | PRN
  Administered 2016-07-22: 100 mL via INTRAVENOUS

## 2016-07-24 ENCOUNTER — Encounter: Payer: Self-pay | Admitting: Internal Medicine

## 2016-07-29 ENCOUNTER — Encounter: Payer: Self-pay | Admitting: Internal Medicine

## 2016-07-29 DIAGNOSIS — R102 Pelvic and perineal pain: Secondary | ICD-10-CM

## 2016-08-06 NOTE — Telephone Encounter (Signed)
[facility] can see him within 2-3 weeks. I have faxed his info to them and they will contact him to schedule. I have sent pt mychart msg to notify.

## 2016-08-12 ENCOUNTER — Other Ambulatory Visit: Payer: Self-pay | Admitting: Internal Medicine

## 2016-08-12 MED ORDER — ROSUVASTATIN CALCIUM 10 MG PO TABS: 10.0000 mg | ORAL_TABLET | Freq: Every day | ORAL | 2 refills | Status: DC

## 2016-08-15 ENCOUNTER — Telehealth: Payer: Self-pay

## 2016-08-15 MED ORDER — ALPRAZOLAM 0.5 MG PO TABS: 0.5000 mg | ORAL_TABLET | Freq: Every evening | ORAL | 2 refills | Status: DC | PRN

## 2016-08-15 NOTE — Telephone Encounter (Signed)
Okay 30 and 2 refills 

## 2016-08-15 NOTE — Telephone Encounter (Signed)
Rx faxed to [facility] pharmacy.  

## 2016-08-15 NOTE — Telephone Encounter (Signed)
Pt is requesting refill on Alprazolam.  Last OV: 07/18/2016 Last Fill: 06/11/2016 #30 and 0RF  UDS: 07/19/2015 Low risk  Please advise.

## 2016-08-15 NOTE — Telephone Encounter (Signed)
Rx printed, awaiting MD signature.  

## 2016-08-19 DIAGNOSIS — S76219A Strain of adductor muscle, fascia and tendon of unspecified thigh, initial encounter: Secondary | ICD-10-CM | POA: Diagnosis not present

## 2016-08-19 DIAGNOSIS — R102 Pelvic and perineal pain: Secondary | ICD-10-CM | POA: Diagnosis not present

## 2016-09-04 DIAGNOSIS — R102 Pelvic and perineal pain: Secondary | ICD-10-CM | POA: Diagnosis not present

## 2016-09-04 DIAGNOSIS — M4306 Spondylolysis, lumbar region: Secondary | ICD-10-CM | POA: Diagnosis not present

## 2016-09-04 DIAGNOSIS — M6580 Other synovitis and tenosynovitis, unspecified site: Secondary | ICD-10-CM | POA: Diagnosis not present

## 2016-09-04 DIAGNOSIS — S76219A Strain of adductor muscle, fascia and tendon of unspecified thigh, initial encounter: Secondary | ICD-10-CM | POA: Diagnosis not present

## 2016-10-25 ENCOUNTER — Encounter: Payer: Self-pay | Admitting: Internal Medicine

## 2016-10-25 ENCOUNTER — Ambulatory Visit (INDEPENDENT_AMBULATORY_CARE_PROVIDER_SITE_OTHER): Payer: Federal, State, Local not specified - PPO | Admitting: Internal Medicine

## 2016-10-25 VITALS — BP 126/74 | HR 47 | Temp 97.8°F | Resp 14 | Ht 68.0 in | Wt 193.1 lb

## 2016-10-25 DIAGNOSIS — R102 Pelvic and perineal pain: Secondary | ICD-10-CM

## 2016-10-25 DIAGNOSIS — R7989 Other specified abnormal findings of blood chemistry: Secondary | ICD-10-CM | POA: Diagnosis not present

## 2016-10-25 DIAGNOSIS — R945 Abnormal results of liver function studies: Secondary | ICD-10-CM

## 2016-10-25 LAB — COMPREHENSIVE METABOLIC PANEL
ALBUMIN: 4.1 g/dL (ref 3.5–5.2)
ALT: 72 U/L — ABNORMAL HIGH (ref 0–53)
AST: 43 U/L — AB (ref 0–37)
Alkaline Phosphatase: 68 U/L (ref 39–117)
BILIRUBIN TOTAL: 0.5 mg/dL (ref 0.2–1.2)
BUN: 17 mg/dL (ref 6–23)
CALCIUM: 9.2 mg/dL (ref 8.4–10.5)
CHLORIDE: 103 meq/L (ref 96–112)
CO2: 28 meq/L (ref 19–32)
CREATININE: 1 mg/dL (ref 0.40–1.50)
GFR: 80.2 mL/min (ref >60.00)
Glucose, Bld: 107 mg/dL — ABNORMAL HIGH (ref 70–99)
Potassium: 4.3 mEq/L (ref 3.5–5.1)
SODIUM: 137 meq/L (ref 135–145)
Total Protein: 6.4 g/dL (ref 6.0–8.3)

## 2016-10-25 NOTE — Progress Notes (Signed)
Subjective:    Patient ID: Kenneth [name]., male    DOB: [birth year], 61 y.o.   MRN: [id number]  DOS:  10/25/2016 Type of visit - description : Follow-up from previous visit Interval history:  Was seen with pelvic pain, workup and two office visit notes regards his pain  reviewed. Symptoms felt to be MSK, improving a little with self physical therapy. Has been taking naproxen consistently. Denies any nausea, vomiting, stomach pain or blood in the stools.  Review of Systems As above  Past Medical History:  Diagnosis Date  . Allergy   . Hx of colonic polyp 2007  . Hyperlipidemia   . Insomnia 06/02/2014  . Syncope    in 20s; no etiology, no recurrence. Dilantin Rxed    Past Surgical History:  Procedure Laterality Date  . colonoscopy with polypectomy  2007   Dr Carlean Purl, 4 mm rectal polyp  . DENTAL SURGERY     bone grafts, to get implants  . NASAL FRACTURE SURGERY    . TONSILLECTOMY AND ADENOIDECTOMY      Social History   Social History  . Marital status: Married    Spouse name: N/A  . Number of children: 0  . Years of education: N/A   Occupational History  . Secondary school teacher, works from [employer]   Social History Main Topics  . Smoking status: Never Smoker  . Smokeless tobacco: Never Used  . Alcohol use 3.6 - 4.8 oz/week    6 - 8 Cans of beer per week  . Drug use: No  . Sexual activity: Not on file   Other Topics Concern  . Not on file   Social History Narrative   Household pt and wife      Allergies as of 10/25/2016   No Known Allergies     Medication List       Accurate as of 10/25/16 11:59 PM. Always use your most recent med list.          ALPRAZolam 0.5 MG tablet Commonly known as:  XANAX Take 1 tablet (0.5 mg total) by mouth at bedtime as needed for anxiety.   aspirin 81 MG tablet Take 81 mg by mouth daily.   calcium carbonate 600 MG Tabs tablet Commonly known as:  OS-CAL Take 1,200 mg by mouth daily.     cyanocobalamin 500 MCG tablet Take 1,000 mcg by mouth daily.   DILANTIN 100 MG ER capsule Generic drug:  phenytoin Take 5 tablets by mouth daily as instructed.   fexofenadine 180 MG tablet Commonly known as:  ALLEGRA Take 180 mg by mouth daily.   FISH OIL 300 MG Caps Take by mouth. (Mega Red)   fluocinonide gel 0.05 % Commonly known as:  LIDEX Apply 1 application topically 3 (three) times daily as needed.   naproxen 500 MG tablet Commonly known as:  NAPROSYN Take 500 mg by mouth as needed.   NON FORMULARY   rosuvastatin 10 MG tablet Commonly known as:  CRESTOR Take 1 tablet (10 mg total) by mouth daily.   TURMERIC PO Take by mouth.   vitamin C 1000 MG tablet Take 1,000 mg by mouth daily.   Vitamin D3 2000 units Tabs Take 1,000 Units by mouth daily.          Objective:   Physical Exam BP 126/74 (BP Location: Left Arm, Patient Position: Sitting, Cuff Size: Normal)   Pulse (!) 47   Temp 97.8 F (36.6 C) (Oral)  Resp 14   Ht 5\' 8"  (1.727 m)   Wt 193 lb 2 oz (87.6 kg)   SpO2 97%   BMI 29.36 kg/m  General:   Well developed, well nourished . NAD.  HEENT:  Normocephalic . [name] symmetric, atraumatic Lungs:  CTA B Normal respiratory effort, no intercostal retractions, no accessory muscle use. Heart: RRR,  no murmur.  No pretibial edema bilaterally  Skin: Not pale. Not jaundice Neurologic:  alert & oriented X3.  Speech normal, gait appropriate for age and unassisted Psych--  Cognition and judgment appear intact.  Cooperative with normal attention span and concentration.  Behavior appropriate. No anxious or depressed appearing.      Assessment & Plan:  Assessment Hyperlipidemia Insomnia-- xanax prn, rx per pcp Allergies Syncope, in the 1980s, on Dilantin since then, used to see Dr. love, reluctant to stop dilantin Elevated LFTs: on-off x years, hepatitis B C -2008, iron, ferritin, alpha-1 antitrypsin normal 2016. CT 4-/2018: Hepatic  steatosis Incomplete RBBB-previously a stress test was negative  PLAN: Pelvic pain: Since the last visit, CT was unremarkable except for some bladder thickening, saw urology, they refer to sports meds  thinking that the pain was related to a sprain at the abductors. He has been doing self PT w/ some improvement but would like to be referred for formal  PT. Will do. Has been taking NSAIDs consistently, will check his kidney function. Recommend to minimize use of NSAIDs due to long-term potential complications. He verbalized understanding. Sprain, abductors: See above Increase LFTs: Since the last visit, CT showing hepatic steatosis. He is on Crestor. Check a CMP. Osteopenia? DEXA from  07/18/2016 normal. RTC 6 months

## 2016-10-25 NOTE — Progress Notes (Signed)
Pre visit review using our clinic review tool, if applicable. No additional management support is needed unless otherwise documented below in the visit note. 

## 2016-10-25 NOTE — Patient Instructions (Addendum)
Get blood work  GO TO THE FRONT DESK Schedule your next appointment for a  Physical by 06-2017

## 2016-10-26 NOTE — Assessment & Plan Note (Signed)
Pelvic pain: Since the last visit, CT was unremarkable except for some bladder thickening, saw urology, they refer to sports meds  thinking that the pain was related to a sprain at the abductors. He has been doing self PT w/ some improvement but would like to be referred for formal  PT. Will do. Has been taking NSAIDs consistently, will check his kidney function. Recommend to minimize use of NSAIDs due to long-term potential complications. He verbalized understanding. Sprain, abductors: See above Increase LFTs: Since the last visit, CT showing hepatic steatosis. He is on Crestor. Check a CMP. Osteopenia? DEXA from  07/18/2016 normal. RTC 6 months

## 2016-11-08 DIAGNOSIS — R102 Pelvic and perineal pain: Secondary | ICD-10-CM | POA: Diagnosis not present

## 2016-12-31 ENCOUNTER — Telehealth: Payer: Self-pay | Admitting: Internal Medicine

## 2016-12-31 MED ORDER — DILANTIN 100 MG PO CAPS: ORAL_CAPSULE | ORAL | 1 refills | Status: DC

## 2016-12-31 NOTE — Telephone Encounter (Signed)
°  Relation to JY:NWGN Call back [place]: [facility] [place] [place] to [facility] [phone] (Phone) [phone] (Fax)     Reason for call:  Patient requesting 90 day supply DILANTIN 100 MG ER capsule, please advise

## 2016-12-31 NOTE — Telephone Encounter (Signed)
Rx sent 

## 2017-01-16 DIAGNOSIS — H40013 Open angle with borderline findings, low risk, bilateral: Secondary | ICD-10-CM | POA: Diagnosis not present

## 2017-02-13 ENCOUNTER — Other Ambulatory Visit: Payer: Self-pay | Admitting: Internal Medicine

## 2017-02-13 NOTE — Telephone Encounter (Signed)
Pt is requesting refill on alprazolam 0.5mg .  Last OV: 10/25/2016 Last Fill: 08/15/2016 #30 and 2RF UDS: 07/19/2015 Low risk  NCCR printed; no issues noted.  Please advise.

## 2017-02-13 NOTE — Telephone Encounter (Signed)
Rx printed, awaiting MD signature.  

## 2017-02-13 NOTE — Telephone Encounter (Signed)
Okay prescriptions 30 and 3 refills

## 2017-02-13 NOTE — Telephone Encounter (Signed)
Rx faxed to [facility] pharmacy.  

## 2017-05-13 ENCOUNTER — Other Ambulatory Visit: Payer: Self-pay | Admitting: Internal Medicine

## 2017-07-04 ENCOUNTER — Other Ambulatory Visit: Payer: Self-pay | Admitting: Internal Medicine

## 2017-07-21 ENCOUNTER — Ambulatory Visit (INDEPENDENT_AMBULATORY_CARE_PROVIDER_SITE_OTHER): Payer: Federal, State, Local not specified - PPO | Admitting: Internal Medicine

## 2017-07-21 ENCOUNTER — Encounter: Payer: Self-pay | Admitting: Internal Medicine

## 2017-07-21 VITALS — BP 126/68 | HR 59 | Temp 97.7°F | Resp 14 | Ht 68.0 in | Wt 198.0 lb

## 2017-07-21 DIAGNOSIS — R945 Abnormal results of liver function studies: Secondary | ICD-10-CM

## 2017-07-21 DIAGNOSIS — Z0000 Encounter for general adult medical examination without abnormal findings: Secondary | ICD-10-CM

## 2017-07-21 DIAGNOSIS — L989 Disorder of the skin and subcutaneous tissue, unspecified: Secondary | ICD-10-CM

## 2017-07-21 DIAGNOSIS — E785 Hyperlipidemia, unspecified: Secondary | ICD-10-CM

## 2017-07-21 DIAGNOSIS — Z0001 Encounter for general adult medical examination with abnormal findings: Secondary | ICD-10-CM | POA: Diagnosis not present

## 2017-07-21 DIAGNOSIS — G4700 Insomnia, unspecified: Secondary | ICD-10-CM

## 2017-07-21 DIAGNOSIS — R55 Syncope and collapse: Secondary | ICD-10-CM | POA: Diagnosis not present

## 2017-07-21 DIAGNOSIS — R7989 Other specified abnormal findings of blood chemistry: Secondary | ICD-10-CM

## 2017-07-21 LAB — LIPID PANEL
CHOL/HDL RATIO: 3
Cholesterol: 214 mg/dL — ABNORMAL HIGH (ref 0–200)
HDL: 77 mg/dL (ref >39.00)
LDL CALC: 113 mg/dL — AB (ref 0–99)
NonHDL: 137.33
TRIGLYCERIDES: 123 mg/dL (ref 0.0–149.0)
VLDL: 24.6 mg/dL (ref 0.0–40.0)

## 2017-07-21 LAB — COMPREHENSIVE METABOLIC PANEL
ALT: 89 U/L — ABNORMAL HIGH (ref 0–53)
AST: 50 U/L — AB (ref 0–37)
Albumin: 4.3 g/dL (ref 3.5–5.2)
Alkaline Phosphatase: 74 U/L (ref 39–117)
BUN: 15 mg/dL (ref 6–23)
CALCIUM: 9.3 mg/dL (ref 8.4–10.5)
CHLORIDE: 104 meq/L (ref 96–112)
CO2: 27 meq/L (ref 19–32)
CREATININE: 0.85 mg/dL (ref 0.40–1.50)
GFR: 96.51 mL/min (ref >60.00)
Glucose, Bld: 125 mg/dL — ABNORMAL HIGH (ref 70–99)
POTASSIUM: 4.3 meq/L (ref 3.5–5.1)
Sodium: 138 mEq/L (ref 135–145)
Total Bilirubin: 0.5 mg/dL (ref 0.2–1.2)
Total Protein: 7 g/dL (ref 6.0–8.3)

## 2017-07-21 LAB — TSH: TSH: 1.73 u[IU]/mL (ref 0.35–4.50)

## 2017-07-21 MED ORDER — ALPRAZOLAM 0.5 MG PO TABS: 0.5000 mg | ORAL_TABLET | Freq: Every evening | ORAL | 3 refills | Status: DC | PRN

## 2017-07-21 MED ORDER — NAPROXEN 500 MG PO TABS: 500.0000 mg | ORAL_TABLET | Freq: Every day | ORAL | 3 refills | Status: DC | PRN

## 2017-07-21 NOTE — Patient Instructions (Signed)
GO TO THE LAB : Get the blood work     GO TO THE FRONT DESK Schedule your next appointment for a  checkup in 6-8 months    

## 2017-07-21 NOTE — Progress Notes (Signed)
Pre visit review using our clinic review tool, if applicable. No additional management support is needed unless otherwise documented below in the visit note. 

## 2017-07-21 NOTE — Assessment & Plan Note (Signed)
PLAN: Hyperlipidemia: On Crestor, checking labs Insomnia: On Xanax, RF today, contract signed, no UDS for now d/t cost, see previous commentss.  NCCR reviewed, no issues. Syncope, on Dilantin, checking labs Elevated LFTs: We will check a SPE although most likely etiology is multifactorial: hepatic steatosis   EtOH use and medications (NSAIDs, Crestor ) Sprain, abductors: Eventually resolved Tick removed, watch for rash, fever, chills,  unusual arthralgias, headaches. RTC 6-8 months (d/t  Xanax RX)

## 2017-07-21 NOTE — Assessment & Plan Note (Addendum)
-  Td  06-2016;  -CC: Last colonoscopy 2013, neg ---- next 2023 Prostate cancer screening: DRE PSA 2018 wnl -labs: CMP, FLP, phenytoin level, TSH, SBE -Diet and exercise discussed

## 2017-07-21 NOTE — Progress Notes (Signed)
Subjective:    Patient ID: Kenneth [name]., male    DOB: 1955-08-09, 62 y.o.   MRN: [id number]  CPX DOS:  07/21/2017   Interval history: In general feeling well, has few concerns.   Review of Systems Skin lesion on the right forehead is getting darker. He also showed me a skin lesion on the left chest, on close examination it was a tick  Other than above, a 14 point review of systems is negative     Family History  Problem Relation Age of Onset  . Macular degeneration Father   . Cancer Mother        throat; smoker  . Macular degeneration Paternal Uncle   . Rectal cancer Other        maternal cousin  . Colon cancer Neg Hx   . Esophageal cancer Neg Hx   . Stomach cancer Neg Hx   . Stroke Neg Hx   . Heart disease Neg Hx   . Diabetes Neg Hx   . Prostate cancer Neg Hx     Past Medical History:  Diagnosis Date  . Allergy   . Hx of colonic polyp 2007  . Hyperlipidemia   . Insomnia 06/02/2014  . Syncope    in 20s; no etiology, no recurrence. Dilantin Rxed    Past Surgical History:  Procedure Laterality Date  . colonoscopy with polypectomy  2007   Dr Carlean Purl, 4 mm rectal polyp  . DENTAL SURGERY     bone grafts, to get implants  . NASAL FRACTURE SURGERY    . TONSILLECTOMY AND ADENOIDECTOMY      Social History   Socioeconomic History  . Marital status: Married    Spouse name: Not on file  . Number of children: 0  . Years of education: Not on file  . Highest education level: Not on file  Occupational History  . 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 Financial resource strain: Not on file  . Food insecurity:    Worry: Not on file    Inability: Not on file  . Transportation needs:    Medical: Not on file    Non-medical: Not on file  Tobacco Use  . Smoking status: Never Smoker  . Smokeless tobacco: Never Used  Substance and Sexual Activity  . Alcohol use: Yes    Alcohol/week: 3.6 - 4.8  oz    Types: 6 - 8 Cans of beer per week  . Drug use: No  . Sexual activity: Not on file  Lifestyle  . Physical activity:    Days per week: Not on file    Minutes per session: Not on file  . Stress: Not on file  Relationships  . Social connections:    Talks on phone: Not on file    Gets together: Not on file    Attends religious service: Not on file    Active member of club or organization: Not on file    Attends meetings of clubs or organizations: Not on file    Relationship status: Not on file  . Intimate partner violence:    Fear of current or ex partner: Not on file    Emotionally abused: Not on file    Physically abused: Not on file    Forced sexual activity: Not on file  Other Topics Concern  . Not on file  Social History Narrative   Household pt and wife  Allergies as of 07/21/2017   No Known Allergies     Medication List        Accurate as of 07/21/17  5:51 PM. Always use your most recent med list.          ALPRAZolam 0.5 MG tablet Commonly known as:  XANAX Take 1 tablet (0.5 mg total) by mouth at bedtime as needed for anxiety.   aspirin 81 MG tablet Take 81 mg by mouth daily.   calcium carbonate 600 MG Tabs tablet Commonly known as:  OS-CAL Take 1,200 mg by mouth daily.   cyanocobalamin 500 MCG tablet Take 1,000 mcg by mouth daily.   DILANTIN 100 MG ER capsule Generic drug:  phenytoin TAKE 5 CAPSULES DAILY AS   INSTRUCTED   fexofenadine 180 MG tablet Commonly known as:  ALLEGRA Take 180 mg by mouth daily.   FISH OIL 300 MG Caps Take by mouth. (Mega Red)   fluocinonide gel 0.05 % Commonly known as:  LIDEX Apply 1 application topically 3 (three) times daily as needed.   naproxen 500 MG tablet Commonly known as:  NAPROSYN Take 1 tablet (500 mg total) by mouth daily as needed.   rosuvastatin 10 MG tablet Commonly known as:  CRESTOR Take 1 tablet (10 mg total) by mouth daily.   TRIPLE FLEX 500-400-125 MG Tabs Generic drug:   Glucosamine-Chondroitin-MSM Take 1 tablet by mouth daily.   TURMERIC PO Take by mouth.   vitamin C 1000 MG tablet Take 1,000 mg by mouth daily.   Vitamin D3 2000 units Tabs Take 1,000 Units by mouth daily.          Objective:   Physical Exam  HENT:  Head:    Skin:      BP 126/68 (BP Location: Left Arm, Patient Position: Sitting, Cuff Size: Small)   Pulse (!) 59   Temp 97.7 F (36.5 C) (Oral)   Resp 14   Ht 5\' 8"  (1.727 m)   Wt 198 lb (89.8 kg)   SpO2 97%   BMI 30.11 kg/m  General:   Well developed, well nourished . NAD.  Neck: No  thyromegaly  HEENT:  Normocephalic . [name] symmetric, atraumatic Lungs:  CTA B Normal respiratory effort, no intercostal retractions, no accessory muscle use. Heart: RRR,  no murmur.  No pretibial edema bilaterally  Abdomen:  Not distended, soft, non-tender. No rebound or rigidity.   Skin: See graphic  neurologic:  alert & oriented X3.  Speech normal, gait appropriate for age and unassisted Strength symmetric and appropriate for age.  Psych: Cognition and judgment appear intact.  Cooperative with normal attention span and concentration.  Behavior appropriate. No anxious or depressed appearing.     Assessment & Plan:    Assessment Hyperlipidemia Insomnia-- xanax prn, rx per pcp Allergies Syncope, in the 1980s, on Dilantin since then, used to see Dr. love, reluctant to stop dilantin Elevated LFTs: on-off x years, hepatitis B C -2008, iron, ferritin, alpha-1 antitrypsin normal 2016. CT 4-/2018: Hepatic steatosis Incomplete RBBB-previously a stress test was negative Osteopenia?   DEXA 07/18/2016 normal  PLAN: Hyperlipidemia: On Crestor, checking labs Insomnia: On Xanax, RF today, contract signed, no UDS for now d/t cost, see previous commentss.  NCCR reviewed, no issues. Syncope, on Dilantin, checking labs Elevated LFTs: We will check a SPE although most likely etiology is multifactorial: hepatic steatosis   EtOH use and  medications (NSAIDs, Crestor ) Sprain, abductors: Eventually resolved Tick removed, watch for rash, fever, chills,  unusual arthralgias,  headaches. RTC 6-8 months (d/t  Xanax RX)

## 2017-07-24 DIAGNOSIS — H40013 Open angle with borderline findings, low risk, bilateral: Secondary | ICD-10-CM | POA: Diagnosis not present

## 2017-07-24 DIAGNOSIS — Z83511 Family history of glaucoma: Secondary | ICD-10-CM | POA: Diagnosis not present

## 2017-07-24 LAB — PROTEIN ELECTROPHORESIS, SERUM
ALBUMIN ELP: 4.5 g/dL (ref 3.8–4.8)
ALPHA 1: 0.3 g/dL (ref 0.2–0.3)
ALPHA 2: 0.8 g/dL (ref 0.5–0.9)
BETA GLOBULIN: 0.3 g/dL — AB (ref 0.4–0.6)
Beta 2: 0.2 g/dL (ref 0.2–0.5)
Gamma Globulin: 0.8 g/dL (ref 0.8–1.7)
TOTAL PROTEIN: 6.9 g/dL (ref 6.1–8.1)

## 2017-07-24 LAB — PHENYTOIN LEVEL, TOTAL: Phenytoin, Total: 11.6 mg/L (ref 10.0–20.0)

## 2017-07-25 ENCOUNTER — Encounter: Payer: Self-pay | Admitting: Internal Medicine

## 2017-09-10 DIAGNOSIS — W57XXXA Bitten or stung by nonvenomous insect and other nonvenomous arthropods, initial encounter: Secondary | ICD-10-CM | POA: Diagnosis not present

## 2017-09-10 DIAGNOSIS — L821 Other seborrheic keratosis: Secondary | ICD-10-CM | POA: Diagnosis not present

## 2017-09-10 DIAGNOSIS — D225 Melanocytic nevi of trunk: Secondary | ICD-10-CM | POA: Diagnosis not present

## 2017-09-10 DIAGNOSIS — S40869A Insect bite (nonvenomous) of unspecified upper arm, initial encounter: Secondary | ICD-10-CM | POA: Diagnosis not present

## 2017-11-03 ENCOUNTER — Other Ambulatory Visit: Payer: Self-pay | Admitting: Internal Medicine

## 2017-12-25 ENCOUNTER — Other Ambulatory Visit: Payer: Self-pay | Admitting: Internal Medicine

## 2017-12-25 MED ORDER — DILANTIN 100 MG PO CAPS: ORAL_CAPSULE | ORAL | 1 refills | Status: DC

## 2017-12-25 NOTE — Telephone Encounter (Signed)
Copied from [facility] [id number]. Topic: Quick Communication - See Telephone Encounter >> Dec 25, 2017  4:42 PM Vernona Rieger wrote: CRM for notification. See Telephone encounter for: 12/25/17.  [facility] states that [facility] not getting a response for a refill on his DILANTIN 100 MG ER capsule.

## 2017-12-25 NOTE — Telephone Encounter (Signed)
Have not seen a refill request for this Pt. Rx sent.

## 2018-01-27 DIAGNOSIS — H40013 Open angle with borderline findings, low risk, bilateral: Secondary | ICD-10-CM | POA: Diagnosis not present

## 2018-02-25 ENCOUNTER — Telehealth: Payer: Self-pay

## 2018-02-25 ENCOUNTER — Ambulatory Visit: Payer: Federal, State, Local not specified - PPO | Admitting: Internal Medicine

## 2018-02-25 DIAGNOSIS — Z0000 Encounter for general adult medical examination without abnormal findings: Secondary | ICD-10-CM

## 2018-02-25 DIAGNOSIS — R739 Hyperglycemia, unspecified: Secondary | ICD-10-CM

## 2018-02-25 DIAGNOSIS — E1165 Type 2 diabetes mellitus with hyperglycemia: Secondary | ICD-10-CM

## 2018-02-25 NOTE — Telephone Encounter (Signed)
Labs ordered. Author made pt. aware that he could stop by for lab draw anytime prior to OV with Dr. Larose Kells. Pt. Verbalized understanding.

## 2018-02-25 NOTE — Telephone Encounter (Signed)
Please draw labs anytime before his next visit. CMP, FLP, CBC---DX CPX A1c--- DX hyperglycemia

## 2018-02-25 NOTE — Telephone Encounter (Signed)
Pt. phoned to reschedule OV with Dr. Larose Kells, appointment made for next Wednesday. Pt. Is requesting to have lab orders placed so he can get them drawn prior to OV. "I'm fasting, all ready to go today". Author stated she would route to Dr. Larose Kells, but he probably will not be able to order this AM as he is sick. Will reach out to pt. when Dr. Larose Kells responds, and pt. verbalized understanding.

## 2018-03-02 ENCOUNTER — Other Ambulatory Visit (INDEPENDENT_AMBULATORY_CARE_PROVIDER_SITE_OTHER): Payer: Federal, State, Local not specified - PPO

## 2018-03-02 DIAGNOSIS — Z0000 Encounter for general adult medical examination without abnormal findings: Secondary | ICD-10-CM

## 2018-03-02 DIAGNOSIS — R739 Hyperglycemia, unspecified: Secondary | ICD-10-CM | POA: Diagnosis not present

## 2018-03-02 LAB — COMPREHENSIVE METABOLIC PANEL
ALBUMIN: 4.5 g/dL (ref 3.5–5.2)
ALK PHOS: 73 U/L (ref 39–117)
ALT: 74 U/L — ABNORMAL HIGH (ref 0–53)
AST: 53 U/L — ABNORMAL HIGH (ref 0–37)
BUN: 18 mg/dL (ref 6–23)
CALCIUM: 9.4 mg/dL (ref 8.4–10.5)
CHLORIDE: 105 meq/L (ref 96–112)
CO2: 29 mEq/L (ref 19–32)
CREATININE: 0.95 mg/dL (ref 0.40–1.50)
GFR: 84.72 mL/min (ref >60.00)
Glucose, Bld: 115 mg/dL — ABNORMAL HIGH (ref 70–99)
POTASSIUM: 4.2 meq/L (ref 3.5–5.1)
Sodium: 142 mEq/L (ref 135–145)
TOTAL PROTEIN: 6.6 g/dL (ref 6.0–8.3)
Total Bilirubin: 0.5 mg/dL (ref 0.2–1.2)

## 2018-03-02 LAB — LIPID PANEL
Cholesterol: 201 mg/dL — ABNORMAL HIGH (ref 0–200)
HDL: 67.5 mg/dL (ref >39.00)
LDL CALC: 101 mg/dL — AB (ref 0–99)
NonHDL: 133.31
TRIGLYCERIDES: 161 mg/dL — AB (ref 0.0–149.0)
Total CHOL/HDL Ratio: 3
VLDL: 32.2 mg/dL (ref 0.0–40.0)

## 2018-03-02 LAB — CBC
HEMATOCRIT: 47.8 % (ref 39.0–52.0)
Hemoglobin: 16.4 g/dL (ref 13.0–17.0)
MCHC: 34.2 g/dL (ref 30.0–36.0)
MCV: 100.1 fl — ABNORMAL HIGH (ref 78.0–100.0)
Platelets: 203 10*3/uL (ref 150.0–400.0)
RBC: 4.78 Mil/uL (ref 4.22–5.81)
RDW: 12.4 % (ref 11.5–15.5)
WBC: 4.9 10*3/uL (ref 4.0–10.5)

## 2018-03-02 LAB — HEMOGLOBIN A1C: HEMOGLOBIN A1C: 5.4 % (ref 4.6–6.5)

## 2018-03-04 ENCOUNTER — Ambulatory Visit: Payer: Federal, State, Local not specified - PPO | Admitting: Internal Medicine

## 2018-03-04 ENCOUNTER — Encounter: Payer: Self-pay | Admitting: Internal Medicine

## 2018-03-04 VITALS — BP 144/88 | HR 56 | Temp 97.9°F | Resp 16 | Ht 68.0 in | Wt 201.2 lb

## 2018-03-04 DIAGNOSIS — R7989 Other specified abnormal findings of blood chemistry: Secondary | ICD-10-CM

## 2018-03-04 DIAGNOSIS — G4700 Insomnia, unspecified: Secondary | ICD-10-CM

## 2018-03-04 DIAGNOSIS — R945 Abnormal results of liver function studies: Secondary | ICD-10-CM

## 2018-03-04 MED ORDER — ZOSTER VAC RECOMB ADJUVANTED 50 MCG/0.5ML IM SUSR: 0.5000 mL | Freq: Once | INTRAMUSCULAR | 1 refills | Status: AC

## 2018-03-04 NOTE — Progress Notes (Signed)
Subjective:    Patient ID: Kenneth [name]., male    DOB: 03-03-1956, 62 y.o.   MRN: [id number]  DOS:  03/04/2018 Type of visit - description : rov Interval history: Here for a routine visit, labs done prior to the visit, they were reviewed with the patient. She takes a sporadic naproxen without apparent side effects. He has multiple questions about OTC supplements.   Review of Systems Denies chest pain no difficulty breathing No nausea, vomiting, diarrhea  Past Medical History:  Diagnosis Date  . Allergy   . Hx of colonic polyp 2007  . Hyperlipidemia   . Insomnia 06/02/2014  . Syncope    in 20s; no etiology, no recurrence. Dilantin Rxed    Past Surgical History:  Procedure Laterality Date  . colonoscopy with polypectomy  2007   Dr Carlean Purl, 4 mm rectal polyp  . DENTAL SURGERY     bone grafts, to get implants  . NASAL FRACTURE SURGERY    . TONSILLECTOMY AND ADENOIDECTOMY      Social History   Socioeconomic History  . Marital status: Married    Spouse name: Not on file  . Number of children: 0  . Years of education: Not on file  . Highest education level: Not on file  Occupational History  . 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 Financial resource strain: Not on file  . Food insecurity:    Worry: Not on file    Inability: Not on file  . Transportation needs:    Medical: Not on file    Non-medical: Not on file  Tobacco Use  . Smoking status: Never Smoker  . Smokeless tobacco: Never Used  Substance and Sexual Activity  . Alcohol use: Yes    Alcohol/week: 6.0 - 8.0 standard drinks    Types: 6 - 8 Cans of beer per week  . Drug use: No  . Sexual activity: Not on file  Lifestyle  . Physical activity:    Days per week: Not on file    Minutes per session: Not on file  . Stress: Not on file  Relationships  . Social connections:    Talks on phone: Not on file    Gets together: Not on  file    Attends religious service: Not on file    Active member of club or organization: Not on file    Attends meetings of clubs or organizations: Not on file    Relationship status: Not on file  . Intimate partner violence:    Fear of current or ex partner: Not on file    Emotionally abused: Not on file    Physically abused: Not on file    Forced sexual activity: Not on file  Other Topics Concern  . Not on file  Social History Narrative   Household pt and wife      Allergies as of 03/04/2018   No Known Allergies     Medication List        Accurate as of 03/04/18  6:33 PM. Always use your most recent med list.          ALPRAZolam 0.5 MG tablet Commonly known as:  XANAX Take 1 tablet (0.5 mg total) by mouth at bedtime as needed for anxiety.   aspirin 81 MG tablet Take 81 mg by mouth daily.   calcium carbonate 600 MG Tabs tablet Commonly known as:  OS-CAL Take 1,200 mg  by mouth daily.   DILANTIN 100 MG ER capsule Generic drug:  phenytoin TAKE 5 CAPSULES DAILY AS   INSTRUCTED   fexofenadine 180 MG tablet Commonly known as:  ALLEGRA Take 180 mg by mouth daily.   FISH OIL 300 MG Caps Take by mouth. (Mega Red)   fluocinonide gel 0.05 % Commonly known as:  LIDEX Apply 1 application topically 3 (three) times daily as needed.   naproxen 500 MG tablet Commonly known as:  NAPROSYN Take 1 tablet (500 mg total) by mouth daily as needed.   rosuvastatin 10 MG tablet Commonly known as:  CRESTOR Take 1 tablet (10 mg total) by mouth daily.   TRIPLE FLEX 500-400-125 MG Tabs Generic drug:  Glucosamine-Chondroitin-MSM Take 1 tablet by mouth daily.   TURMERIC PO Take by mouth.   vitamin B-12 500 MCG tablet Commonly known as:  CYANOCOBALAMIN Take 1,000 mcg by mouth daily.   vitamin C 1000 MG tablet Take 1,000 mg by mouth daily.   Vitamin D3 50 MCG (2000 UT) Tabs Take 1,000 Units by mouth daily.   Zoster Vaccine Adjuvanted injection Commonly known as:   SHINGRIX Inject 0.5 mLs into the muscle once for 1 dose.          Objective:   Physical Exam BP (!) 144/88 (BP Location: Left Arm, Patient Position: Sitting, Cuff Size: Small)   Pulse (!) 56   Temp 97.9 F (36.6 C) (Oral)   Resp 16   Ht 5\' 8"  (1.727 m)   Wt 201 lb 4 oz (91.3 kg)   SpO2 98%   BMI 30.60 kg/m  General:   Well developed, NAD, BMI noted. HEENT:  Normocephalic . [name] symmetric, atraumatic Lungs:  CTA B Normal respiratory effort, no intercostal retractions, no accessory muscle use. Heart: RRR,  no murmur.  No pretibial edema bilaterally  Skin: Not pale. Not jaundice Neurologic:  alert & oriented X3.  Speech normal, gait appropriate for age and unassisted Psych--  Cognition and judgment appear intact.  Cooperative with normal attention span and concentration.  Behavior appropriate. No anxious or depressed appearing.      Assessment & Plan:   Assessment Hyperlipidemia Insomnia-- xanax prn, rx per pcp Allergies Syncope, in the 1980s, on Dilantin since then, used to see Dr. love, reluctant to stop dilantin Elevated LFTs: on-off x years, hepatitis B C -2008, iron, ferritin, alpha-1 antitrypsin normal 2016. CT 4-/2018: Hepatic steatosis Incomplete RBBB-previously a stress test was negative Osteopenia?   DEXA 07/18/2016 normal  PLAN: Insomnia: Good compliance with Xanax, contract in place, no  UDS recommend (due to issues with UDS coverage) Elevated LFTs: stable per last lab.  Hyperglycemia?  recent CBG 115 however A1c is 5.4 thus no evidence of diabetes. Naproxen: Takes it sporadically for aches and pains, GI precautions discussed. Multiple questions regards vitamins and OTC : Rec good nutrition w/ fruits and vegetables, okay to take a B12, vitamin D supplement but other than that a multivitamin should be sufficient. Takes things like tumeric, glucosamine: Rec  to continue w/ them only if has subjective improvement of symptoms. Discussed Shingrix, will get  at the pharmacy RTC CPX 07-2017.

## 2018-03-04 NOTE — Patient Instructions (Signed)
  GO TO THE FRONT DESK Schedule your next appointment for a  Physical by April 2020

## 2018-03-04 NOTE — Progress Notes (Signed)
Pre visit review using our clinic review tool, if applicable. No additional management support is needed unless otherwise documented below in the visit note. 

## 2018-03-04 NOTE — Assessment & Plan Note (Signed)
Insomnia: Good compliance with Xanax, contract in place, no  UDS recommend (due to issues with UDS coverage) Elevated LFTs: stable per last lab.  Hyperglycemia?  recent CBG 115 however A1c is 5.4 thus no evidence of diabetes. Naproxen: Takes it sporadically for aches and pains, GI precautions discussed. Multiple questions regards vitamins and OTC : Rec good nutrition w/ fruits and vegetables, okay to take a B12, vitamin D supplement but other than that a multivitamin should be sufficient. Takes things like tumeric, glucosamine: Rec  to continue w/ them only if has subjective improvement of symptoms. Discussed Shingrix, will get at the pharmacy RTC CPX 07-2017.

## 2018-03-06 IMAGING — CT CT ABD-PELV W/ CM
2 of 5 series · 15 of 46 positions shown, 17 images · IV contrast (APPLIED)
Comparison: None.

CLINICAL DATA: Inguinal region pain, chronic.

EXAM:
CT ABDOMEN AND PELVIS WITH CONTRAST
TECHNIQUE: Multidetector CT imaging of the abdomen and pelvis was performed
using the standard protocol following bolus administration of
intravenous contrast. Oral contrast was also administered.
CONTRAST:  100mL [id number] IOPAMIDOL ([id number]) INJECTION 61%

[Series 2: axial st · axial · 0.85mm/px · z∈[-527,-92]mm · 12 of 99 slices shown, 14 images]
[im 6/99  soft-tissue]
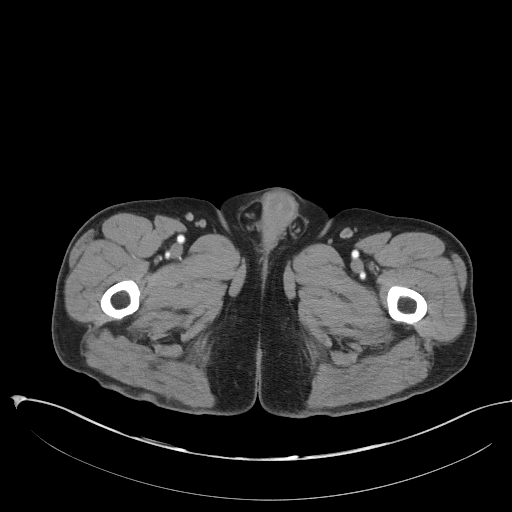
[im 6/99  bone]
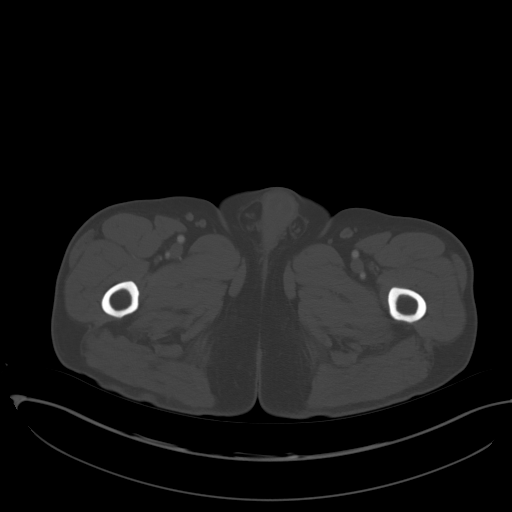
[im 16/99  soft-tissue]
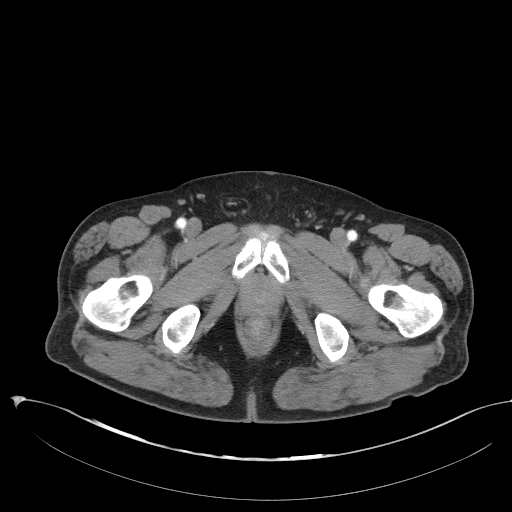
[im 21/99  soft-tissue]
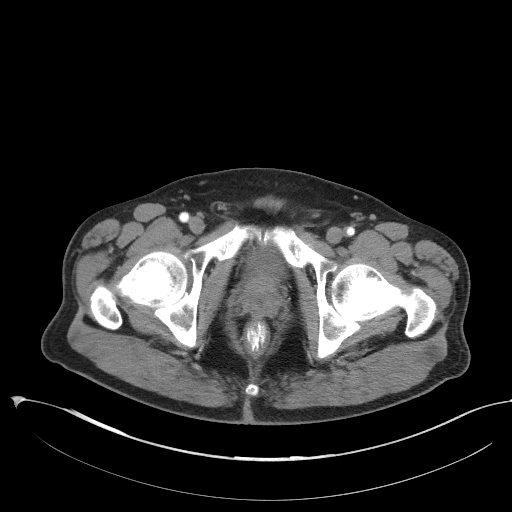
[im 31/99  soft-tissue]
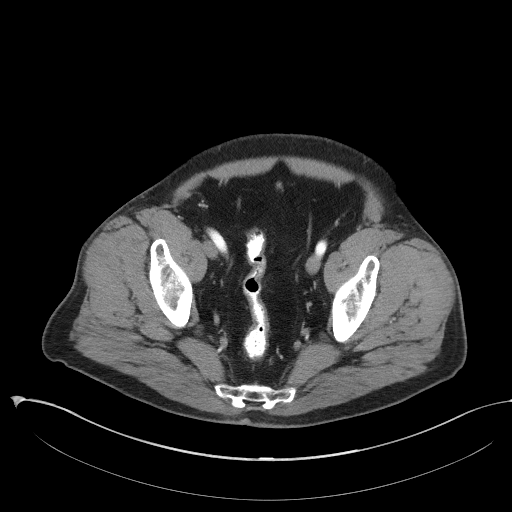
[im 37/99  soft-tissue]
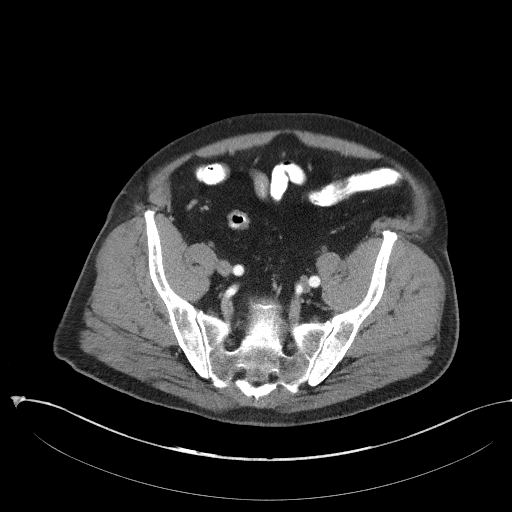
[im 47/99  soft-tissue]
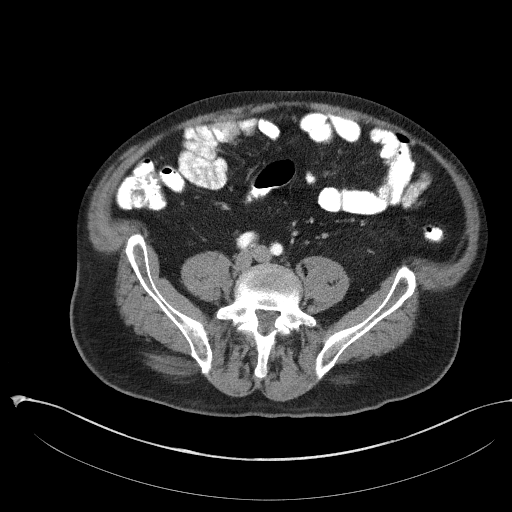
[im 52/99  soft-tissue]
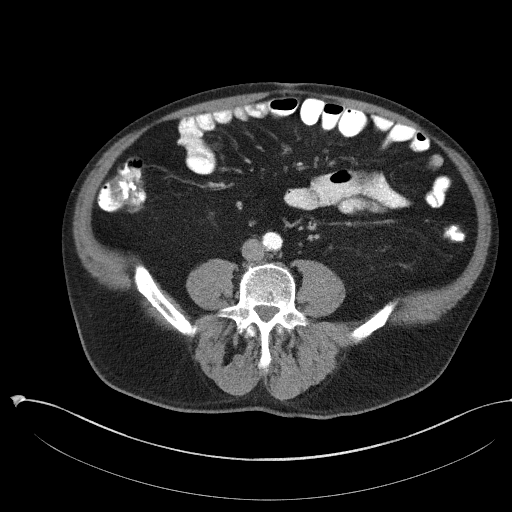
[im 62/99  soft-tissue]
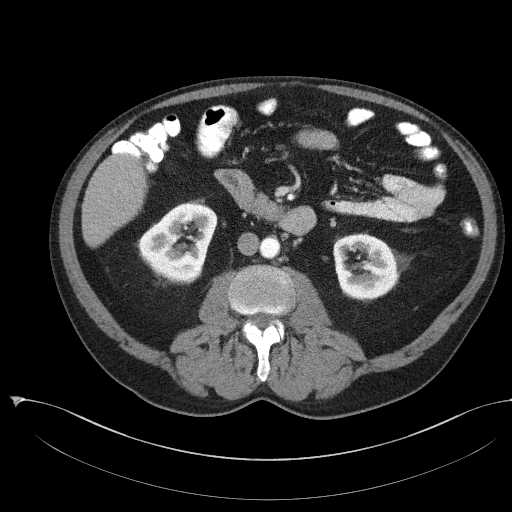
[im 68/99  soft-tissue]
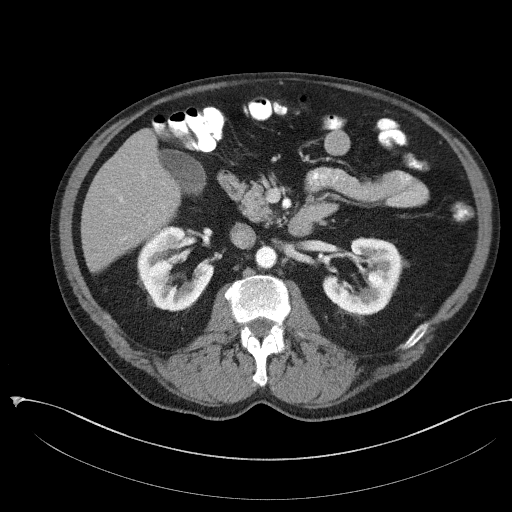
[im 68/99  bone]
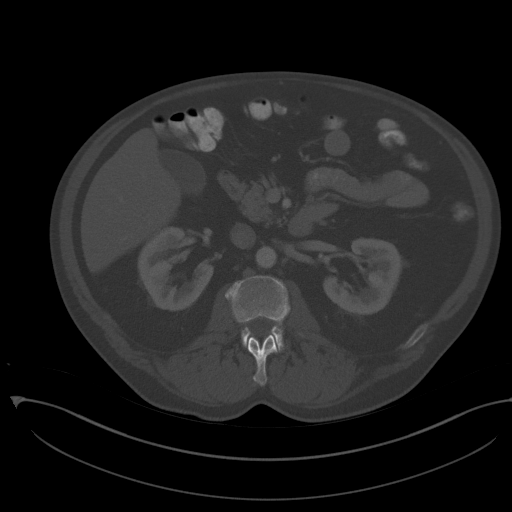
[im 78/99  soft-tissue]
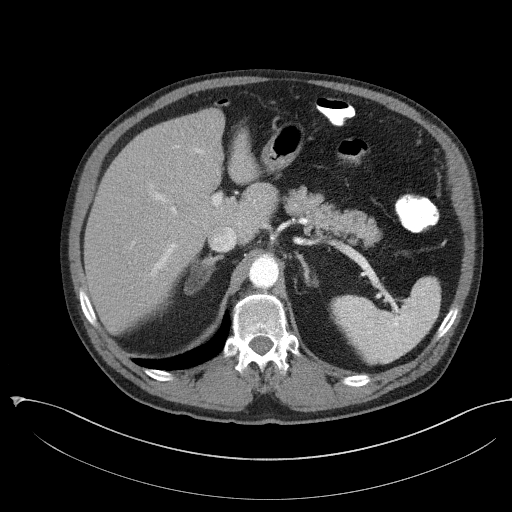
[im 83/99  soft-tissue]
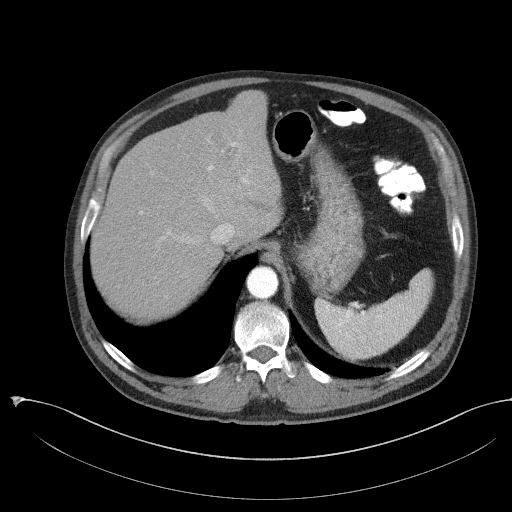
[im 93/99  soft-tissue]
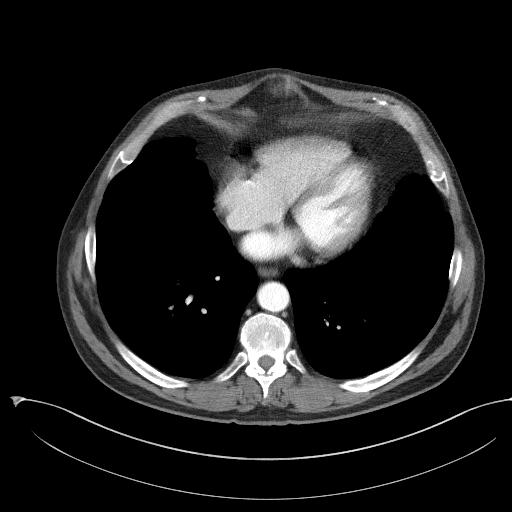

[Series 5: coronal st · coronal · 0.78mm/px · 3 of 106 slices shown]
[im 36/106  soft-tissue]
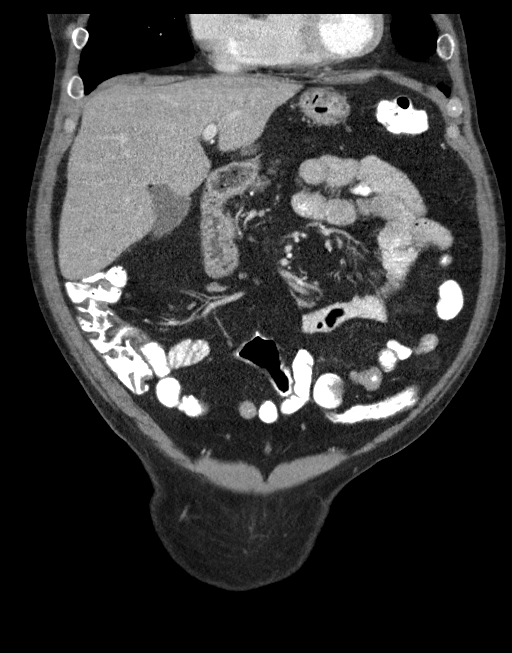
[im 47/106  soft-tissue]
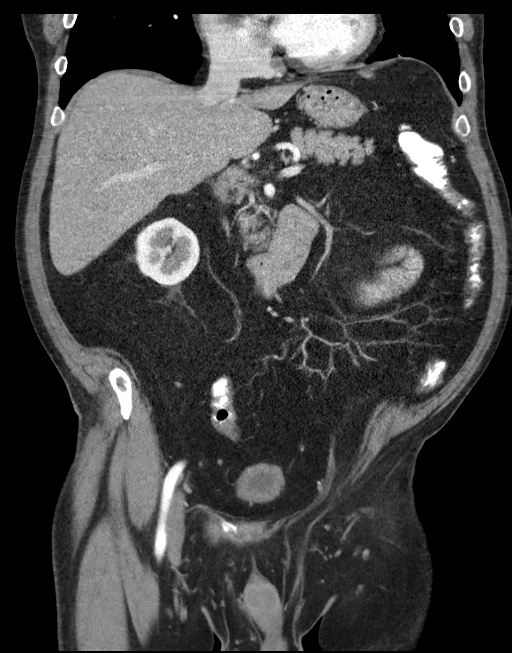
[im 59/106  soft-tissue]
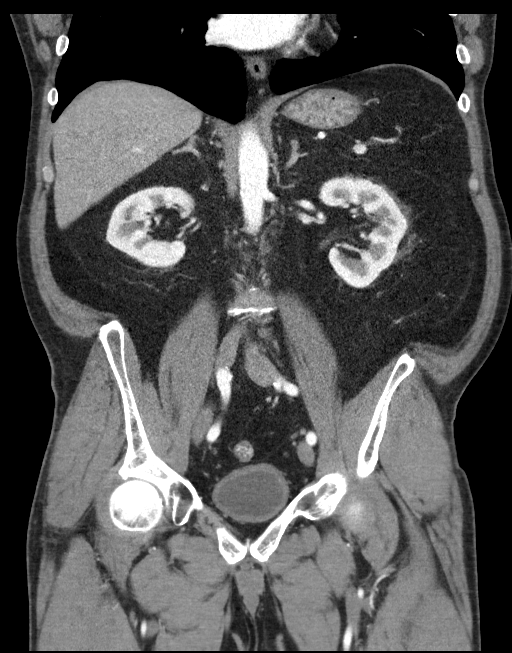

[15 of 46 positions shown; findings below may reference images not displayed]

FINDINGS: Lower chest: There is slight bibasilar lung scarring.

Hepatobiliary: There is hepatic steatosis. No focal liver lesions
are evident. Gallbladder wall is not appreciably thickened. There is
no biliary duct dilatation.

Pancreas: No pancreatic mass or inflammatory focus.

Spleen: No splenic lesions are evident.

Adrenals/Urinary Tract: There is a 2.6 x 1.6 cm right adrenal
adenoma. Left adrenal appears unremarkable. There is a 5 mm cyst in
the posterior upper pole of the left kidney. There is no
hydronephrosis on either side. There is no renal or ureteral
calculus on either side. Urinary bladder is midline with mild
generalized urinary bladder wall thickening.

Stomach/Bowel: There is no appreciable bowel wall or mesenteric
thickening. There is no bowel obstruction. No free air or portal
venous air.

Vascular/Lymphatic: There is mild atherosclerotic calcification in
the aorta and right common iliac artery. There is no demonstrable
aneurysm. Major mesenteric vessels appear patent. There is no
adenopathy in the abdomen or pelvis.

Reproductive: Prostate and seminal vesicles are normal in size and
contour. No pelvic mass evident.

Other: Appendix appears normal. No ascites or abscess evident in the
abdomen or pelvis. There is fat in each inguinal ring.

Musculoskeletal: There are pars defects at L5 bilaterally with
slight spondylolisthesis at L5-S1. There is a probable small bone
island in the right sacral ala superiorly. No lytic or destructive
bone lesions. No intramuscular or abdominal wall lesions.
IMPRESSION: Mild urinary bladder wall thickening. Question a degree of cystitis.
No renal or ureteral calculus. No hydronephrosis.

Hepatic steatosis without focal liver lesion.

Right adrenal adenoma.

No bowel wall thickening or bowel obstruction. No abscess. Appendix
appears normal.

Pars defects at L5 bilaterally.

Slight aortoiliac atherosclerosis.

## 2018-03-31 ENCOUNTER — Telehealth: Payer: Self-pay | Admitting: Internal Medicine

## 2018-04-01 NOTE — Telephone Encounter (Signed)
Sent!

## 2018-04-01 NOTE — Telephone Encounter (Signed)
Pt is requesting refill on alprazolam.   Last OV: 03/04/2018 Last Fill: 07/21/2017 #30 and 3RF UDS: 07/19/2015 Low risk  NEEDS UDS AT NEXT OV  NCCR in media 03/06/2018

## 2018-05-07 ENCOUNTER — Other Ambulatory Visit: Payer: Self-pay | Admitting: Internal Medicine

## 2018-06-03 ENCOUNTER — Other Ambulatory Visit: Payer: Self-pay | Admitting: Internal Medicine

## 2018-06-24 ENCOUNTER — Other Ambulatory Visit: Payer: Self-pay

## 2018-06-24 MED ORDER — DILANTIN 100 MG PO CAPS: ORAL_CAPSULE | ORAL | 1 refills | Status: DC

## 2018-07-01 DIAGNOSIS — M722 Plantar fascial fibromatosis: Secondary | ICD-10-CM | POA: Diagnosis not present

## 2018-07-01 DIAGNOSIS — M7732 Calcaneal spur, left foot: Secondary | ICD-10-CM | POA: Diagnosis not present

## 2018-07-01 DIAGNOSIS — M71572 Other bursitis, not elsewhere classified, left ankle and foot: Secondary | ICD-10-CM | POA: Diagnosis not present

## 2018-07-01 DIAGNOSIS — M79672 Pain in left foot: Secondary | ICD-10-CM | POA: Diagnosis not present

## 2018-07-15 DIAGNOSIS — M722 Plantar fascial fibromatosis: Secondary | ICD-10-CM | POA: Diagnosis not present

## 2018-07-15 DIAGNOSIS — M71572 Other bursitis, not elsewhere classified, left ankle and foot: Secondary | ICD-10-CM | POA: Diagnosis not present

## 2018-07-29 ENCOUNTER — Encounter: Payer: Federal, State, Local not specified - PPO | Admitting: Internal Medicine

## 2018-07-29 DIAGNOSIS — M722 Plantar fascial fibromatosis: Secondary | ICD-10-CM | POA: Diagnosis not present

## 2018-07-29 DIAGNOSIS — M71572 Other bursitis, not elsewhere classified, left ankle and foot: Secondary | ICD-10-CM | POA: Diagnosis not present

## 2018-08-12 DIAGNOSIS — M722 Plantar fascial fibromatosis: Secondary | ICD-10-CM | POA: Diagnosis not present

## 2018-08-12 DIAGNOSIS — M71572 Other bursitis, not elsewhere classified, left ankle and foot: Secondary | ICD-10-CM | POA: Diagnosis not present

## 2018-08-20 ENCOUNTER — Other Ambulatory Visit: Payer: Self-pay

## 2018-08-20 ENCOUNTER — Encounter: Payer: Self-pay | Admitting: Internal Medicine

## 2018-08-20 ENCOUNTER — Ambulatory Visit (INDEPENDENT_AMBULATORY_CARE_PROVIDER_SITE_OTHER): Payer: Federal, State, Local not specified - PPO | Admitting: Internal Medicine

## 2018-08-20 DIAGNOSIS — R55 Syncope and collapse: Secondary | ICD-10-CM | POA: Diagnosis not present

## 2018-08-20 DIAGNOSIS — E785 Hyperlipidemia, unspecified: Secondary | ICD-10-CM

## 2018-08-20 DIAGNOSIS — R739 Hyperglycemia, unspecified: Secondary | ICD-10-CM | POA: Diagnosis not present

## 2018-08-20 NOTE — Progress Notes (Signed)
Subjective:    Patient ID: Kenneth [name]., male    DOB: [birth year], 63 y.o.   MRN: [id number]  DOS:  08/20/2018 Type of visit - description: Virtual Visit via Video Note  I connected with@ on 08/21/18 at 10:40 AM EDT by a video enabled telemedicine application and verified that I am speaking with the correct person using two identifiers.   THIS ENCOUNTER IS A VIRTUAL VISIT DUE TO COVID-19 - PATIENT WAS NOT SEEN IN THE OFFICE. PATIENT HAS CONSENTED TO VIRTUAL VISIT / TELEMEDICINE VISIT   Location of patient: home  Location of provider: office  I discussed the limitations of evaluation and management by telemedicine and the availability of in person appointments. The patient expressed understanding and agreed to proceed.  History of Present Illness: ROV In general feeling well. We review his medications and labs. Taking good precautions regards COVID-19. He is having trouble with left-sided plantar fasciitis for the last 2 months, seeing a podiatrist, is getting better, was prescribed ibuprofen 800 mg, fortunately he is taking only sparingly he follows all the GI precautions.  Review of Systems  No fever chills No chest pain no difficulty breathing No cough.  Past Medical History:  Diagnosis Date  . Allergy   . Hx of colonic polyp 2007  . Hyperlipidemia   . Insomnia 06/02/2014  . Syncope    in 20s; no etiology, no recurrence. Dilantin Rxed    Past Surgical History:  Procedure Laterality Date  . colonoscopy with polypectomy  2007   Dr Carlean Purl, 4 mm rectal polyp  . DENTAL SURGERY     bone grafts, to get implants  . NASAL FRACTURE SURGERY    . TONSILLECTOMY AND ADENOIDECTOMY      Social History   Socioeconomic History  . Marital status: Married    Spouse name: Not on file  . Number of children: 0  . Years of education: Not on file  . Highest education level: Not on file  Occupational History  . 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 Financial resource strain: Not on file  . Food insecurity:    Worry: Not on file    Inability: Not on file  . Transportation needs:    Medical: Not on file    Non-medical: Not on file  Tobacco Use  . Smoking status: Never Smoker  . Smokeless tobacco: Never Used  Substance and Sexual Activity  . Alcohol use: Yes    Alcohol/week: 6.0 - 8.0 standard drinks    Types: 6 - 8 Cans of beer per week  . Drug use: No  . Sexual activity: Not on file  Lifestyle  . Physical activity:    Days per week: Not on file    Minutes per session: Not on file  . Stress: Not on file  Relationships  . Social connections:    Talks on phone: Not on file    Gets together: Not on file    Attends religious service: Not on file    Active member of club or organization: Not on file    Attends meetings of clubs or organizations: Not on file    Relationship status: Not on file  . Intimate partner violence:    Fear of current or ex partner: Not on file    Emotionally abused: Not on file    Physically abused: Not on file    Forced sexual activity: Not on file  Other  Topics Concern  . Not on file  Social History Narrative   Household pt and wife      Allergies as of 08/20/2018   No Known Allergies     Medication List       Accurate as of August 20, 2018 11:59 PM. Always use your most recent med list.        ALPRAZolam 0.5 MG tablet Commonly known as:  XANAX TAKE 1 TABLET(0.5 MG) BY MOUTH AT BEDTIME AS NEEDED FOR ANXIETY   aspirin 81 MG tablet Take 81 mg by mouth daily.   calcium carbonate 600 MG Tabs tablet Commonly known as:  OS-CAL Take 1,200 mg by mouth daily.   Dilantin 100 MG ER capsule Generic drug:  phenytoin TAKE 5 CAPSULES DAILY AS   INSTRUCTED   fexofenadine 180 MG tablet Commonly known as:  ALLEGRA Take 180 mg by mouth daily.   Fish Oil 300 MG Caps Take by mouth. (Mega Red)   fluocinonide gel 0.05 % Commonly known as:  LIDEX  Apply 1 application topically 3 (three) times daily as needed.   naproxen 500 MG tablet Commonly known as:  NAPROSYN Take 1 tablet (500 mg total) by mouth daily as needed for moderate pain.   rosuvastatin 10 MG tablet Commonly known as:  CRESTOR Take 1 tablet (10 mg total) by mouth daily.   Triple Flex 500-400-125 MG Tabs Generic drug:  Glucosamine-Chondroitin-MSM Take 1 tablet by mouth daily.   TURMERIC PO Take by mouth.   vitamin B-12 500 MCG tablet Commonly known as:  CYANOCOBALAMIN Take 1,000 mcg by mouth daily.   vitamin C 1000 MG tablet Take 1,000 mg by mouth daily.   Vitamin D3 50 MCG (2000 UT) Tabs Take 1,000 Units by mouth daily.           Objective:   Physical Exam There were no vitals taken for this visit. This is virtual video visit.  Alert oriented x3, no apparent distress    Assessment     Assessment Hyperlipidemia Insomnia-- xanax prn, rx per pcp Allergies Syncope, in the 1980s, on Dilantin since then, used to see Dr. love, reluctant to stop dilantin Elevated LFTs: on-off x years, hepatitis B C -2008, iron, ferritin, alpha-1 antitrypsin normal 2016. CT 4-/2018: Hepatic steatosis.  SPEP/2019- Incomplete RBBB-previously a stress test was negative Osteopenia?   DEXA 07/18/2016 normal  PLAN: High cholesterol: On Crestor, due for labs Insomnia: Well-controlled on Xanax Hyperglycemia: Last A1c 5.4, normal. Syncope, on Dilantin, good compliance, due for labs COVID-19: Praised for f/u all the precautions In general the patient is doing well, ideally I would do some blood work but we agreed to come back in 2 or 3 months for a physical exam fasting , blood work will be done at the time. We will call and schedule.  [name]-to-[name] 12 minutes    I discussed the assessment and treatment plan with the patient. The patient was provided an opportunity to ask questions and all were answered. The patient agreed with the plan and demonstrated an understanding of the  instructions.   The patient was advised to call back or seek an in-person evaluation if the symptoms worsen or if the condition fails to improve as anticipated.

## 2018-08-21 DIAGNOSIS — R739 Hyperglycemia, unspecified: Secondary | ICD-10-CM | POA: Insufficient documentation

## 2018-08-21 NOTE — Assessment & Plan Note (Signed)
High cholesterol: On Crestor, due for labs Insomnia: Well-controlled on Xanax Hyperglycemia: Last A1c 5.4, normal. Syncope, on Dilantin, good compliance, due for labs COVID-19: Praised for f/u all the precautions In general the patient is doing well, ideally I would do some blood work but we agreed to come back in 2 or 3 months for a physical exam fasting , blood work will be done at the time. We will call and schedule.

## 2018-08-21 NOTE — Addendum Note (Signed)
Addended by: Kathlene November E on: 08/21/2018 12:42 PM   Modules accepted: Level of Service

## 2018-08-25 DIAGNOSIS — M25572 Pain in left ankle and joints of left foot: Secondary | ICD-10-CM | POA: Diagnosis not present

## 2018-09-22 DIAGNOSIS — H40013 Open angle with borderline findings, low risk, bilateral: Secondary | ICD-10-CM | POA: Diagnosis not present

## 2018-10-29 ENCOUNTER — Other Ambulatory Visit: Payer: Self-pay | Admitting: Internal Medicine

## 2018-11-18 ENCOUNTER — Other Ambulatory Visit: Payer: Self-pay

## 2018-11-19 ENCOUNTER — Ambulatory Visit (INDEPENDENT_AMBULATORY_CARE_PROVIDER_SITE_OTHER): Payer: Federal, State, Local not specified - PPO | Admitting: Internal Medicine

## 2018-11-19 ENCOUNTER — Encounter: Payer: Self-pay | Admitting: Internal Medicine

## 2018-11-19 VITALS — BP 138/73 | HR 54 | Temp 98.2°F | Resp 16 | Ht 68.0 in | Wt 196.2 lb

## 2018-11-19 DIAGNOSIS — R739 Hyperglycemia, unspecified: Secondary | ICD-10-CM | POA: Diagnosis not present

## 2018-11-19 DIAGNOSIS — E785 Hyperlipidemia, unspecified: Secondary | ICD-10-CM

## 2018-11-19 DIAGNOSIS — Z23 Encounter for immunization: Secondary | ICD-10-CM | POA: Diagnosis not present

## 2018-11-19 DIAGNOSIS — R7989 Other specified abnormal findings of blood chemistry: Secondary | ICD-10-CM

## 2018-11-19 DIAGNOSIS — Z0000 Encounter for general adult medical examination without abnormal findings: Secondary | ICD-10-CM | POA: Diagnosis not present

## 2018-11-19 DIAGNOSIS — Z125 Encounter for screening for malignant neoplasm of prostate: Secondary | ICD-10-CM | POA: Diagnosis not present

## 2018-11-19 DIAGNOSIS — R55 Syncope and collapse: Secondary | ICD-10-CM

## 2018-11-19 LAB — COMPREHENSIVE METABOLIC PANEL
ALT: 76 U/L — ABNORMAL HIGH (ref 0–53)
AST: 48 U/L — ABNORMAL HIGH (ref 0–37)
Albumin: 4.5 g/dL (ref 3.5–5.2)
Alkaline Phosphatase: 67 U/L (ref 39–117)
BUN: 18 mg/dL (ref 6–23)
CO2: 28 mEq/L (ref 19–32)
Calcium: 9.6 mg/dL (ref 8.4–10.5)
Chloride: 105 mEq/L (ref 96–112)
Creatinine, Ser: 0.86 mg/dL (ref 0.40–1.50)
GFR: 89.22 mL/min (ref >60.00)
Glucose, Bld: 117 mg/dL — ABNORMAL HIGH (ref 70–99)
Potassium: 4.4 mEq/L (ref 3.5–5.1)
Sodium: 140 mEq/L (ref 135–145)
Total Bilirubin: 0.5 mg/dL (ref 0.2–1.2)
Total Protein: 6.6 g/dL (ref 6.0–8.3)

## 2018-11-19 LAB — PSA: PSA: 1.21 ng/mL (ref 0.10–4.00)

## 2018-11-19 LAB — LIPID PANEL
Cholesterol: 211 mg/dL — ABNORMAL HIGH (ref 0–200)
HDL: 68 mg/dL (ref >39.00)
LDL Cholesterol: 115 mg/dL — ABNORMAL HIGH (ref 0–99)
NonHDL: 142.78
Total CHOL/HDL Ratio: 3
Triglycerides: 141 mg/dL (ref 0.0–149.0)
VLDL: 28.2 mg/dL (ref 0.0–40.0)

## 2018-11-19 LAB — B12 AND FOLATE PANEL
Folate: 22.6 ng/mL (ref >5.9)
Vitamin B-12: 586 pg/mL (ref 211–911)

## 2018-11-19 NOTE — Progress Notes (Signed)
Subjective:    Patient ID: Kenneth [name]., male    DOB: 31-May-1955, 63 y.o.   MRN: [id number]  DOS:  11/19/2018 Type of visit - description: CPX In general feeling well. 4 weeks ago developed pain at the right trapezoid area.  No radiation, no upper or lower extremity paresthesias, no neck pain per se.  Problem started after he pulled a heavy gate. Not taking NSAIDs frequently maybe 3 or 4 times a week.  Review of Systems  Other than above, a 14 point review of systems is negative    Past Medical History:  Diagnosis Date  . Allergy   . Hx of colonic polyp 2007  . Hyperlipidemia   . Insomnia 06/02/2014  . Syncope    in 20s; no etiology, no recurrence. Dilantin Rxed    Past Surgical History:  Procedure Laterality Date  . colonoscopy with polypectomy  2007   Dr Carlean Purl, 4 mm rectal polyp  . DENTAL SURGERY     bone grafts, to get implants  . NASAL FRACTURE SURGERY    . TONSILLECTOMY AND ADENOIDECTOMY      Social History   Socioeconomic History  . Marital status: Married    Spouse name: Not on file  . Number of children: 0  . Years of education: Not on file  . Highest education level: Not on file  Occupational History  . 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 Financial resource strain: Not on file  . Food insecurity    Worry: Not on file    Inability: Not on file  . Transportation needs    Medical: Not on file    Non-medical: Not on file  Tobacco Use  . Smoking status: Never Smoker  . Smokeless tobacco: Never Used  Substance and Sexual Activity  . Alcohol use: Yes    Alcohol/week: 6.0 - 8.0 standard drinks    Types: 6 - 8 Cans of beer per week    Comment: 1-2 beers qd   . Drug use: No  . Sexual activity: Not on file  Lifestyle  . Physical activity    Days per week: Not on file    Minutes per session: Not on file  . Stress: Not on file  Relationships  . Social Herbalist  on phone: Not on file    Gets together: Not on file    Attends religious service: Not on file    Active member of club or organization: Not on file    Attends meetings of clubs or organizations: Not on file    Relationship status: Not on file  . Intimate partner violence    Fear of current or ex partner: Not on file    Emotionally abused: Not on file    Physically abused: Not on file    Forced sexual activity: Not on file  Other Topics Concern  . Not on file  Social History Narrative   Household pt and wife     Family History  Problem Relation Age of Onset  . Macular degeneration Father   . Cancer Mother        throat; smoker  . Macular degeneration Paternal Uncle   . Rectal cancer Other        maternal cousin  . Colon cancer Neg Hx   . Esophageal cancer Neg Hx   . Stomach cancer Neg Hx   . Stroke Neg Hx   .  Heart disease Neg Hx   . Diabetes Neg Hx   . Prostate cancer Neg Hx      Allergies as of 11/19/2018   No Known Allergies     Medication List       Accurate as of November 19, 2018 11:59 PM. If you have any questions, ask your nurse or doctor.        STOP taking these medications   Triple Flex 500-400-125 MG Tabs Generic drug: Glucosamine-Chondroitin-MSM Stopped by: Kathlene November, MD     TAKE these medications   ALPRAZolam 0.5 MG tablet Commonly known as: XANAX TAKE 1 TABLET(0.5 MG) BY MOUTH AT BEDTIME AS NEEDED FOR ANXIETY   aspirin 81 MG tablet Take 81 mg by mouth daily.   calcium carbonate 600 MG Tabs tablet Commonly known as: OS-CAL Take 1,200 mg by mouth daily.   Dilantin 100 MG ER capsule Generic drug: phenytoin TAKE 5 CAPSULES DAILY AS   INSTRUCTED   fexofenadine 180 MG tablet Commonly known as: ALLEGRA Take 180 mg by mouth daily.   Fish Oil 300 MG Caps Take by mouth. (Mega Red)   fluocinonide gel 0.05 % Commonly known as: LIDEX Apply 1 application topically 3 (three) times daily as needed.   naproxen 500 MG tablet Commonly known as:  NAPROSYN Take 1 tablet (500 mg total) by mouth daily as needed for moderate pain.   rosuvastatin 10 MG tablet Commonly known as: CRESTOR Take 1 tablet (10 mg total) by mouth daily.   TURMERIC PO Take by mouth.   vitamin B-12 500 MCG tablet Commonly known as: CYANOCOBALAMIN Take 1,000 mcg by mouth daily.   vitamin C 1000 MG tablet Take 1,000 mg by mouth daily.   Vitamin D3 50 MCG (2000 UT) Tabs Take 1,000 Units by mouth daily.           Objective:   Physical Exam BP 138/73 (BP Location: Left Arm, Patient Position: Sitting, Cuff Size: Small)   Pulse (!) 54   Temp 98.2 F (36.8 C) (Oral)   Resp 16   Ht 5\' 8"  (1.727 m)   Wt 196 lb 4 oz (89 kg)   SpO2 95%   BMI 29.84 kg/m  General: Well developed, NAD, BMI noted Neck: No  thyromegaly  HEENT:  Normocephalic . [name] symmetric, atraumatic Lungs:  CTA B Normal respiratory effort, no intercostal retractions, no accessory muscle use. Heart: RRR,  no murmur.  No pretibial edema bilaterally  Abdomen:  Not distended, soft, non-tender. No rebound or rigidity.   Skin: Exposed areas without rash. Not pale. Not jaundice Rectal: External abnormalities: none. Normal sphincter tone. No rectal masses or tenderness.  Brown stools Prostate: Prostate gland firm and smooth, no enlargement, nodularity, tenderness, mass, asymmetry or induration MSK: No TTP at the trapezoid area, neck full range of motion.   Neurologic:  alert & oriented X3.  Speech normal, gait appropriate for age and unassisted Strength symmetric and appropriate for age.  Psych: Cognition and judgment appear intact.  Cooperative with normal attention span and concentration.  Behavior appropriate. No anxious or depressed appearing.     Assessment    Assessment Hyperlipidemia Insomnia-- xanax prn, rx per pcp Allergies Syncope, in the 1980s, on Dilantin since then, used to see Dr. Erling Cruz, reluctant to stop dilantin Elevated LFTs: on-off x years, hepatitis B  C -2008, iron, ferritin, alpha-1 antitrypsin normal 2016. CT 4-/2018: Hepatic steatosis.  SPEP/2019- Incomplete RBBB-previously a stress test was negative Osteopenia?   DEXA 07/18/2016 normal  PLAN:  Hyperlipidemia: Continue Crestor, check FLP Insomnia: On Xanax as needed Elevated LFTs: Monitoring today.  Reminded patient more than 2 drinks a day is deleterious to his health Trapezoid pain: Declined any meds for now, recommend self PT.  See instructions History of syncope: Check Dilantin levels RTC 6 months

## 2018-11-19 NOTE — Assessment & Plan Note (Addendum)
-  Td  06-2016 - prevnar:  11/19/2018 - shingrex #1 at his pharmacy (~12/2017) , #2 pending, recommend to proceed when available - rec flu shot this fall -CC: Last colonoscopy 2013, neg ---- next 2023 Prostate cancer screening: DRE today normal, check a PSA.   -labs: CMP, FLP, PSA, Dilantin level, K06 and folic acid -Diet and exercise discussed

## 2018-11-19 NOTE — Patient Instructions (Signed)
GO TO THE LAB : Get the blood work     GO TO THE FRONT DESK Schedule your next appointment   for a checkup in 6 months  Proceed with your Shingrix No. 2 when available  For pain at the upper back: Try self physical therapy Go today [facility] website they have very good information and videos  Call if not better

## 2018-11-19 NOTE — Progress Notes (Signed)
Pre visit review using our clinic review tool, if applicable. No additional management support is needed unless otherwise documented below in the visit note. 

## 2018-11-20 LAB — PHENYTOIN LEVEL, TOTAL: Phenytoin, Total: 18.4 mg/L (ref 10.0–20.0)

## 2018-11-20 NOTE — Assessment & Plan Note (Addendum)
Hyperlipidemia: Continue Crestor, check FLP Insomnia: On Xanax as needed Elevated LFTs: Monitoring today.  Reminded patient more than 2 drinks a day is deleterious to his health Trapezoid pain: Declined any meds for now, recommend self PT.  See instructions History of syncope: Check Dilantin levels RTC 6 months

## 2018-12-16 ENCOUNTER — Telehealth: Payer: Self-pay | Admitting: Internal Medicine

## 2018-12-16 NOTE — Telephone Encounter (Signed)
Alprazolam refill.   Last OV: 11/19/2018 Last Fill: 04/01/2018 #30 and 3RF UDS: 07/21/2017 Low risk

## 2018-12-17 NOTE — Telephone Encounter (Signed)
Sent!

## 2018-12-31 ENCOUNTER — Other Ambulatory Visit: Payer: Self-pay

## 2018-12-31 DIAGNOSIS — Z20822 Contact with and (suspected) exposure to covid-19: Secondary | ICD-10-CM

## 2019-01-01 LAB — NOVEL CORONAVIRUS, NAA: SARS-CoV-2, NAA: NOT DETECTED

## 2019-01-26 ENCOUNTER — Encounter: Payer: Self-pay | Admitting: Internal Medicine

## 2019-01-26 DIAGNOSIS — H40013 Open angle with borderline findings, low risk, bilateral: Secondary | ICD-10-CM | POA: Diagnosis not present

## 2019-02-02 ENCOUNTER — Ambulatory Visit: Payer: Medicare Other

## 2019-02-09 DIAGNOSIS — M71572 Other bursitis, not elsewhere classified, left ankle and foot: Secondary | ICD-10-CM | POA: Diagnosis not present

## 2019-02-09 DIAGNOSIS — M722 Plantar fascial fibromatosis: Secondary | ICD-10-CM | POA: Diagnosis not present

## 2019-02-10 ENCOUNTER — Telehealth: Payer: Self-pay

## 2019-02-10 MED ORDER — ALPRAZOLAM 0.25 MG PO TABS: 0.5000 mg | ORAL_TABLET | Freq: Every evening | ORAL | 1 refills | Status: DC | PRN

## 2019-02-10 NOTE — Telephone Encounter (Signed)
Received fax from [facility]- alprazolam 0.5mg  on back order through November- requesting to change to 0.25mg  dose or 1mg  dose. Please advise.

## 2019-02-10 NOTE — Telephone Encounter (Signed)
Will change to alprazolam 0.25 mg, 2 tablets at night.  Prescription sent.

## 2019-04-02 DIAGNOSIS — D485 Neoplasm of uncertain behavior of skin: Secondary | ICD-10-CM | POA: Diagnosis not present

## 2019-04-02 DIAGNOSIS — L821 Other seborrheic keratosis: Secondary | ICD-10-CM | POA: Diagnosis not present

## 2019-04-02 DIAGNOSIS — L918 Other hypertrophic disorders of the skin: Secondary | ICD-10-CM | POA: Diagnosis not present

## 2019-04-02 DIAGNOSIS — L820 Inflamed seborrheic keratosis: Secondary | ICD-10-CM | POA: Diagnosis not present

## 2019-05-06 ENCOUNTER — Other Ambulatory Visit: Payer: Self-pay | Admitting: Internal Medicine

## 2019-05-25 ENCOUNTER — Other Ambulatory Visit: Payer: Self-pay

## 2019-05-26 ENCOUNTER — Ambulatory Visit (INDEPENDENT_AMBULATORY_CARE_PROVIDER_SITE_OTHER): Payer: Medicare Other | Admitting: Internal Medicine

## 2019-05-26 ENCOUNTER — Encounter: Payer: Self-pay | Admitting: Internal Medicine

## 2019-05-26 ENCOUNTER — Other Ambulatory Visit: Payer: Self-pay

## 2019-05-26 VITALS — BP 153/66 | HR 53 | Temp 96.3°F | Resp 16 | Ht 68.0 in | Wt 202.4 lb

## 2019-05-26 DIAGNOSIS — R7989 Other specified abnormal findings of blood chemistry: Secondary | ICD-10-CM

## 2019-05-26 DIAGNOSIS — E785 Hyperlipidemia, unspecified: Secondary | ICD-10-CM

## 2019-05-26 DIAGNOSIS — F439 Reaction to severe stress, unspecified: Secondary | ICD-10-CM

## 2019-05-26 DIAGNOSIS — R739 Hyperglycemia, unspecified: Secondary | ICD-10-CM

## 2019-05-26 LAB — AST: AST: 55 U/L — ABNORMAL HIGH (ref 0–37)

## 2019-05-26 LAB — LIPID PANEL
Cholesterol: 211 mg/dL — ABNORMAL HIGH (ref 0–200)
HDL: 72.8 mg/dL (ref >39.00)
LDL Cholesterol: 111 mg/dL — ABNORMAL HIGH (ref 0–99)
NonHDL: 138.35
Total CHOL/HDL Ratio: 3
Triglycerides: 136 mg/dL (ref 0.0–149.0)
VLDL: 27.2 mg/dL (ref 0.0–40.0)

## 2019-05-26 LAB — HEMOGLOBIN A1C: Hgb A1c MFr Bld: 5.4 % (ref 4.6–6.5)

## 2019-05-26 LAB — ALT: ALT: 96 U/L — ABNORMAL HIGH (ref 0–53)

## 2019-05-26 NOTE — Progress Notes (Signed)
Pre visit review using our clinic review tool, if applicable. No additional management support is needed unless otherwise documented below in the visit note. 

## 2019-05-26 NOTE — Progress Notes (Signed)
   Subjective:    Patient ID: Kenneth Face., male    DOB: 20-Aug-1955, 64 y.o.   MRN: [id number]  DOS:  05/26/2019 Type of visit - description: Routine office visit We discussed his chronic medical problems. Good compliance with medication. He is a slightly less active than before in part because he had plantar fasciitis.   Review of Systems Denies chest pain or difficulty breathing.  No lower extremity edema Reports some stress and occasional insomnia  Past Medical History:  Diagnosis Date  . Allergy   . Hx of colonic polyp 2007  . Hyperlipidemia   . Insomnia 06/02/2014  . Syncope    in 20s; no etiology, no recurrence. Dilantin Rxed    Past Surgical History:  Procedure Laterality Date  . colonoscopy with polypectomy  2007   Dr Carlean Purl, 4 mm rectal polyp  . DENTAL SURGERY     bone grafts, to get implants  . NASAL FRACTURE SURGERY    . TONSILLECTOMY AND ADENOIDECTOMY          Objective:   Physical Exam BP (!) 153/66 (BP Location: Left Arm, Patient Position: Sitting, Cuff Size: Normal)   Pulse (!) 53   Temp (!) 96.3 F (35.7 C) (Temporal)   Resp 16   Ht 5\' 8"  (1.727 m)   Wt 202 lb 6 oz (91.8 kg)   SpO2 97%   BMI 30.77 kg/m  General:   Well developed, NAD, BMI noted. HEENT:  Normocephalic . Face symmetric, atraumatic Lungs:  CTA B Normal respiratory effort, no intercostal retractions, no accessory muscle use. Heart: RRR,  no murmur.  No pretibial edema bilaterally  Skin: Not pale. Not jaundice Neurologic:  alert & oriented X3.  Speech normal, gait appropriate for age and unassisted Psych--  Cognition and judgment appear intact.  Cooperative with normal attention span and concentration.  Behavior appropriate. No anxious or depressed appearing.      Assessment      Assessment Hyperlipidemia Insomnia-- xanax prn, rx per pcp Allergies Syncope, in the 1980s, on Dilantin since then, used to see Dr. Erling Cruz, reluctant to stop dilantin Elevated LFTs:  on-off x years, hepatitis B C -2008, iron, ferritin, alpha-1 antitrypsin normal 2016. CT 4-/2018: Hepatic steatosis.  SPEP/2019- Incomplete RBBB-previously a stress test was negative Osteopenia?   DEXA 07/18/2016 normal  PLAN: Hyperlipidemia: On Crestor, last LDL slightly elevated, we agreed to recheck a FLP Insomnia: On Xanax, still has occasional issues, pointers for healthy sleep provided including the use of melatonin nightly. Stress: Listening therapy provided.  Knows to call if symptoms increase. Increase LFTs: Checking labs Mild hyperglycemia noted, check A1c RTC 7-20 21 CPX   This visit occurred during the SARS-CoV-2 public health emergency.  Safety protocols were in place, including screening questions prior to the visit, additional usage of staff PPE, and extensive cleaning of exam room while observing appropriate contact time as indicated for disinfecting solutions.

## 2019-05-26 NOTE — Patient Instructions (Signed)
GO TO THE LAB : Get the blood work     GO TO THE FRONT DESK Come back for a physical exam by 10-2019, please make an appointment

## 2019-05-27 NOTE — Assessment & Plan Note (Signed)
Hyperlipidemia: On Crestor, last LDL slightly elevated, we agreed to recheck a FLP Insomnia: On Xanax, still has occasional issues, pointers for healthy sleep provided including the use of melatonin nightly. Stress: Listening therapy provided.  Knows to call if symptoms increase. Increase LFTs: Checking labs Mild hyperglycemia noted, check A1c RTC 7-20 21 CPX

## 2019-05-28 MED ORDER — EZETIMIBE 10 MG PO TABS: 10.0000 mg | ORAL_TABLET | Freq: Every day | ORAL | 3 refills | Status: DC

## 2019-05-28 NOTE — Addendum Note (Signed)
Addended byDamita Dunnings D on: 05/28/2019 12:54 PM   Modules accepted: Orders

## 2019-06-28 ENCOUNTER — Telehealth: Payer: Self-pay | Admitting: Internal Medicine

## 2019-06-29 NOTE — Telephone Encounter (Signed)
Sent, PDMP okay 

## 2019-06-29 NOTE — Telephone Encounter (Signed)
Alprazolam refill.   Last OV: 05/26/2019 Last Fill: 02/10/2019 #60 and 1RF UDS: 05/26/2019 No UDS per PCP

## 2019-07-27 DIAGNOSIS — H40013 Open angle with borderline findings, low risk, bilateral: Secondary | ICD-10-CM | POA: Diagnosis not present

## 2019-08-05 DIAGNOSIS — M89372 Hypertrophy of bone, left ankle and foot: Secondary | ICD-10-CM | POA: Diagnosis not present

## 2019-08-05 DIAGNOSIS — M25572 Pain in left ankle and joints of left foot: Secondary | ICD-10-CM | POA: Diagnosis not present

## 2019-08-05 DIAGNOSIS — M65872 Other synovitis and tenosynovitis, left ankle and foot: Secondary | ICD-10-CM | POA: Diagnosis not present

## 2019-09-06 ENCOUNTER — Other Ambulatory Visit: Payer: Self-pay | Admitting: Internal Medicine

## 2019-11-01 ENCOUNTER — Other Ambulatory Visit: Payer: Self-pay | Admitting: Internal Medicine

## 2019-11-30 ENCOUNTER — Other Ambulatory Visit: Payer: Self-pay

## 2019-11-30 ENCOUNTER — Other Ambulatory Visit: Payer: Federal, State, Local not specified - PPO

## 2019-11-30 DIAGNOSIS — Z20822 Contact with and (suspected) exposure to covid-19: Secondary | ICD-10-CM

## 2019-12-03 ENCOUNTER — Telehealth (INDEPENDENT_AMBULATORY_CARE_PROVIDER_SITE_OTHER): Payer: Medicare Other | Admitting: Internal Medicine

## 2019-12-03 ENCOUNTER — Other Ambulatory Visit: Payer: Self-pay

## 2019-12-03 ENCOUNTER — Telehealth: Payer: Self-pay | Admitting: Internal Medicine

## 2019-12-03 ENCOUNTER — Encounter: Payer: Self-pay | Admitting: Internal Medicine

## 2019-12-03 VITALS — BP 166/85

## 2019-12-03 DIAGNOSIS — R509 Fever, unspecified: Secondary | ICD-10-CM | POA: Diagnosis not present

## 2019-12-03 LAB — NOVEL CORONAVIRUS, NAA

## 2019-12-03 NOTE — Telephone Encounter (Signed)
Pt.notified

## 2019-12-03 NOTE — Telephone Encounter (Signed)
Caller: Enrico Call back # [phone]  Patient calling back to report blood pressure reading 1st attempt 166/85, 2nd attempt 160/87 both using left arm while seated.

## 2019-12-03 NOTE — Telephone Encounter (Signed)
For now recommend to continue monitoring. Call Monday with readings. If BP continues to be modestly elevated consider starting medication. If it ever increases more than 190, needs to call the office immediately.  BP Readings from Last 3 Encounters:  12/03/19 (!) 166/85  05/26/19 (!) 153/66  11/19/18 138/73

## 2019-12-03 NOTE — Telephone Encounter (Signed)
BP updated into patient's chart for visit today

## 2019-12-03 NOTE — Telephone Encounter (Signed)
Patient called back wondering what is the next step since his blood pressure readings were high.

## 2019-12-03 NOTE — Progress Notes (Signed)
Subjective:    Patient ID: Kenneth [name]., male    DOB: Sep 21, 1955, 64 y.o.   MRN: [id number]  DOS:  12/03/2019 Type of visit - description: Virtual Visit via Video Note  I connected with the above patient  by a video enabled telemedicine application and verified that I am speaking with the correct person using two identifiers.   THIS ENCOUNTER IS A VIRTUAL VISIT DUE TO COVID-19 - PATIENT WAS NOT SEEN IN THE OFFICE. PATIENT HAS CONSENTED TO VIRTUAL VISIT / TELEMEDICINE VISIT   Location of patient: home  Location of provider: office  Persons participating in the virtual visit: patient, provider   I discussed the limitations of evaluation and management by telemedicine and the availability of in person appointments. The patient expressed understanding and agreed to proceed.     Acute 4 weeks history of low-grade fever. Initially he was able to check his temperature and T-max was 100. Lately he only feels feverish but his temperature is normal. He also developed some cough, he said it was more like he was trying to clear her throat than cough itself. The only other symptom that he noted is some neck pain described as tightness or stiffness.    Review of Systems Denies weight loss or actual headache No urinary symptoms No nausea, vomiting, diarrhea No night sweats No chest pain no difficulty breathing No unusual myalgias. No lower extremity edema, calf pain.  No recent airplane trip or prolonged car trip. No rash  Past Medical History:  Diagnosis Date  . Allergy   . Hx of colonic polyp 2007  . Hyperlipidemia   . Insomnia 06/02/2014  . Syncope    in 20s; no etiology, no recurrence. Dilantin Rxed    Past Surgical History:  Procedure Laterality Date  . colonoscopy with polypectomy  2007   Dr Carlean Purl, 4 mm rectal polyp  . DENTAL SURGERY     bone grafts, to get implants  . NASAL FRACTURE SURGERY    . TONSILLECTOMY AND ADENOIDECTOMY      Allergies as of  12/03/2019   No Known Allergies     Medication List       Accurate as of December 03, 2019 11:25 AM. If you have any questions, ask your nurse or doctor.        ALPRAZolam 0.25 MG tablet Commonly known as: XANAX TAKE 2 TABLETS(0.5 MG) BY MOUTH AT BEDTIME AS NEEDED FOR SLEEP   aspirin 81 MG tablet Take 81 mg by mouth daily.   calcium carbonate 600 MG Tabs tablet Commonly known as: OS-CAL Take 1,200 mg by mouth daily.   Dilantin 100 MG ER capsule Generic drug: phenytoin TAKE 5 CAPSULES DAILY AS   INSTRUCTED   ezetimibe 10 MG tablet Commonly known as: Zetia Take 1 tablet (10 mg total) by mouth daily.   fexofenadine 180 MG tablet Commonly known as: ALLEGRA Take 180 mg by mouth daily.   Fish Oil 300 MG Caps Take by mouth. (Mega Red)   fluocinonide gel 0.05 % Commonly known as: LIDEX Apply 1 application topically 3 (three) times daily as needed.   naproxen 500 MG tablet Commonly known as: NAPROSYN Take 1 tablet (500 mg total) by mouth daily as needed for moderate pain.   rosuvastatin 10 MG tablet Commonly known as: CRESTOR Take 1 tablet (10 mg total) by mouth daily.   TURMERIC PO Take by mouth.   vitamin B-12 500 MCG tablet Commonly known as: CYANOCOBALAMIN Take 1,000 mcg by mouth  daily.   vitamin C 1000 MG tablet Take 1,000 mg by mouth daily.   Vitamin D3 50 MCG (2000 UT) Tabs Take 1,000 Units by mouth daily.          Objective:   Physical Exam There were no vitals taken for this visit. No vital signs available.  He seems alert oriented x3 and in no distress.    Assessment      Assessment Hyperlipidemia Insomnia-- xanax prn, rx per pcp Allergies Syncope, in the 1980s, on Dilantin since then, used to see Dr. Erling Cruz, reluctant to stop dilantin Elevated LFTs: on-off x years, hepatitis B C -2008, iron, ferritin, alpha-1 antitrypsin normal 2016. CT 4-/2018: Hepatic steatosis.  SPEP/2019- Incomplete RBBB-previously a stress test was  negative Osteopenia?   DEXA 07/18/2016 normal  PLAN: Low-grade fever: As described above, started 4 weeks ago, only associated symptoms are mild cough neck stiffness (presentation not consistent with bacterial meningitis). Was  test for Covid  3 days ago, results pending. Plan: If Covid test negative he will let me know and we will proceed with further work-up including CBC with blood smear, CMP, sed rate, UA, and a chest x-ray. If symptoms severe, have a rash or about headache: Seek medical attention.    I discussed the assessment and treatment plan with the patient. The patient was provided an opportunity to ask questions and all were answered. The patient agreed with the plan and demonstrated an understanding of the instructions.   The patient was advised to call back or seek an in-person evaluation if the symptoms worsen or if the condition fails to improve as anticipated.

## 2019-12-05 NOTE — Assessment & Plan Note (Signed)
Low-grade fever: As described above, started 4 weeks ago, only associated symptoms are mild cough neck stiffness (presentation not consistent with bacterial meningitis). Was  test for Covid  3 days ago, results pending. Plan: If Covid test negative he will let me know and we will proceed with further work-up including CBC with blood smear, CMP, sed rate, UA, and a chest x-ray. If symptoms severe, have a rash or about headache: Seek medical attention.

## 2019-12-07 ENCOUNTER — Other Ambulatory Visit: Payer: Self-pay

## 2019-12-07 ENCOUNTER — Other Ambulatory Visit: Payer: Federal, State, Local not specified - PPO

## 2019-12-07 DIAGNOSIS — Z20822 Contact with and (suspected) exposure to covid-19: Secondary | ICD-10-CM

## 2019-12-08 LAB — NOVEL CORONAVIRUS, NAA: SARS-CoV-2, NAA: NOT DETECTED

## 2019-12-08 LAB — SARS-COV-2, NAA 2 DAY TAT

## 2019-12-14 ENCOUNTER — Encounter: Payer: Self-pay | Admitting: Internal Medicine

## 2019-12-15 ENCOUNTER — Other Ambulatory Visit: Payer: Self-pay

## 2019-12-15 ENCOUNTER — Ambulatory Visit (INDEPENDENT_AMBULATORY_CARE_PROVIDER_SITE_OTHER): Payer: Medicare Other | Admitting: Internal Medicine

## 2019-12-15 ENCOUNTER — Encounter: Payer: Self-pay | Admitting: Internal Medicine

## 2019-12-15 VITALS — BP 143/80 | HR 72 | Temp 98.2°F | Resp 16 | Ht 68.0 in | Wt 205.0 lb

## 2019-12-15 DIAGNOSIS — J029 Acute pharyngitis, unspecified: Secondary | ICD-10-CM

## 2019-12-15 MED ORDER — AMOXICILLIN 875 MG PO TABS
875.0000 mg | ORAL_TABLET | Freq: Two times a day (BID) | ORAL | 0 refills | Status: DC
Start: 2019-12-15 — End: 2020-01-12

## 2019-12-15 NOTE — Progress Notes (Signed)
Pre visit review using our clinic review tool, if applicable. No additional management support is needed unless otherwise documented below in the visit note. 

## 2019-12-15 NOTE — Patient Instructions (Signed)
Continue using Flonase  Start antibiotic for 1 week, amoxicillin.  Call me if not gradually better  Seek medical attention anytime if you feel the symptoms are severe

## 2019-12-15 NOTE — Progress Notes (Signed)
Subjective:    Patient ID: Gaynell Face., male    DOB: 03-26-1956, 64 y.o.   MRN: [id number]  DOS:  12/15/2019 Type of visit - description: Acute Was seen 12/03/2019 with subjective low-grade fever, that is largely resolved. The reason for today's visit is sore throat associated with a enlarged uvula. He has been tested for COVID-19 twice, negative results.  Denies itchy eyes or nose. No sneezing. No sinus pain or congestion. No postnasal dripping No GERD symptoms Admits to occasional snoring, energy level normal. Denies lip or tongue swelling  Review of Systems See above   Past Medical History:  Diagnosis Date  . Allergy   . Hx of colonic polyp 2007  . Hyperlipidemia   . Insomnia 06/02/2014  . Syncope    in 20s; no etiology, no recurrence. Dilantin Rxed    Past Surgical History:  Procedure Laterality Date  . colonoscopy with polypectomy  2007   Dr Carlean Purl, 4 mm rectal polyp  . DENTAL SURGERY     bone grafts, to get implants  . NASAL FRACTURE SURGERY    . TONSILLECTOMY AND ADENOIDECTOMY      Allergies as of 12/15/2019   No Known Allergies     Medication List       Accurate as of December 15, 2019 11:59 PM. If you have any questions, ask your nurse or doctor.        ALPRAZolam 0.25 MG tablet Commonly known as: XANAX TAKE 2 TABLETS(0.5 MG) BY MOUTH AT BEDTIME AS NEEDED FOR SLEEP   amoxicillin 875 MG tablet Commonly known as: AMOXIL Take 1 tablet (875 mg total) by mouth 2 (two) times daily. Started by: Kathlene November, MD   aspirin 81 MG tablet Take 81 mg by mouth daily.   calcium carbonate 600 MG Tabs tablet Commonly known as: OS-CAL Take 1,200 mg by mouth daily.   Dilantin 100 MG ER capsule Generic drug: phenytoin TAKE 5 CAPSULES DAILY AS   INSTRUCTED   ezetimibe 10 MG tablet Commonly known as: Zetia Take 1 tablet (10 mg total) by mouth daily.   fexofenadine 180 MG tablet Commonly known as: ALLEGRA Take 180 mg by mouth daily.   Fish Oil 300  MG Caps Take by mouth. (Mega Red)   fluocinonide gel 0.05 % Commonly known as: LIDEX Apply 1 application topically 3 (three) times daily as needed.   naproxen 500 MG tablet Commonly known as: NAPROSYN Take 1 tablet (500 mg total) by mouth daily as needed for moderate pain.   PROBIOTIC ADVANCED PO Take by mouth.   rosuvastatin 10 MG tablet Commonly known as: CRESTOR Take 1 tablet (10 mg total) by mouth daily.   TURMERIC PO Take by mouth.   vitamin B-12 500 MCG tablet Commonly known as: CYANOCOBALAMIN Take 1,000 mcg by mouth daily.   vitamin C 1000 MG tablet Take 1,000 mg by mouth daily.   Vitamin D3 50 MCG (2000 UT) Tabs Take 1,000 Units by mouth daily.          Objective:   Physical Exam BP (!) 143/80 (BP Location: Left Arm, Patient Position: Sitting, Cuff Size: Normal)   Pulse 72   Temp 98.2 F (36.8 C) (Oral)   Resp 16   Ht 5\' 8"  (1.727 m)   Wt 205 lb (93 kg)   SpO2 96%   BMI 31.17 kg/m  General:   Well developed, NAD, BMI noted. HEENT:  Normocephalic . Face symmetric, atraumatic Throat: Uvula is indeed enlarged, slightly red,  slightly swollen.  No lesions, ulcers or white patches Throat: Minimal redness no white patches. Ears: Normal Neck: No lymphadenopathies. Lungs:  CTA B Normal respiratory effort, no intercostal retractions, no accessory muscle use. Heart: RRR,  no murmur.  Lower extremities: no pretibial edema bilaterally  Skin: Not pale. Not jaundice Neurologic:  alert & oriented X3.  Speech normal, gait appropriate for age and unassisted Psych--  Cognition and judgment appear intact.  Cooperative with normal attention span and concentration.  Behavior appropriate. No anxious or depressed appearing.      Assessment     Assessment Hyperlipidemia Insomnia-- xanax prn, rx per pcp Allergies Syncope, in the 1980s, on Dilantin since then, used to see Dr. Erling Cruz, reluctant to stop dilantin Elevated LFTs: on-off x years, hepatitis B C  -2008, iron, ferritin, alpha-1 antitrypsin normal 2016. CT 4-/2018: Hepatic steatosis.  SPEP/2019- Incomplete RBBB-previously a stress test was negative Osteopenia?   DEXA 07/18/2016 normal  PLAN: Sore throat: As described above, uvula slightly enlarged, see physical exam, no difficulty breathing or swallowing. Unclear etiology, he does not take ACE inhibitors, no new medications, no allergy symptoms or GERD.  He is a non-smoker. A enlarged uvula and sore throat could be related to snoring. Plan: Empiric amoxicillin for 1 week, he already started Flonase. If severe symptoms: needs to seek medical attention If not gradually improving will let me know, ENT referral would be my next step   This visit occurred during the SARS-CoV-2 public health emergency.  Safety protocols were in place, including screening questions prior to the visit, additional usage of staff PPE, and extensive cleaning of exam room while observing appropriate contact time as indicated for disinfecting solutions.

## 2019-12-16 NOTE — Assessment & Plan Note (Signed)
Sore throat: As described above, uvula slightly enlarged, see physical exam, no difficulty breathing or swallowing. Unclear etiology, he does not take ACE inhibitors, no new medications, no allergy symptoms or GERD.  He is a non-smoker. A enlarged uvula and sore throat could be related to snoring. Plan: Empiric amoxicillin for 1 week, he already started Flonase. If severe symptoms: needs to seek medical attention If not gradually improving will let me know, ENT referral would be my next step

## 2019-12-22 ENCOUNTER — Encounter: Payer: Medicare Other | Admitting: Internal Medicine

## 2020-01-12 ENCOUNTER — Other Ambulatory Visit: Payer: Self-pay

## 2020-01-12 ENCOUNTER — Ambulatory Visit (INDEPENDENT_AMBULATORY_CARE_PROVIDER_SITE_OTHER): Payer: Medicare Other | Admitting: Internal Medicine

## 2020-01-12 ENCOUNTER — Encounter: Payer: Self-pay | Admitting: Internal Medicine

## 2020-01-12 VITALS — BP 155/85 | HR 71 | Temp 98.1°F | Resp 18 | Ht 68.0 in | Wt 207.2 lb

## 2020-01-12 DIAGNOSIS — M25522 Pain in left elbow: Secondary | ICD-10-CM

## 2020-01-12 DIAGNOSIS — Z0000 Encounter for general adult medical examination without abnormal findings: Secondary | ICD-10-CM | POA: Diagnosis not present

## 2020-01-12 DIAGNOSIS — Z23 Encounter for immunization: Secondary | ICD-10-CM

## 2020-01-12 DIAGNOSIS — E785 Hyperlipidemia, unspecified: Secondary | ICD-10-CM

## 2020-01-12 DIAGNOSIS — R55 Syncope and collapse: Secondary | ICD-10-CM

## 2020-01-12 NOTE — Progress Notes (Signed)
Pre visit review using our clinic review tool, if applicable. No additional management support is needed unless otherwise documented below in the visit note. 

## 2020-01-12 NOTE — Patient Instructions (Addendum)
Check the  blood pressure weekly BP GOAL is between 110/65 and  135/85. If it is consistently higher or lower, let me know    Kenneth [name], Kenneth [name] back for   blood work fasting the next few days  Come back for a checkup in 6 months

## 2020-01-12 NOTE — Progress Notes (Signed)
Subjective:    Patient ID: Kenneth [name]., male    DOB: May 28, 1955, 64 y.o.   MRN: [id number]  DOS:  01/12/2020 Type of visit - description: CPX Feels well. Was seen without uvula issue last time >>> better. BP today slightly elevated, he reports some stress, work-related. Also has on and off pain on the left elbow, occasionally feels like a catch there with some pain, typically with flexion extension but only with certain arm positions.  BP Readings from Last 3 Encounters:  01/12/20 (!) 155/85  12/15/19 (!) 143/80  12/03/19 (!) 166/85   Wt Readings from Last 3 Encounters:  01/12/20 207 lb 4 oz (94 kg)  12/15/19 205 lb (93 kg)  05/26/19 202 lb 6 oz (91.8 kg)     Review of Systems See above   Past Medical History:  Diagnosis Date  . Allergy   . Hx of colonic polyp 2007  . Hyperlipidemia   . Insomnia 06/02/2014  . Syncope    in 20s; no etiology, no recurrence. Dilantin Rxed    Past Surgical History:  Procedure Laterality Date  . colonoscopy with polypectomy  2007   Dr Carlean Purl, 4 mm rectal polyp  . DENTAL SURGERY     bone grafts, to get implants  . NASAL FRACTURE SURGERY    . TONSILLECTOMY AND ADENOIDECTOMY      Allergies as of 01/12/2020   No Known Allergies     Medication List       Accurate as of January 12, 2020 11:59 PM. If you have any questions, ask your nurse or doctor.        STOP taking these medications   amoxicillin 875 MG tablet Commonly known as: AMOXIL Stopped by: Kathlene November, MD     TAKE these medications   ALPRAZolam 0.25 MG tablet Commonly known as: XANAX TAKE 2 TABLETS(0.5 MG) BY MOUTH AT BEDTIME AS NEEDED FOR SLEEP   aspirin 81 MG tablet Take 81 mg by mouth daily.   calcium carbonate 600 MG Tabs tablet Commonly known as: OS-CAL Take 1,200 mg by mouth daily.   Dilantin 100 MG ER capsule Generic drug: phenytoin TAKE 5 CAPSULES DAILY AS   INSTRUCTED   ezetimibe 10 MG tablet Commonly known as: Zetia Take 1 tablet (10 mg  total) by mouth daily.   fexofenadine 180 MG tablet Commonly known as: ALLEGRA Take 180 mg by mouth daily.   Fish Oil 300 MG Caps Take by mouth. (Mega Red)   fluocinonide gel 0.05 % Commonly known as: LIDEX Apply 1 application topically 3 (three) times daily as needed.   naproxen 500 MG tablet Commonly known as: NAPROSYN Take 1 tablet (500 mg total) by mouth daily as needed for moderate pain.   PROBIOTIC ADVANCED PO Take by mouth.   rosuvastatin 10 MG tablet Commonly known as: CRESTOR Take 1 tablet (10 mg total) by mouth daily.   TURMERIC PO Take by mouth.   vitamin B-12 500 MCG tablet Commonly known as: CYANOCOBALAMIN Take 1,000 mcg by mouth daily.   vitamin C 1000 MG tablet Take 1,000 mg by mouth daily.   Vitamin D3 50 MCG (2000 UT) Tabs Take 1,000 Units by mouth daily.          Objective:   Physical Exam BP (!) 155/85   Pulse 71   Temp 98.1 F (36.7 C) (Oral)   Resp 18   Ht 5\' 8"  (1.727 m)   Wt 207 lb 4 oz (94 kg)  SpO2 98%   BMI 31.51 kg/m  General: Well developed, NAD, BMI noted Neck: No  thyromegaly, no LAD HEENT:  Normocephalic . [name] symmetric, atraumatic.  Uvula: It is long but otherwise normal-appearing with no redness, swelling, edema Lungs:  CTA B Normal respiratory effort, no intercostal retractions, no accessory muscle use. Heart: RRR,  no murmur.  Abdomen:  Not distended, soft, non-tender. No rebound or rigidity.   Lower extremities: no pretibial edema bilaterally MSK: Left elbow normal Skin: Exposed areas without rash. Not pale. Not jaundice Neurologic:  alert & oriented X3.  Speech normal, gait appropriate for age and unassisted Strength symmetric and appropriate for age.  Psych: Cognition and judgment appear intact.  Cooperative with normal attention span and concentration.  Behavior appropriate. No anxious or depressed appearing.     Assessment     Assessment Hyperlipidemia Insomnia-- xanax prn, rx per  pcp Allergies Syncope, in the 1980s, on Dilantin since then, used to see Dr. Erling Cruz, reluctant to stop dilantin Elevated LFTs: on-off x years, hepatitis B C -2008, iron, ferritin, alpha-1 antitrypsin normal 2016. CT 4-/2018: Hepatic steatosis.  SPEP/2019- Incomplete RBBB-previously a stress test was negative Osteopenia?   DEXA 07/18/2016 normal  PLAN: Here for CPX Elevated BP: recheck  --155/85.  We agreed on monitoring at home Hyperlipidemia: On Crestor, Zetia was added with last results, recheck labs. Insomnia: On Xanax as needed. Seizure disorder: On Dilantin, checking levels. Sore throat: See last visit, symptoms essentially resolved, uvula is long on exam but otherwise normal; we talk about possibly a ENT referral but we agreed on observation for now. Left elbow pain: Etiology unclear, send to Ortho. RTC 6 months  This visit occurred during the SARS-CoV-2 public health emergency.  Safety protocols were in place, including screening questions prior to the visit, additional usage of staff PPE, and extensive cleaning of exam room while observing appropriate contact time as indicated for disinfecting solutions.

## 2020-01-13 ENCOUNTER — Other Ambulatory Visit (INDEPENDENT_AMBULATORY_CARE_PROVIDER_SITE_OTHER): Payer: Federal, State, Local not specified - PPO

## 2020-01-13 DIAGNOSIS — Z0000 Encounter for general adult medical examination without abnormal findings: Secondary | ICD-10-CM

## 2020-01-13 DIAGNOSIS — R55 Syncope and collapse: Secondary | ICD-10-CM

## 2020-01-13 DIAGNOSIS — E785 Hyperlipidemia, unspecified: Secondary | ICD-10-CM

## 2020-01-14 ENCOUNTER — Encounter: Payer: Self-pay | Admitting: Internal Medicine

## 2020-01-14 LAB — CBC WITH DIFFERENTIAL/PLATELET
Absolute Monocytes: 679 cells/uL (ref 200–950)
Basophils Absolute: 29 cells/uL (ref 0–200)
Basophils Relative: 0.4 %
Eosinophils Absolute: 161 cells/uL (ref 15–500)
Eosinophils Relative: 2.2 %
HCT: 46.5 % (ref 38.5–50.0)
Hemoglobin: 16.4 g/dL (ref 13.2–17.1)
Lymphs Abs: 1095 cells/uL (ref 850–3900)
MCH: 34.5 pg — ABNORMAL HIGH (ref 27.0–33.0)
MCHC: 35.3 g/dL (ref 32.0–36.0)
MCV: 97.9 fL (ref 80.0–100.0)
MPV: 10.2 fL (ref 7.5–12.5)
Monocytes Relative: 9.3 %
Neutro Abs: 5336 cells/uL (ref 1500–7800)
Neutrophils Relative %: 73.1 %
Platelets: 210 10*3/uL (ref 140–400)
RBC: 4.75 10*6/uL (ref 4.20–5.80)
RDW: 11.6 % (ref 11.0–15.0)
Total Lymphocyte: 15 %
WBC: 7.3 10*3/uL (ref 3.8–10.8)

## 2020-01-14 LAB — COMPREHENSIVE METABOLIC PANEL
AG Ratio: 1.9 (calc) (ref 1.0–2.5)
ALT: 95 U/L — ABNORMAL HIGH (ref 9–46)
AST: 64 U/L — ABNORMAL HIGH (ref 10–35)
Albumin: 4.2 g/dL (ref 3.6–5.1)
Alkaline phosphatase (APISO): 80 U/L (ref 35–144)
BUN: 21 mg/dL (ref 7–25)
CO2: 25 mmol/L (ref 20–32)
Calcium: 9.1 mg/dL (ref 8.6–10.3)
Chloride: 106 mmol/L (ref 98–110)
Creat: 0.86 mg/dL (ref 0.70–1.25)
Globulin: 2.2 g/dL (calc) (ref 1.9–3.7)
Glucose, Bld: 100 mg/dL — ABNORMAL HIGH (ref 65–99)
Potassium: 4.5 mmol/L (ref 3.5–5.3)
Sodium: 140 mmol/L (ref 135–146)
Total Bilirubin: 0.7 mg/dL (ref 0.2–1.2)
Total Protein: 6.4 g/dL (ref 6.1–8.1)

## 2020-01-14 LAB — LIPID PANEL
Cholesterol: 156 mg/dL (ref <200)
HDL: 67 mg/dL (ref >=40)
LDL Cholesterol (Calc): 69 mg/dL (calc)
Non-HDL Cholesterol (Calc): 89 mg/dL (calc) (ref <130)
Total CHOL/HDL Ratio: 2.3 (calc) (ref <5.0)
Triglycerides: 110 mg/dL (ref <150)

## 2020-01-14 LAB — PHENYTOIN LEVEL, TOTAL: Phenytoin, Total: 16.5 mg/L (ref 10.0–20.0)

## 2020-01-14 NOTE — Assessment & Plan Note (Signed)
-  Td  [id number] - prevnar:  11/19/2018 - pnm 23: Today - s/p shingrex - had covid shots - rec flu shot  today -CCS: Last colonoscopy 2013, neg ---- next 2023 Prostate cancer screening: DRE- PSA wnl 2020.   -labs: CMP, FLP, CBC, phenytoin levels -Diet and exercise discussed

## 2020-01-14 NOTE — Assessment & Plan Note (Signed)
Here for CPX Elevated BP: recheck  --155/85.  We agreed on monitoring at home Hyperlipidemia: On Crestor, Zetia was added with last results, recheck labs. Insomnia: On Xanax as needed. Seizure disorder: On Dilantin, checking levels. Sore throat: See last visit, symptoms essentially resolved, uvula is long on exam but otherwise normal; we talk about possibly a ENT referral but we agreed on observation for now. Left elbow pain: Etiology unclear, send to Ortho. RTC 6 months

## 2020-02-07 ENCOUNTER — Other Ambulatory Visit: Payer: Self-pay | Admitting: Internal Medicine

## 2020-03-01 ENCOUNTER — Telehealth: Payer: Self-pay | Admitting: Internal Medicine

## 2020-03-01 NOTE — Telephone Encounter (Signed)
PDMP okay, Rx sent 

## 2020-03-01 NOTE — Telephone Encounter (Signed)
Requesting: alprazolam 0.25mg  Contract: 07/21/2017 UDS: None per [facility] Last Visit: 01/12/2020 Next Visit: 07/12/2020 Last Refill: 06/29/2019 #60 and 3RF Pt sig: 2 tabs qhs prn  Please Advise

## 2020-05-07 ENCOUNTER — Other Ambulatory Visit: Payer: Self-pay | Admitting: Internal Medicine

## 2020-05-18 ENCOUNTER — Other Ambulatory Visit: Payer: Self-pay | Admitting: Internal Medicine

## 2020-05-30 ENCOUNTER — Encounter: Payer: Self-pay | Admitting: Family Medicine

## 2020-05-30 ENCOUNTER — Encounter: Payer: Self-pay | Admitting: Internal Medicine

## 2020-05-30 ENCOUNTER — Telehealth (INDEPENDENT_AMBULATORY_CARE_PROVIDER_SITE_OTHER): Payer: Medicare Other | Admitting: Family Medicine

## 2020-05-30 ENCOUNTER — Other Ambulatory Visit: Payer: Self-pay

## 2020-05-30 VITALS — Ht 68.0 in | Wt 200.0 lb

## 2020-05-30 DIAGNOSIS — U071 COVID-19: Secondary | ICD-10-CM | POA: Diagnosis not present

## 2020-05-30 NOTE — Progress Notes (Signed)
Virtual Visit via Video Note  I connected with Kenneth [name]. on 05/30/20 at  2:20 PM EST by a video enabled telemedicine application and verified that I am speaking with the correct person using two identifiers.  Location/ participants  Patient: home with wife  Provider: office    I discussed the limitations of evaluation and management by telemedicine and the availability of in person appointments. The patient expressed understanding and agreed to proceed.  History of Present Illness: Pt is home c/o cough and congestion x 2-3 weeks ---it prgressed so he did a home test and it was positive x3   cough is mild-- no fever He is supposed to leave for [place] for a week -- leaving sat  Observations/Objective: There were no vitals filed for this visit. Pt is in nad No sob  . Assessment and Plan: 1. COVID-19 Symptoms improving today is day 7----- he has a pcr test scheduled for Thursday  Call if symptoms worsen , fever develop etc    Follow Up Instructions:    I discussed the assessment and treatment plan with the patient. The patient was provided an opportunity to ask questions and all were answered. The patient agreed with the plan and demonstrated an understanding of the instructions.   The patient was advised to call back or seek an in-person evaluation if the symptoms worsen or if the condition fails to improve as anticipated.  I provided 25 minutes of non-[name]-to-[name] time during this encounter.   Ann Held, DO

## 2020-06-01 ENCOUNTER — Encounter: Payer: Self-pay | Admitting: Family Medicine

## 2020-06-01 NOTE — Telephone Encounter (Signed)
10 days total should be enough I'm so sorry you have to cancel your trip  I will forward this to Dr Larose Kells as well

## 2020-07-12 ENCOUNTER — Other Ambulatory Visit: Payer: Self-pay

## 2020-07-12 ENCOUNTER — Encounter: Payer: Self-pay | Admitting: Internal Medicine

## 2020-07-12 ENCOUNTER — Ambulatory Visit (INDEPENDENT_AMBULATORY_CARE_PROVIDER_SITE_OTHER): Payer: Medicare Other | Admitting: Internal Medicine

## 2020-07-12 VITALS — BP 136/78 | HR 62 | Temp 98.4°F | Resp 16 | Ht 68.0 in | Wt 202.2 lb

## 2020-07-12 DIAGNOSIS — E785 Hyperlipidemia, unspecified: Secondary | ICD-10-CM

## 2020-07-12 DIAGNOSIS — G4700 Insomnia, unspecified: Secondary | ICD-10-CM

## 2020-07-12 NOTE — Patient Instructions (Addendum)
    GO TO THE FRONT DESK, PLEASE SCHEDULE YOUR APPOINTMENTS Come back for a physical in 6 months  Advanced care planning ("Living will", "[place] of attorney")  Advance care planning is a process that supports adults in  understanding and sharing their preferences regarding future medical care.   The patient's preferences are recorded in documents called Advance Directives.    Advanced directives are completed (and can be modified at any time) while the patient is in full mental capacity.   The documentation should be available at all times to the patient, the family and the healthcare providers.  Bring in a copy to be scanned in your chart is an excellent idea and is recommended   This legal documents direct treatment decision making and/or appoint a surrogate to make the decision if the patient is not capable to do so.    Advance directives can be documented in many types of formats,  documents have names such as:  Lliving will  Durable power of attorney for healthcare (healthcare proxy or healthcare power of attorney)  Combined directives  Physician orders for life-sustaining treatment    More information at:  [email or web address]

## 2020-07-12 NOTE — Progress Notes (Signed)
Subjective:    Patient ID: Kenneth [name]., male    DOB: 19-Aug-1955, 65 y.o.   MRN: [id number]  DOS:  07/12/2020 Type of visit - description: Follow up He  is doing well. He took a Covid test before taking a trip and it came back positive, at that time the only sxs were mild sore throat and cough. No chest pain, no difficulty breathing, no fever chills. He feels back to normal.   Review of Systems See above   Past Medical History:  Diagnosis Date  . Allergy   . Hx of colonic polyp 2007  . Hyperlipidemia   . Insomnia 06/02/2014  . Syncope    in 20s; no etiology, no recurrence. Dilantin Rxed    Past Surgical History:  Procedure Laterality Date  . colonoscopy with polypectomy  2007   Dr Carlean Purl, 4 mm rectal polyp  . DENTAL SURGERY     bone grafts, to get implants  . NASAL FRACTURE SURGERY    . TONSILLECTOMY AND ADENOIDECTOMY      Allergies as of 07/12/2020   No Known Allergies     Medication List       Accurate as of July 12, 2020  3:44 PM. If you have any questions, ask your nurse or doctor.        ALPRAZolam 0.25 MG tablet Commonly known as: XANAX TAKE 2 TABLETS(0.5 MG) BY MOUTH AT BEDTIME AS NEEDED FOR SLEEP   aspirin 81 MG tablet Take 81 mg by mouth daily.   calcium carbonate 600 MG Tabs tablet Commonly known as: OS-CAL Take 1,200 mg by mouth daily.   Dilantin 100 MG ER capsule Generic drug: phenytoin Take 5 capsules by mouth daily as directed   ezetimibe 10 MG tablet Commonly known as: ZETIA Take 1 tablet (10 mg total) by mouth daily.   fexofenadine 180 MG tablet Commonly known as: ALLEGRA Take 180 mg by mouth daily.   Fish Oil 300 MG Caps Take by mouth. (Mega Red)   fluocinonide gel 0.05 % Commonly known as: LIDEX Apply 1 application topically 3 (three) times daily as needed.   Melatonin 10 MG Tabs Take by mouth.   naproxen 500 MG tablet Commonly known as: NAPROSYN Take 1 tablet (500 mg total) by mouth daily as needed for  moderate pain.   PROBIOTIC ADVANCED PO Take by mouth.   rosuvastatin 10 MG tablet Commonly known as: CRESTOR Take 1 tablet (10 mg total) by mouth daily.   TURMERIC PO Take by mouth.   vitamin B-12 500 MCG tablet Commonly known as: CYANOCOBALAMIN Take 1,000 mcg by mouth daily.   vitamin C 1000 MG tablet Take 1,000 mg by mouth daily.   Vitamin D3 50 MCG (2000 UT) Tabs Take 1,000 Units by mouth daily.          Objective:   Physical Exam BP 136/78 (BP Location: Left Arm, Patient Position: Sitting, Cuff Size: Normal)   Pulse 62   Temp 98.4 F (36.9 C) (Oral)   Resp 16   Ht 5\' 8"  (1.727 m)   Wt 202 lb 4 oz (91.7 kg)   SpO2 96%   BMI 30.75 kg/m  General:   Well developed, NAD, BMI noted. HEENT:  Normocephalic . [name] symmetric, atraumatic Lungs:  CTA B Normal respiratory effort, no intercostal retractions, no accessory muscle use. Heart: RRR,  no murmur.  Lower extremities: no pretibial edema bilaterally  Skin: Not pale. Not jaundice Neurologic:  alert & oriented X3.  Speech  normal, gait appropriate for age and unassisted Psych--  Cognition and judgment appear intact.  Cooperative with normal attention span and concentration.  Behavior appropriate. No anxious or depressed appearing.      Assessment     Assessment Hyperlipidemia Insomnia-- xanax prn, rx per pcp Allergies Syncope, in the 1980s, on Dilantin since then, used to see Dr. Erling Cruz, reluctant to stop dilantin Elevated LFTs: on-off x years, hepatitis B C -2008, iron, ferritin, alpha-1 antitrypsin normal 2016. CT 4-/2018: Hepatic steatosis.  SPEP/2019- Incomplete RBBB-previously a stress test was negative Osteopenia?   DEXA 07/18/2016 normal Covid infection 05-2020   PLAN: COVID-19: Diagnosed few weeks ago, as described above, he was essentially asymptomatic. Insomnia: On Xanax, contract signed today, he also takes melatonin with good results. Hyperlipidemia: Last FLP very good, LFTs at baseline,  continue Zetia and Crestor. Syncope, on Dilantin for years, last levels very good. Preventive care: Reports he does have a healthcare power of attorney, recommend to bring the copy. RTC 6 months CPX     This visit occurred during the SARS-CoV-2 public health emergency.  Safety protocols were in place, including screening questions prior to the visit, additional usage of staff PPE, and extensive cleaning of exam room while observing appropriate contact time as indicated for disinfecting solutions.

## 2020-07-12 NOTE — Assessment & Plan Note (Signed)
COVID-19: Diagnosed few weeks ago, as described above, he was essentially asymptomatic. Insomnia: On Xanax, contract signed today, he also takes melatonin with good results. Hyperlipidemia: Last FLP very good, LFTs at baseline, continue Zetia and Crestor. Syncope, on Dilantin for years, last levels very good. Preventive care: Reports he does have a healthcare power of attorney, recommend to bring the copy. RTC 6 months CPX

## 2020-09-14 ENCOUNTER — Other Ambulatory Visit: Payer: Self-pay | Admitting: Internal Medicine

## 2020-10-09 ENCOUNTER — Telehealth: Payer: Self-pay | Admitting: Internal Medicine

## 2020-10-09 NOTE — Telephone Encounter (Signed)
Pdmp ok, rx sent  ? ?

## 2020-10-09 NOTE — Telephone Encounter (Signed)
Requesting: alprazolam 0.25mg  Contract: 07/12/2020 UDS: None d/t $$$ Last Visit: 07/12/2020 Next Visit: 01/16/2021 Last Refill: 03/01/2020 #60 and 4RF  Please Advise

## 2020-11-16 ENCOUNTER — Other Ambulatory Visit: Payer: Self-pay | Admitting: Internal Medicine

## 2020-12-03 ENCOUNTER — Other Ambulatory Visit: Payer: Self-pay | Admitting: Internal Medicine

## 2020-12-18 ENCOUNTER — Other Ambulatory Visit: Payer: Self-pay

## 2020-12-18 ENCOUNTER — Telehealth: Payer: Self-pay

## 2020-12-18 ENCOUNTER — Ambulatory Visit (INDEPENDENT_AMBULATORY_CARE_PROVIDER_SITE_OTHER): Payer: Medicare Other | Admitting: Family Medicine

## 2020-12-18 ENCOUNTER — Ambulatory Visit (HOSPITAL_BASED_OUTPATIENT_CLINIC_OR_DEPARTMENT_OTHER)
Admission: RE | Admit: 2020-12-18 | Discharge: 2020-12-18 | Disposition: A | Discharge status: Home / Self Care | Payer: Medicare Other | Source: Ambulatory Visit | Attending: Family Medicine | Admitting: Family Medicine

## 2020-12-18 ENCOUNTER — Encounter: Payer: Self-pay | Admitting: Family Medicine

## 2020-12-18 VITALS — BP 142/80 | HR 74 | Temp 98.2°F | Resp 18 | Ht 68.0 in | Wt 204.8 lb

## 2020-12-18 DIAGNOSIS — M25562 Pain in left knee: Secondary | ICD-10-CM

## 2020-12-18 DIAGNOSIS — G8929 Other chronic pain: Secondary | ICD-10-CM

## 2020-12-18 DIAGNOSIS — M25561 Pain in right knee: Secondary | ICD-10-CM | POA: Diagnosis present

## 2020-12-18 MED ORDER — TRAMADOL HCL 50 MG PO TABS: 50.0000 mg | ORAL_TABLET | Freq: Three times a day (TID) | ORAL | 0 refills | Status: AC | PRN

## 2020-12-18 NOTE — Progress Notes (Signed)
Established Patient Office Visit  Subjective:  Patient ID: Kenneth Reagle., male    DOB: [birth year]  Age: 65 y.o. MRN: [id number]  CC:  Chief Complaint  Patient presents with  . Knee Pain    Both knees, x1 month, Pt states no injury or falls. Pt states having trouble sleeping due to the pain    HPI Kenneth [name]. presents for b/l knee pain---  ongoing for years but worse over the last month when he did yard work on his knees.   He has not slept much due to pain   he has tried naproxyn / IB and voltaren gel which helps some but the last couple of nights it has been worse esp in L knee but pain is in both   Past Medical History:  Diagnosis Date  . Allergy   . Hx of colonic polyp 2007  . Hyperlipidemia   . Insomnia 06/02/2014  . Syncope    in 20s; no etiology, no recurrence. Dilantin Rxed    Past Surgical History:  Procedure Laterality Date  . colonoscopy with polypectomy  2007   Dr Carlean Purl, 4 mm rectal polyp  . DENTAL SURGERY     bone grafts, to get implants  . NASAL FRACTURE SURGERY    . TONSILLECTOMY AND ADENOIDECTOMY      Family History  Problem Relation Age of Onset  . Macular degeneration Father   . Cancer Mother        throat; smoker  . Macular degeneration Paternal Uncle   . Rectal cancer Other        maternal cousin  . Colon cancer Neg Hx   . Esophageal cancer Neg Hx   . Stomach cancer Neg Hx   . Stroke Neg Hx   . Heart disease Neg Hx   . Diabetes Neg Hx   . Prostate cancer Neg Hx     Social History   Socioeconomic History  . Marital status: Married    Spouse name: Not on file  . Number of children: 0  . Years of education: Not on file  . Highest education level: Not on file  Occupational History  . 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 Smoking status: Never  . Smokeless tobacco: Never  Substance and Sexual Activity  . Alcohol use: Yes    Alcohol/week: 6.0 - 8.0  standard drinks    Types: 6 - 8 Cans of beer per week    Comment: 1-2 beers qd   . Drug use: No  . Sexual activity: Not on file  Other Topics Concern  . Not on file  Social History Narrative   Household pt and wife   Social Determinants of Health   Financial Resource Strain: Not on file  Food Insecurity: Not on file  Transportation Needs: Not on file  Physical Activity: Not on file  Stress: Not on file  Social Connections: Not on file  Intimate Partner Violence: Not on file    Outpatient Medications Prior to Visit  Medication Sig Dispense Refill  . ALPRAZolam (XANAX) 0.25 MG tablet TAKE 2 TABLETS(0.5 MG) BY MOUTH AT BEDTIME AS NEEDED FOR SLEEP 60 tablet 3  . Ascorbic Acid (VITAMIN C) 1000 MG tablet Take 1,000 mg by mouth daily.    Marland Kitchen aspirin 81 MG tablet Take 81 mg by mouth daily.    . calcium carbonate (OS-CAL) 600 MG TABS Take 1,200 mg by  mouth daily.    . Cholecalciferol (VITAMIN D3) 2000 UNITS TABS Take 1,000 Units by mouth daily.    . cyanocobalamin 500 MCG tablet Take 1,000 mcg by mouth daily.    Marland Kitchen DILANTIN 100 MG ER capsule TAKE 5 CAPSULES DAILY AS   DIRECTED 450 capsule 1  . ezetimibe (ZETIA) 10 MG tablet Take 1 tablet (10 mg total) by mouth daily. 90 tablet 1  . fexofenadine (ALLEGRA) 180 MG tablet Take 180 mg by mouth daily.    . fluocinonide gel (LIDEX) [id number] % Apply 1 application topically 3 (three) times daily as needed.     . Melatonin 10 MG TABS Take by mouth.    . naproxen (NAPROSYN) 500 MG tablet Take 1 tablet (500 mg total) by mouth daily as needed for moderate pain. 30 tablet 3  . Omega-3 Fatty Acids (FISH OIL) 300 MG CAPS Take by mouth. (Mega Red)    . Probiotic Product (PROBIOTIC ADVANCED PO) Take by mouth.    . rosuvastatin (CRESTOR) 10 MG tablet TAKE 1 TABLET DAILY 90 tablet 1  . TURMERIC PO Take by mouth.     No facility-administered medications prior to visit.    No Known Allergies  ROS Review of Systems  Constitutional: Negative.   HENT:   Negative for congestion, ear pain, hearing loss, nosebleeds, postnasal drip, rhinorrhea, sinus pressure, sneezing and tinnitus.   Eyes:  Negative for photophobia, discharge, itching and visual disturbance.  Respiratory: Negative.    Cardiovascular: Negative.   Gastrointestinal:  Negative for abdominal distention, abdominal pain, anal bleeding, blood in stool and constipation.  Endocrine: Negative.   Genitourinary: Negative.   Musculoskeletal:  Positive for arthralgias, gait problem and joint swelling.  Skin: Negative.   Allergic/Immunologic: Negative.   Neurological:  Negative for dizziness, weakness, light-headedness, numbness and headaches.  Psychiatric/Behavioral:  Negative for agitation, confusion, decreased concentration, dysphoric mood, sleep disturbance and suicidal ideas. The patient is not nervous/anxious.      Objective:    Physical Exam Vitals and nursing note reviewed.  Constitutional:      Appearance: He is well-developed.  HENT:     Head: Normocephalic and atraumatic.  Eyes:     Pupils: Pupils are equal, round, and reactive to light.  Neck:     Thyroid: No thyromegaly.  Cardiovascular:     Rate and Rhythm: Normal rate and regular rhythm.     Heart sounds: No murmur heard. Pulmonary:     Effort: Pulmonary effort is normal. No respiratory distress.     Breath sounds: Normal breath sounds. No wheezing or rales.  Chest:     Chest wall: No tenderness.  Musculoskeletal:        General: Swelling present.     Cervical back: Normal range of motion and neck supple.     Right knee: Swelling and crepitus present. Decreased range of motion. Tenderness present.     Left knee: Swelling and crepitus present. Decreased range of motion. Tenderness present.       Legs:  Skin:    General: Skin is warm and dry.  Neurological:     Mental Status: He is alert and oriented to person, place, and time.  Psychiatric:        Behavior: Behavior normal.        Thought Content: Thought  content normal.        Judgment: Judgment normal.    BP (!) 142/80 (BP Location: Right Arm, Patient Position: Sitting, Cuff Size: Normal)   Pulse 74  Temp 98.2 F (36.8 C) (Oral)   Resp 18   Ht '5\' 8"'$  (1.727 m)   Wt 204 lb 12.8 oz (92.9 kg)   SpO2 96%   BMI 31.14 kg/m  Wt Readings from Last 3 Encounters:  12/18/20 204 lb 12.8 oz (92.9 kg)  07/12/20 202 lb 4 oz (91.7 kg)  05/30/20 200 lb (90.7 kg)     Health Maintenance Due  Topic Date Due  . COVID-19 Vaccine (4 - Booster for Pfizer series) 06/12/2020  . INFLUENZA VACCINE  11/20/2020    There are no preventive care reminders to display for this patient.  Lab Results  Component Value Date   TSH 1.73 07/21/2017   Lab Results  Component Value Date   WBC 7.3 01/13/2020   HGB 16.4 01/13/2020   HCT 46.5 01/13/2020   MCV 97.9 01/13/2020   PLT 210 01/13/2020   Lab Results  Component Value Date   NA 140 01/13/2020   K 4.5 01/13/2020   CO2 25 01/13/2020   GLUCOSE 100 (H) 01/13/2020   BUN 21 01/13/2020   CREATININE 0.86 01/13/2020   BILITOT 0.7 01/13/2020   ALKPHOS 67 11/19/2018   AST 64 (H) 01/13/2020   ALT 95 (H) 01/13/2020   PROT 6.4 01/13/2020   ALBUMIN 4.5 11/19/2018   CALCIUM 9.1 01/13/2020   GFR 89.22 11/19/2018   Lab Results  Component Value Date   CHOL 156 01/13/2020   Lab Results  Component Value Date   HDL 67 01/13/2020   Lab Results  Component Value Date   LDLCALC 69 01/13/2020   Lab Results  Component Value Date   TRIG 110 01/13/2020   Lab Results  Component Value Date   CHOLHDL 2.3 01/13/2020   Lab Results  Component Value Date   HGBA1C 5.4 05/26/2019      Assessment & Plan:   Problem List Items Addressed This Visit   None Visit Diagnoses     Chronic pain of both knees    -  Primary   Relevant Medications   traMADol (ULTRAM) 50 MG tablet   Other Relevant Orders   DG Knee Complete 4 Views Left   DG Knee Complete 4 Views Right       Meds ordered this encounter   Medications  . traMADol (ULTRAM) 50 MG tablet    Sig: Take 1 tablet (50 mg total) by mouth every 8 (eight) hours as needed for up to 5 days.    Dispense:  15 tablet    Refill:  0    Follow-up: Return if symptoms worsen or fail to improve.    Ann Held, DO

## 2020-12-18 NOTE — Telephone Encounter (Signed)
Appt scheduled w/ Dr. Etter Sjogren today at 1:40pm.

## 2020-12-18 NOTE — Patient Instructions (Signed)
Chronic Knee Pain, Adult °Chronic knee pain is pain in one or both knees that lasts longer than 3 months. Symptoms of chronic knee pain may include swelling, stiffness, and discomfort. Age-related wear and tear (osteoarthritis) of the knee joint is the most common cause of chronic knee pain. Other possible causes include: °A long-term immune-related disease that causes inflammation of the knee (rheumatoid arthritis). This usually affects both knees. °Inflammatory arthritis, such as gout or pseudogout. °An injury to the knee that causes arthritis. °An injury to the knee that damages the ligaments. Ligaments are strong tissues that connect bones to each other. °Runner's knee or pain behind the kneecap. °Treatment for chronic knee pain depends on the cause. The main treatments for chronic knee pain are physical therapy and weight loss. This condition may also be treated with medicines, injections, a knee sleeve or brace, and by using crutches. Rest, ice, pressure (compression), and elevation, also known as RICE therapy, may also be recommended. °Follow these instructions at home: °If you have a knee sleeve or brace: ° °Wear the knee sleeve or brace as told by your health care provider. Remove it only as told by your health care provider. °Loosen it if your toes tingle, become numb, or turn cold and blue. °Keep it clean. °If the sleeve or brace is not waterproof: °Do not let it get wet. °Remove it if allowed by your health care provider, or cover it with a watertight covering when you take a bath or a shower. °Managing pain, stiffness, and swelling °  °If directed, apply heat to the affected area as often as told by your health care provider. Use the heat source that your health care provider recommends, such as a moist heat pack or a heating pad. °If you have a removable knee sleeve or brace, remove it as told by your health care provider. °Place a towel between your skin and the heat source. °Leave the heat on for  20-30 minutes. °Remove the heat if your skin turns bright red. This is especially important if you are unable to feel pain, heat, or cold. You may have a greater risk of getting burned. °If directed, put ice on the affected area. To do this: °If you have a removable knee sleeve or brace, remove it as told by your health care provider. °Put ice in a plastic bag. °Place a towel between your skin and the bag. °Leave the ice on for 20 minutes, 2-3 times a day. °Remove the ice if your skin turns bright red. This is very important. If you cannot feel pain, heat, or cold, you have a greater risk of damage to the area. °Move your toes often to reduce stiffness and swelling. °Raise (elevate) the injured area above the level of your heart while you are sitting or lying down. °Activity °Avoid high-impact activities or exercises, such as running, jumping rope, or doing jumping jacks. °Follow the exercise plan that your health care provider designed for you. Your health care provider may suggest that you: °Avoid activities that make knee pain worse. This may require you to change your exercise routines, sport participation, or job duties. °Wear shoes with cushioned soles. °Avoid sports that require running and sudden changes in direction. °Do physical therapy. Physical therapy is planned to match your needs and abilities. It may include exercises for strength, flexibility, stability, and endurance. °Do exercises that increase balance and strength, such as tai chi and yoga. °Do not use the injured limb to support your body weight   until your health care provider says that you can. Use crutches as told by your health care provider. °Return to your normal activities as told by your health care provider. Ask your health care provider what activities are safe for you. °General instructions °Take over-the-counter and prescription medicines only as told by your health care provider. °Lose weight if you are overweight. Losing even a  little weight can reduce knee pain. Ask your health care provider what your ideal weight is, and how to safely lose extra weight. A dietitian may be able to help you plan your meals. °Do not use any products that contain nicotine or tobacco, such as cigarettes, e-cigarettes, and chewing tobacco. These can delay healing. If you need help quitting, ask your health care provider. °Keep all follow-up visits. This is important. °Contact a health care provider if: °You have knee pain that is not getting better or gets worse. °You are unable to do your physical therapy exercises due to knee pain. °Get help right away if: °Your knee swells and the swelling becomes worse. °You cannot move your knee. °You have severe knee pain. °Summary °Knee pain that lasts more than 3 months is considered chronic knee pain. °The main treatments for chronic knee pain are physical therapy and weight loss. You may also need to take medicines, wear a knee sleeve or brace, use crutches, and apply ice or heat. °Losing even a little weight can reduce knee pain. Ask your health care provider what your ideal weight is, and how to safely lose extra weight. A dietitian may be able to help you plan your meals. °Follow the exercise plan that your health care provider designed for you. °This information is not intended to replace advice given to you by your health care provider. Make sure you discuss any questions you have with your health care provider. °Document Revised: 09/22/2019 Document Reviewed: 09/22/2019 °Elsevier Patient Education © 2022 [employer] ° °

## 2020-12-18 NOTE — Telephone Encounter (Signed)
  Client Site [facility] [place] - Night Physician [place], [place] Type Call  [place] Name [place] Phone Number [phone] Patient Name Kenneth [name] Patient DOB 13-Apr-1956 Call Type Message Only Information Provided Reason for Call Request to Schedule Office Appointment Initial Comment Caller states he has severe knee pain and is wanting to schedule an appointment Patient request to speak to RN No Additional Comment Provided office hours

## 2020-12-19 ENCOUNTER — Other Ambulatory Visit: Payer: Self-pay | Admitting: Family Medicine

## 2020-12-19 DIAGNOSIS — G8929 Other chronic pain: Secondary | ICD-10-CM

## 2020-12-20 ENCOUNTER — Ambulatory Visit: Payer: Self-pay

## 2020-12-20 ENCOUNTER — Other Ambulatory Visit: Payer: Self-pay

## 2020-12-20 ENCOUNTER — Ambulatory Visit (INDEPENDENT_AMBULATORY_CARE_PROVIDER_SITE_OTHER): Payer: Medicare Other | Admitting: Sports Medicine

## 2020-12-20 VITALS — BP 142/86 | HR 55 | Ht 68.0 in | Wt 203.0 lb

## 2020-12-20 DIAGNOSIS — M25561 Pain in right knee: Secondary | ICD-10-CM

## 2020-12-20 DIAGNOSIS — G8929 Other chronic pain: Secondary | ICD-10-CM | POA: Diagnosis not present

## 2020-12-20 DIAGNOSIS — M25562 Pain in left knee: Secondary | ICD-10-CM

## 2020-12-20 DIAGNOSIS — M170 Bilateral primary osteoarthritis of knee: Secondary | ICD-10-CM

## 2020-12-20 NOTE — Patient Instructions (Addendum)
Thank you for coming in today.   Tylenol or naproxen as needed  Please complete the exercises that the athletic trainer went over with you:  View at [email or web address] using code: [id number]  Follow up as needed.

## 2020-12-20 NOTE — Progress Notes (Signed)
Benito Mccreedy D.[place] Phone: [phone]   Assessment and Plan:    I, Peterson Lombard, PhD, LAT, ATC, am acting as a Education administrator for [employer], DO, CAQSM.  1. Chronic pain of both knees 2. Osteoarthritis of patellofemoral joints of both knees -Chronic, unchanged, initial sports medicine visit - Likely patellofemoral arthritis without evidence of medial lateral joint arthritis - Start HEP geared at patellar tracking to decrease OA flares - Continue to use padding when kneeling - May continue to use supportive sleeves as needed - Tylenol/naproxen as needed for pain control -Reviewed bilateral knee x-ray from 12/18/2020.  My interpretation: No significant cortical change with spacing maintained in lateral and medial joint spaces.  Cortical irregularities seen on patella from lateral view, could be better interpreted with sunrise views if repeat imaging is done at a future date.  No acute fracture.   All pertinent previous records reviewed prior to and during visit.    Follow Up: As needed for pain flares.  Could consider CSI versus formal physical therapy versus NSAID course at that time   Subjective:    Chief Complaint: Bilat knee pain  HPI: Pt is a 65 y/o male c/o bilat knee pain, L>R. Pt notes knees have bothered him for years intermittently. Pt locates pain to the anterior aspect of bilat knees. Pt reports the pain is to the point where it's waking him up at night when it is flared.  Currently says that pain is tolerable on a day-to-day basis and is not limiting him from any activities.  Denies falls or trauma.  Denies numbness/tingling/weakness in extremities.  Swelling: yes- slight sometimes Mechanical symptoms: yes Radiates: yes Aggravates: kneeling on knees Treatments tried: compression sleeve, ice, IBU, naproxen, Voltaren gel, tramadol  Dx imaging: 12/18/20 R& L knee XR  12/10/04 R & L knee  XR   Relevant Historical Information: None pertinent  Additional pertinent review of systems negative.   Current Outpatient Medications:    ALPRAZolam (XANAX) 0.25 MG tablet, TAKE 2 TABLETS(0.5 MG) BY MOUTH AT BEDTIME AS NEEDED FOR SLEEP, Disp: 60 tablet, Rfl: 3   Ascorbic Acid (VITAMIN C) 1000 MG tablet, Take 1,000 mg by mouth daily., Disp: , Rfl:    aspirin 81 MG tablet, Take 81 mg by mouth daily., Disp: , Rfl:    calcium carbonate (OS-CAL) 600 MG TABS, Take 1,200 mg by mouth daily., Disp: , Rfl:    Cholecalciferol (VITAMIN D3) 2000 UNITS TABS, Take 1,000 Units by mouth daily., Disp: , Rfl:    cyanocobalamin 500 MCG tablet, Take 1,000 mcg by mouth daily., Disp: , Rfl:    DILANTIN 100 MG ER capsule, TAKE 5 CAPSULES DAILY AS   DIRECTED, Disp: 450 capsule, Rfl: 1   ezetimibe (ZETIA) 10 MG tablet, Take 1 tablet (10 mg total) by mouth daily., Disp: 90 tablet, Rfl: 1   fexofenadine (ALLEGRA) 180 MG tablet, Take 180 mg by mouth daily., Disp: , Rfl:    fluocinonide gel (LIDEX) AB-123456789 %, Apply 1 application topically 3 (three) times daily as needed. , Disp: , Rfl:    Melatonin 10 MG TABS, Take by mouth., Disp: , Rfl:    naproxen (NAPROSYN) 500 MG tablet, Take 1 tablet (500 mg total) by mouth daily as needed for moderate pain., Disp: 30 tablet, Rfl: 3   Omega-3 Fatty Acids (FISH OIL) 300 MG CAPS, Take by mouth. (Mega Red), Disp: , Rfl:    Probiotic Product (PROBIOTIC  ADVANCED PO), Take by mouth., Disp: , Rfl:    rosuvastatin (CRESTOR) 10 MG tablet, TAKE 1 TABLET DAILY, Disp: 90 tablet, Rfl: 1   traMADol (ULTRAM) 50 MG tablet, Take 1 tablet (50 mg total) by mouth every 8 (eight) hours as needed for up to 5 days., Disp: 15 tablet, Rfl: 0   TURMERIC PO, Take by mouth., Disp: , Rfl:    Objective:     There were no vitals filed for this visit.    There is no height or weight on file to calculate BMI.    Physical Exam:    General:  awake, alert oriented, no acute distress nontoxic Skin: no  suspicious lesions or rashes Neuro:sensation intact, no deficits, strength 5/5 with no deficits, no atrophy, normal muscle tone Psych: No signs of anxiety, depression or other mood disorder  Bilateral knee: No swelling or deformity Neg fluid wave, joint milking ROM Flex 90, Ext 0 NTTP over the quad tendon, medial fem condyle, lat fem condyle, patella, plica, patella tendon, tibial tuberostiy, fibular head, posterior fossa, pes anserine bursa, gerdy's tubercle, medial jt line, lateral jt line Neg anterior and posterior drawer Neg lachman Neg sag sign Negative varus stress Negative valgus stress Negative McMurray Negative Thessaly Positive patellar grind bilaterally  Gait normal    Electronically signed by:  Benito Mccreedy D.[facility] 10:44 AM 12/20/20

## 2020-12-27 ENCOUNTER — Other Ambulatory Visit: Payer: Self-pay

## 2020-12-27 ENCOUNTER — Ambulatory Visit (INDEPENDENT_AMBULATORY_CARE_PROVIDER_SITE_OTHER): Payer: Medicare Other | Admitting: Sports Medicine

## 2020-12-27 ENCOUNTER — Encounter: Payer: Self-pay | Admitting: Sports Medicine

## 2020-12-27 VITALS — BP 120/80 | HR 62 | Ht 68.0 in | Wt 203.0 lb

## 2020-12-27 DIAGNOSIS — G8929 Other chronic pain: Secondary | ICD-10-CM

## 2020-12-27 DIAGNOSIS — M170 Bilateral primary osteoarthritis of knee: Secondary | ICD-10-CM

## 2020-12-27 DIAGNOSIS — M25562 Pain in left knee: Secondary | ICD-10-CM | POA: Diagnosis not present

## 2020-12-27 DIAGNOSIS — M25561 Pain in right knee: Secondary | ICD-10-CM | POA: Diagnosis not present

## 2020-12-27 NOTE — Patient Instructions (Addendum)
Good to see you  Injections given in both knees today  Continue home exercises that were provided at last visit See me again when you need me

## 2020-12-27 NOTE — Progress Notes (Addendum)
Kenneth [name] D.[place] Phone: [phone]   Assessment and Plan:     1. Chronic pain of both knees 2. Osteoarthritis of patellofemoral joints of both knees -Acute flare of chronic issue, subsequent visit - Acute worsening of bilateral knee pain likely patellofemoral OA exacerbated by patient's activity - Bilateral CSI per procedure note below.  This is patient's first knee CSI - Continue HEP focused on patellar tracking - Continue Tylenol/NSAIDs as needed for pain control  Procedure: Knee Joint Injection Side: Bilateral Indication: Acute flare of chronic bilateral knee pain, patellofemoral OA bilaterally  Risks explained and consent was given verbally. The site was cleaned with alcohol  prep. A needle was introduced with an anterio-lateral approach. Injection given using 52m of 1% lidocaine without epinephrine and 16mof kenalog '40mg'$ /ml. This was well tolerated and resulted in symptomatic relief.  Needle was removed, hemostasis achieved, and post injection instructions were explained.  Identical procedure was repeated for opposite knee.  Pt was advised to call or return to clinic if these symptoms worsen or fail to improve as anticipated.    All pertinent previous records reviewed prior to and during visit.    Follow Up: Follow-up as needed if no improvement or worsening of symptoms.  Could consider advanced imaging versus formal PT at that time   Subjective:   I, Kenneth [name] serving as a scribe for Dr. BeGlennon MacChief Complaint: Bilateral knee pain   HPI:   12/27/20 Patient states Last night at 3 am woke up with sharp/aching pain and knees were hot to the touch. Patient used ice packs to help with the pain to fall asleep. Patient states he wasn't any more active than usual, but states that he needs to do something different to help with the pain so he can get back to his regular quality  of life.   12/20/2020 HPI: Pt is a [age]/o male c/o bilat knee pain, L>R. Pt notes knees have bothered him for years intermittently. Pt locates pain to the anterior aspect of bilat knees. Pt reports the pain is to the point where it's waking him up at night when it is flared.  Currently says that pain is tolerable on a day-to-day basis and is not limiting him from any activities.  Denies falls or trauma.  Denies numbness/tingling/weakness in extremities  Relevant Historical Information: none  Additional pertinent review of systems negative.   Current Outpatient Medications:    ALPRAZolam (XANAX) 0.25 MG tablet, TAKE 2 TABLETS(0.5 MG) BY MOUTH AT BEDTIME AS NEEDED FOR SLEEP, Disp: 60 tablet, Rfl: 3   Ascorbic Acid (VITAMIN C) 1000 MG tablet, Take 1,000 mg by mouth daily., Disp: , Rfl:    aspirin 81 MG tablet, Take 81 mg by mouth daily., Disp: , Rfl:    calcium carbonate (OS-CAL) 600 MG TABS, Take 1,200 mg by mouth daily., Disp: , Rfl:    Cholecalciferol (VITAMIN D3) 2000 UNITS TABS, Take 1,000 Units by mouth daily., Disp: , Rfl:    cyanocobalamin 500 MCG tablet, Take 1,000 mcg by mouth daily., Disp: , Rfl:    DILANTIN 100 MG ER capsule, TAKE 5 CAPSULES DAILY AS   DIRECTED, Disp: 450 capsule, Rfl: 1   ezetimibe (ZETIA) 10 MG tablet, Take 1 tablet (10 mg total) by mouth daily., Disp: 90 tablet, Rfl: 1   fexofenadine (ALLEGRA) 180 MG tablet, Take 180 mg by mouth daily., Disp: , Rfl:  fluocinonide gel (LIDEX) [id number] %, Apply 1 application topically 3 (three) times daily as needed. , Disp: , Rfl:    Melatonin 10 MG TABS, Take by mouth., Disp: , Rfl:    naproxen (NAPROSYN) 500 MG tablet, Take 1 tablet (500 mg total) by mouth daily as needed for moderate pain., Disp: 30 tablet, Rfl: 3   Omega-3 Fatty Acids (FISH OIL) 300 MG CAPS, Take by mouth. (Mega Red), Disp: , Rfl:    Probiotic Product (PROBIOTIC ADVANCED PO), Take by mouth., Disp: , Rfl:    rosuvastatin (CRESTOR) 10 MG tablet, TAKE 1 TABLET  DAILY, Disp: 90 tablet, Rfl: 1   TURMERIC PO, Take by mouth., Disp: , Rfl:    Objective:     Vitals:   12/27/20 1129  BP: 120/80  Pulse: 62  SpO2: 97%  Weight: 203 lb (92.1 kg)  Height: '5\' 8"'$  (1.727 m)      Body mass index is 30.87 kg/m.    Physical Exam:    General:  awake, alert oriented, no acute distress nontoxic Skin: no suspicious lesions or rashes Neuro:sensation intact, no deficits, strength 5/5 with no deficits, no atrophy, normal muscle tone Psych: No signs of anxiety, depression or other mood disorder  Bilateral knee: No swelling or deformity Neg fluid wave, joint milking ROM Flex 90, Ext 0 NTTP over the quad tendon, medial fem condyle, lat fem condyle, patella, plica, patella tendon, tibial tuberostiy, fibular head, posterior fossa, pes anserine bursa, gerdy's tubercle, medial jt line, lateral jt line Neg anterior and posterior drawer Neg lachman Neg sag sign Negative varus stress Negative valgus stress Negative McMurray Negative Thessaly Positive patellar grind bilaterally   Gait normal   Electronically signed by:  Kenneth [name] D.[facility] 11:59 AM 12/27/20

## 2020-12-29 ENCOUNTER — Telehealth: Payer: Self-pay | Admitting: Sports Medicine

## 2020-12-29 NOTE — Telephone Encounter (Signed)
Called patient and relayed doctors explanation below patient stated understanding

## 2020-12-29 NOTE — Telephone Encounter (Signed)
Pt recently seen for knees. Has already purchased a supplement called Move Free and wondered if Dr. Glennon Mac thinks this would be helpful for his mobility.

## 2021-01-10 ENCOUNTER — Ambulatory Visit (INDEPENDENT_AMBULATORY_CARE_PROVIDER_SITE_OTHER): Payer: Medicare Other | Admitting: Sports Medicine

## 2021-01-10 ENCOUNTER — Other Ambulatory Visit: Payer: Self-pay

## 2021-01-10 VITALS — BP 118/80 | HR 53 | Ht 68.0 in | Wt 203.0 lb

## 2021-01-10 DIAGNOSIS — M25562 Pain in left knee: Secondary | ICD-10-CM

## 2021-01-10 DIAGNOSIS — G8929 Other chronic pain: Secondary | ICD-10-CM | POA: Diagnosis not present

## 2021-01-10 DIAGNOSIS — M25561 Pain in right knee: Secondary | ICD-10-CM

## 2021-01-10 NOTE — Patient Instructions (Addendum)
Good to see you  May use Voltaren gel over tender area Use cushioning when kneeling Glad you are doing better  See me again as needed

## 2021-01-10 NOTE — Progress Notes (Signed)
Kenneth [name] D.[place] Kenneth [name] Phone: [phone]   Assessment and Plan:     1. Chronic pain of both knees -Acute on chronic, improving, subsequent sports medicine visit - Moderate improvement in bilateral knee pain after CSI at previous office visit - Continues to have 1 area of pinpoint tenderness likely from patellofemoral contusion - Use Voltaren gel over areas of tenderness - Continue to use cushioning when kneeling - Naproxen/ibuprofen as needed for pain control    Pertinent previous records reviewed include previous office visit note   Follow Up: As needed if no improvement or worsening of symptoms   Subjective:   I, Kenneth [name], am serving as a Education administrator for Kenneth [name]  Chief Complaint: follow up Left knee pain   HPI: 65 year old male presenting with Left knee pain   12/20/2020 HPI: Pt is a 65 y/o male c/o bilat knee pain, L>R. Pt notes knees have bothered him for years intermittently. Pt locates pain to the anterior aspect of bilat knees. Pt reports the pain is to the point where it's waking him up at night when it is flared.  Currently says that pain is tolerable on a day-to-day basis and is not limiting him from any activities.  Denies falls or trauma.  Denies numbness/tingling/weakness in extremities  12/27/20 Patient states Last night at 3 am woke up with sharp/aching pain and knees were hot to the touch. Patient used ice packs to help with the pain to fall asleep. Patient states he wasn't any more active than usual, but states that he needs to do something different to help with the pain so he can get back to his regular quality of life.   01/10/21 Patient states that mostly feeling better, still has some popping and cracking in the morning. Patient states that he still is having a tender spot on the top of his knee cap that when it hits something it is very tender. Patient states he had  doen a lot of steps recently and he is still doing well.     Additional pertinent review of systems negative.   Current Outpatient Medications:    ALPRAZolam (XANAX) 0.25 MG tablet, TAKE 2 TABLETS(0.5 MG) BY MOUTH AT BEDTIME AS NEEDED FOR SLEEP, Disp: 60 tablet, Rfl: 3   Ascorbic Acid (VITAMIN C) 1000 MG tablet, Take 1,000 mg by mouth daily., Disp: , Rfl:    aspirin 81 MG tablet, Take 81 mg by mouth daily., Disp: , Rfl:    calcium carbonate (OS-CAL) 600 MG TABS, Take 1,200 mg by mouth daily., Disp: , Rfl:    Cholecalciferol (VITAMIN D3) 2000 UNITS TABS, Take 1,000 Units by mouth daily., Disp: , Rfl:    cyanocobalamin 500 MCG tablet, Take 1,000 mcg by mouth daily., Disp: , Rfl:    DILANTIN 100 MG ER capsule, TAKE 5 CAPSULES DAILY AS   DIRECTED, Disp: 450 capsule, Rfl: 1   ezetimibe (ZETIA) 10 MG tablet, Take 1 tablet (10 mg total) by mouth daily., Disp: 90 tablet, Rfl: 1   fexofenadine (ALLEGRA) 180 MG tablet, Take 180 mg by mouth daily., Disp: , Rfl:    fluocinonide gel (LIDEX) 5.63 %, Apply 1 application topically 3 (three) times daily as needed. , Disp: , Rfl:    Melatonin 10 MG TABS, Take by mouth., Disp: , Rfl:    naproxen (NAPROSYN) 500 MG tablet, Take 1 tablet (500 mg total) by mouth daily as needed for  moderate pain., Disp: 30 tablet, Rfl: 3   Omega-3 Fatty Acids (FISH OIL) 300 MG CAPS, Take by mouth. (Mega Red), Disp: , Rfl:    Probiotic Product (PROBIOTIC ADVANCED PO), Take by mouth., Disp: , Rfl:    rosuvastatin (CRESTOR) 10 MG tablet, TAKE 1 TABLET DAILY, Disp: 90 tablet, Rfl: 1   TURMERIC PO, Take by mouth., Disp: , Rfl:    Objective:     Vitals:   01/10/21 1101  BP: 118/80  Pulse: (!) 53  SpO2: 97%  Weight: 203 lb (92.1 kg)  Height: 5\' 8"  (1.727 m)      Body mass index is 30.87 kg/m.    Physical Exam:    General:  awake, alert oriented, no acute distress nontoxic Skin: no suspicious lesions or rashes Neuro:sensation intact, no deficits, strength 5/5 with no  deficits, no atrophy, normal muscle tone Psych: No signs of anxiety, depression or other mood disorder  Bilateral knee: No swelling No deformity Neg fluid wave, joint milking ROM Flex 110, Ext 0 TTP at distal lateral femoral condyle over anterior surface NTTP over the quad tendon, medial fem condyle, lat fem condyle, patella, plica, patella tendon, tibial tuberostiy, fibular head, posterior fossa, pes anserine bursa, gerdy's tubercle, medial jt line, lateral jt line Neg anterior and posterior drawer Neg lachman Neg sag sign Negative varus stress Negative valgus stress Negative McMurray  Gait normal    Electronically signed by:  Kenneth [name] D.[name] 11:27 AM 01/10/21

## 2021-01-16 ENCOUNTER — Encounter: Payer: Medicare Other | Admitting: Internal Medicine

## 2021-01-24 ENCOUNTER — Other Ambulatory Visit: Payer: Self-pay

## 2021-01-24 ENCOUNTER — Encounter: Payer: Self-pay | Admitting: Internal Medicine

## 2021-01-24 ENCOUNTER — Ambulatory Visit (INDEPENDENT_AMBULATORY_CARE_PROVIDER_SITE_OTHER): Payer: Medicare Other | Admitting: Internal Medicine

## 2021-01-24 VITALS — BP 128/78 | HR 62 | Temp 97.9°F | Resp 16 | Ht 68.0 in | Wt 203.5 lb

## 2021-01-24 DIAGNOSIS — Z125 Encounter for screening for malignant neoplasm of prostate: Secondary | ICD-10-CM | POA: Diagnosis not present

## 2021-01-24 DIAGNOSIS — Z23 Encounter for immunization: Secondary | ICD-10-CM

## 2021-01-24 DIAGNOSIS — R739 Hyperglycemia, unspecified: Secondary | ICD-10-CM

## 2021-01-24 DIAGNOSIS — K760 Fatty (change of) liver, not elsewhere classified: Secondary | ICD-10-CM

## 2021-01-24 DIAGNOSIS — Z0000 Encounter for general adult medical examination without abnormal findings: Secondary | ICD-10-CM | POA: Diagnosis not present

## 2021-01-24 DIAGNOSIS — E785 Hyperlipidemia, unspecified: Secondary | ICD-10-CM

## 2021-01-24 DIAGNOSIS — R55 Syncope and collapse: Secondary | ICD-10-CM

## 2021-01-24 LAB — COMPREHENSIVE METABOLIC PANEL
ALT: 105 U/L — ABNORMAL HIGH (ref 0–53)
AST: 59 U/L — ABNORMAL HIGH (ref 0–37)
Albumin: 4.4 g/dL (ref 3.5–5.2)
Alkaline Phosphatase: 77 U/L (ref 39–117)
BUN: 14 mg/dL (ref 6–23)
CO2: 27 mEq/L (ref 19–32)
Calcium: 9.4 mg/dL (ref 8.4–10.5)
Chloride: 101 mEq/L (ref 96–112)
Creatinine, Ser: 0.85 mg/dL (ref 0.40–1.50)
GFR: 90.04 mL/min (ref >60.00)
Glucose, Bld: 95 mg/dL (ref 70–99)
Potassium: 4.2 mEq/L (ref 3.5–5.1)
Sodium: 135 mEq/L (ref 135–145)
Total Bilirubin: 0.7 mg/dL (ref 0.2–1.2)
Total Protein: 6.6 g/dL (ref 6.0–8.3)

## 2021-01-24 LAB — CBC WITH DIFFERENTIAL/PLATELET
Basophils Absolute: 0 10*3/uL (ref 0.0–0.1)
Basophils Relative: 0.5 % (ref 0.0–3.0)
Eosinophils Absolute: 0.1 10*3/uL (ref 0.0–0.7)
Eosinophils Relative: 2.7 % (ref 0.0–5.0)
HCT: 46.6 % (ref 39.0–52.0)
Hemoglobin: 16 g/dL (ref 13.0–17.0)
Lymphocytes Relative: 23.1 % (ref 12.0–46.0)
Lymphs Abs: 1 10*3/uL (ref 0.7–4.0)
MCHC: 34.5 g/dL (ref 30.0–36.0)
MCV: 101.1 fl — ABNORMAL HIGH (ref 78.0–100.0)
Monocytes Absolute: 0.5 10*3/uL (ref 0.1–1.0)
Monocytes Relative: 12 % (ref 3.0–12.0)
Neutro Abs: 2.7 10*3/uL (ref 1.4–7.7)
Neutrophils Relative %: 61.7 % (ref 43.0–77.0)
Platelets: 212 10*3/uL (ref 150.0–400.0)
RBC: 4.6 Mil/uL (ref 4.22–5.81)
RDW: 12.7 % (ref 11.5–15.5)
WBC: 4.4 10*3/uL (ref 4.0–10.5)

## 2021-01-24 LAB — LIPID PANEL
Cholesterol: 194 mg/dL (ref 0–200)
HDL: 81.9 mg/dL (ref >39.00)
LDL Cholesterol: 85 mg/dL (ref 0–99)
NonHDL: 112.07
Total CHOL/HDL Ratio: 2
Triglycerides: 133 mg/dL (ref 0.0–149.0)
VLDL: 26.6 mg/dL (ref 0.0–40.0)

## 2021-01-24 LAB — TSH: TSH: 1.79 u[IU]/mL (ref 0.35–5.50)

## 2021-01-24 LAB — PSA: PSA: 1.29 ng/mL (ref 0.10–4.00)

## 2021-01-24 NOTE — Progress Notes (Signed)
Subjective:    Patient ID: Kenneth [name]., male    DOB: [birth year], 65 y.o.   MRN: [id number]  DOS:  01/24/2021 Type of visit - description: CPX  In general feels well. He does have some knee issues, follow-up elsewhere.  Review of Systems  Other than above, a 14 point review of systems is negative    Past Medical History:  Diagnosis Date   Allergy    Hx of colonic polyp 2007   Hyperlipidemia    Insomnia 06/02/2014   Syncope    in 20s; no etiology, no recurrence. 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  Tobacco Use   Smoking status: Never   Smokeless tobacco: Never  Substance and Sexual Activity   Alcohol use: Yes    Alcohol/week: 6.0 - 8.0 standard drinks    Types: 6 - 8 Cans of beer per week    Comment: 1-2 beers qd    Drug use: No   Sexual activity: Not on file  Other Topics Concern   Not on file  Social History Narrative   Household pt and wife   Social Determinants of Health   Financial Resource Strain: Not on file  Food Insecurity: Not on file  Transportation Needs: Not on file  Physical Activity: Not on file  Stress: Not on file  Social Connections: Not on file  Intimate Partner Violence: Not on file     Allergies as of 01/24/2021   No Known Allergies      Medication List        Accurate as of January 24, 2021 11:59 PM. If you have any questions, ask your nurse or doctor.          STOP taking these medications     aspirin 81 MG tablet Stopped by: Kathlene November, MD       TAKE these medications    ALPRAZolam 0.25 MG tablet Commonly known as: XANAX TAKE 2 TABLETS(0.5 MG) BY MOUTH AT BEDTIME AS NEEDED FOR SLEEP   calcium carbonate 600 MG Tabs tablet Commonly known as: OS-CAL Take 1,200 mg by mouth daily.   CHONDROITIN SULFATE PO Take by mouth.   Dilantin 100 MG ER capsule Generic drug: phenytoin TAKE 5 CAPSULES DAILY AS   DIRECTED   ezetimibe 10 MG tablet Commonly known as: ZETIA Take 1 tablet (10 mg total) by mouth daily.   fexofenadine 180 MG tablet Commonly known as: ALLEGRA Take 180 mg by mouth daily.   Fish Oil 300 MG Caps Take by mouth. (Mega Red)   fluocinonide gel 0.05 % Commonly known as: LIDEX Apply 1 application topically 3 (three) times daily as needed.   Melatonin 10 MG Tabs Take by mouth.   naproxen 500 MG tablet Commonly known as: NAPROSYN Take 1 tablet (500 mg total) by mouth daily as needed for moderate pain.  PROBIOTIC ADVANCED PO Take by mouth.   rosuvastatin 10 MG tablet Commonly known as: CRESTOR TAKE 1 TABLET DAILY   TURMERIC PO Take by mouth.   vitamin B-12 500 MCG tablet Commonly known as: CYANOCOBALAMIN Take 1,000 mcg by mouth daily.   vitamin C 1000 MG tablet Take 1,000 mg by mouth daily.   Vitamin D3 50 MCG (2000 UT) Tabs Take 1,000 Units by mouth daily.   Voltaren 1 % Gel Generic drug: diclofenac Sodium Apply 2 g topically 4 (four) times daily.           Objective:   Physical Exam BP 128/78 (BP Location: Left Arm, Patient Position: Sitting, Cuff Size: Small)   Pulse 62   Temp 97.9 F (36.6 C) (Oral)   Resp 16   Ht 5\' 8"  (1.727 m)   Wt 203 lb 8 oz (92.3 kg)   SpO2 98%   BMI 30.94 kg/m  General: Well developed, NAD, BMI noted Neck: No  thyromegaly  HEENT:  Normocephalic . [name] symmetric, atraumatic Lungs:  CTA B Normal respiratory effort, no intercostal retractions, no accessory muscle use. Heart: RRR,  no  murmur.  Abdomen:  Not distended, soft, non-tender. No rebound or rigidity.  No organomegaly DRE: Normal sphincter tone, no stools, prostate normal Lower extremities: no pretibial edema bilaterally  Skin: Exposed areas without rash. Not pale. Not jaundice Neurologic:  alert & oriented X3.  Speech normal, gait appropriate for age and unassisted Strength symmetric and appropriate for age.  Psych: Cognition and judgment appear intact.  Cooperative with normal attention span and concentration.  Behavior appropriate. No anxious or depressed appearing.     Assessment      Assessment Hyperlipidemia Insomnia-- xanax prn, rx per pcp Allergies Syncope, in the 1980s, on Dilantin since then, used to see Dr. Erling Cruz, reluctant to stop dilantin Elevated LFTs:  on-off x years, hepatitis B C -2008, iron, ferritin, alpha-1 antitrypsin normal 2016. CT 4-/2018: Hepatic steatosis.  SPEP 2019 (-) Incomplete RBBB-previously a stress test was negative Osteopenia?   DEXA 07/18/2016 normal Covid infection 05-2020   PLAN: Here for CPX Aspirin: Okay to stop it. Hyperlipidemia: Continue Crestor and Zetia Insomnia: controlled . Syncope h/o: On phenytoin, checking levels. Elevated LFTs, hepatic asteatosis: No organomegaly on exam, checking labs, also recommend a liver US. EtOH: Drinks "2 beers at night", new guidelines recommend to limit drinks to 1 at night, patient aware and encouraged to moderate. RTC 1 year  This visit occurred during the SARS-CoV-2 public health emergency.  Safety protocols were in place, including screening questions prior to the visit, additional usage of staff PPE, and extensive cleaning of exam room while observing appropriate contact time as indicated for disinfecting solutions.

## 2021-01-24 NOTE — Patient Instructions (Addendum)
Please to stop aspirin  Proceed with a COVID-vaccine booster   GO TO THE LAB : Get the blood work     [place], [place] Come back for a physical exam in 1 year   STOP BY THE FIRST FLOOR: Schedule and liver ultrasound

## 2021-01-25 ENCOUNTER — Encounter: Payer: Self-pay | Admitting: Internal Medicine

## 2021-01-25 LAB — PHENYTOIN LEVEL, TOTAL: Phenytoin, Total: 15.2 mg/L (ref 10.0–20.0)

## 2021-01-25 NOTE — Assessment & Plan Note (Signed)
-  Td  06-2016 - prevnar:  11/19/2018 - pnm 23: 2021 - s/p shingrex - had covid x3, booster recommended - rec flu shot  today -CCS: Last colonoscopy 2013, neg ---- next 2023 - Prostate cancer screening: No symptoms, DRE normal, check PSA.   -labs: CMP, FLP, CBC, phenytoin level, TSH, PSA -Diet and exercise discussed

## 2021-01-25 NOTE — Assessment & Plan Note (Signed)
Here for CPX Aspirin: Okay to stop it. Hyperlipidemia: Continue Crestor and Zetia Insomnia: controlled . Syncope h/o: On phenytoin, checking levels. Elevated LFTs, hepatic asteatosis: No organomegaly on exam, checking labs, also recommend a liver US. EtOH: Drinks "2 beers at night", new guidelines recommend to limit drinks to 1 at night, patient aware and encouraged to moderate. RTC 1 year

## 2021-01-31 ENCOUNTER — Other Ambulatory Visit: Payer: Self-pay

## 2021-01-31 ENCOUNTER — Ambulatory Visit (HOSPITAL_BASED_OUTPATIENT_CLINIC_OR_DEPARTMENT_OTHER)
Admission: RE | Admit: 2021-01-31 | Discharge: 2021-01-31 | Disposition: A | Discharge status: Home / Self Care | Payer: Medicare Other | Source: Ambulatory Visit | Attending: Internal Medicine | Admitting: Internal Medicine

## 2021-01-31 DIAGNOSIS — K760 Fatty (change of) liver, not elsewhere classified: Secondary | ICD-10-CM | POA: Diagnosis not present

## 2021-02-06 ENCOUNTER — Other Ambulatory Visit: Payer: Self-pay

## 2021-02-06 MED ORDER — EZETIMIBE 10 MG PO TABS: 10.0000 mg | ORAL_TABLET | Freq: Every day | ORAL | 1 refills | Status: DC

## 2021-03-29 ENCOUNTER — Other Ambulatory Visit: Payer: Self-pay | Admitting: Internal Medicine

## 2021-05-12 ENCOUNTER — Other Ambulatory Visit: Payer: Self-pay | Admitting: Internal Medicine

## 2021-05-20 ENCOUNTER — Telehealth: Payer: Self-pay | Admitting: Internal Medicine

## 2021-05-21 ENCOUNTER — Ambulatory Visit (INDEPENDENT_AMBULATORY_CARE_PROVIDER_SITE_OTHER): Payer: Medicare Other | Admitting: Sports Medicine

## 2021-05-21 ENCOUNTER — Other Ambulatory Visit: Payer: Self-pay

## 2021-05-21 VITALS — BP 120/80 | HR 58 | Ht 68.0 in | Wt 208.0 lb

## 2021-05-21 DIAGNOSIS — M170 Bilateral primary osteoarthritis of knee: Secondary | ICD-10-CM | POA: Diagnosis not present

## 2021-05-21 DIAGNOSIS — G8929 Other chronic pain: Secondary | ICD-10-CM

## 2021-05-21 DIAGNOSIS — M25561 Pain in right knee: Secondary | ICD-10-CM

## 2021-05-21 DIAGNOSIS — M25562 Pain in left knee: Secondary | ICD-10-CM | POA: Diagnosis not present

## 2021-05-21 NOTE — Progress Notes (Signed)
Kenneth [name] D.[facility] Phone: [phone]   Assessment and Plan:     1. Osteoarthritis of patellofemoral joints of both knees 2. Chronic pain of both knees -Chronic with exacerbation, subsequent visit - Recurrence of chronic bilateral knee pain.  Patient has received several months of moderate relief from previous CSI and elects for repeat CSI today.  Tolerated well per note below - Can return to clinic in 3 months for repeat CSI if needed.  If relief does not last 3 months, patient may ask for follow-up sooner and we can consider HA injections   Procedure: Knee Joint Injection Side: Bilateral Indication: Recurrent flare of osteoarthritis  Risks explained and consent was given verbally. The site was cleaned with alcohol prep. A needle was introduced with an anterio-lateral approach. Injection given using 62mL of 1% lidocaine without epinephrine and 29mL of kenalog 40mg /ml. This was well tolerated and resulted in symptomatic relief.  Needle was removed, hemostasis achieved, and post injection instructions were explained.  Procedure was repeated on contralateral side.  Pt was advised to call or return to clinic if these symptoms worsen or fail to improve as anticipated.   Pertinent previous records reviewed include none   Follow Up: Can return to clinic in 3 months for repeat CSI if needed.  If relief does not last 3 months, patient may ask for follow-up sooner and we can consider HA injections     Subjective:   I, Kenneth [name], am serving as a Education administrator for [name]  Chief Complaint: bilateral knee pain   HPI:   Pt is a 66 y/o male c/o bilat knee pain, L>R. Pt notes knees have bothered him for years intermittently. Pt locates pain to the anterior aspect of bilat knees. Pt reports the pain is to the point where it's waking him up at night when it is flared.  Currently says that pain is tolerable  on a day-to-day basis and is not limiting him from any activities.  Denies falls or trauma.  Denies numbness/tingling/weakness in extremities   12/27/20 Patient states Last night at 3 am woke up with sharp/aching pain and knees were hot to the touch. Patient used ice packs to help with the pain to fall asleep. Patient states he wasn't any more active than usual, but states that he needs to do something different to help with the pain so he can get back to his regular quality of life.    01/10/21 Patient states that mostly feeling better, still has some popping and cracking in the morning. Patient states that he still is having a tender spot on the top of his knee cap that when it hits something it is very tender. Patient states he had doen a lot of steps recently and he is still doing well.   05/21/21 Patient states that the knee are aching, kneecaps aren't bothering like they were just wants bilateral injections  Relevant Historical Information: None pertinent  Additional pertinent review of systems negative.   Current Outpatient Medications:    ALPRAZolam (XANAX) 0.25 MG tablet, TAKE 2 TABLETS(0.5 MG) BY MOUTH AT BEDTIME AS NEEDED FOR SLEEP, Disp: 60 tablet, Rfl: 5   Ascorbic Acid (VITAMIN C) 1000 MG tablet, Take 1,000 mg by mouth daily., Disp: , Rfl:    calcium carbonate (OS-CAL) 600 MG TABS, Take 1,200 mg by mouth daily., Disp: , Rfl:    Cholecalciferol (VITAMIN D3) 2000 UNITS TABS, Take 1,000  Units by mouth daily., Disp: , Rfl:    CHONDROITIN SULFATE PO, Take by mouth., Disp: , Rfl:    cyanocobalamin 500 MCG tablet, Take 1,000 mcg by mouth daily., Disp: , Rfl:    diclofenac Sodium (VOLTAREN) 1 % GEL, Apply 2 g topically 4 (four) times daily., Disp: , Rfl:    DILANTIN 100 MG ER capsule, TAKE 5 CAPSULES DAILY AS   DIRECTED, Disp: 450 capsule, Rfl: 2   ezetimibe (ZETIA) 10 MG tablet, Take 1 tablet (10 mg total) by mouth daily., Disp: 90 tablet, Rfl: 1   fexofenadine (ALLEGRA) 180 MG  tablet, Take 180 mg by mouth daily., Disp: , Rfl:    fluocinonide gel (LIDEX) 3.78 %, Apply 1 application topically 3 (three) times daily as needed. , Disp: , Rfl:    Melatonin 10 MG TABS, Take by mouth., Disp: , Rfl:    naproxen (NAPROSYN) 500 MG tablet, TAKE 1 TABLET(500 MG) BY MOUTH DAILY AS NEEDED FOR MODERATE PAIN, Disp: 30 tablet, Rfl: 3   Omega-3 Fatty Acids (FISH OIL) 300 MG CAPS, Take by mouth. (Mega Red), Disp: , Rfl:    Probiotic Product (PROBIOTIC ADVANCED PO), Take by mouth., Disp: , Rfl:    rosuvastatin (CRESTOR) 10 MG tablet, TAKE 1 TABLET DAILY, Disp: 90 tablet, Rfl: 2   TURMERIC PO, Take by mouth., Disp: , Rfl:    Objective:     Vitals:   05/21/21 1308  BP: 120/80  Pulse: (!) 58  SpO2: 97%  Weight: 208 lb (94.3 kg)  Height: 5\' 8"  (1.727 m)      Body mass index is 31.63 kg/m.    Physical Exam:    General:  awake, alert oriented, no acute distress nontoxic Skin: no suspicious lesions or rashes Neuro:sensation intact, no deficits, strength 5/5 with no deficits, no atrophy, normal muscle tone Psych: No signs of anxiety, depression or other mood disorder  Knee: No swelling No deformity Neg fluid wave, joint milking ROM Flex 100, Ext 5 TTP medial and lateral joint line NTTP over the quad tendon, medial fem condyle, lat fem condyle, patella, plica, patella tendon, tibial tuberostiy, fibular head, posterior fossa, pes anserine bursa, gerdy's tubercle,   Neg anterior and posterior drawer Neg lachman Neg sag sign Negative varus stress Negative valgus stress Negative [place]    Gait normal    Electronically signed by:  Kenneth [name] D.[facility] 1:26 PM 05/21/21

## 2021-05-21 NOTE — Telephone Encounter (Signed)
PDMP okay, Rx sent 

## 2021-05-21 NOTE — Patient Instructions (Addendum)
Good to see you  ?As needed follow up  ?

## 2021-05-21 NOTE — Telephone Encounter (Signed)
Requesting: alprazolam 0.25mg   Contract: 07/12/2020 UDS: None d/t $$$ Last Visit: 01/24/2021 Next Visit: 01/30/2022 Last Refill: 10/09/2020 #60 and 3RF  Please Advise

## 2021-06-12 ENCOUNTER — Ambulatory Visit (INDEPENDENT_AMBULATORY_CARE_PROVIDER_SITE_OTHER): Payer: Medicare Other | Admitting: Family Medicine

## 2021-06-12 ENCOUNTER — Encounter: Payer: Self-pay | Admitting: Family Medicine

## 2021-06-12 VITALS — BP 135/78 | HR 76 | Temp 100.1°F | Resp 16 | Ht 70.0 in | Wt 207.4 lb

## 2021-06-12 DIAGNOSIS — J101 Influenza due to other identified influenza virus with other respiratory manifestations: Secondary | ICD-10-CM

## 2021-06-12 LAB — POCT INFLUENZA A/B
Influenza A, POC: POSITIVE — AB
Influenza B, POC: NEGATIVE

## 2021-06-12 MED ORDER — OSELTAMIVIR PHOSPHATE 75 MG PO CAPS: 75.0000 mg | ORAL_CAPSULE | Freq: Two times a day (BID) | ORAL | 0 refills | Status: AC

## 2021-06-12 MED ORDER — AZITHROMYCIN 250 MG PO TABS: ORAL_TABLET | ORAL | 0 refills | Status: DC

## 2021-06-12 MED ORDER — PROMETHAZINE-DM 6.25-15 MG/5ML PO SYRP: 5.0000 mL | ORAL_SOLUTION | Freq: Four times a day (QID) | ORAL | 0 refills | Status: DC | PRN

## 2021-06-12 NOTE — Patient Instructions (Signed)
Continue to push fluids, practice good hand hygiene, and cover your mouth if you cough. ? ?If you start having fevers, shaking or shortness of breath, seek immediate care. ? ?OK to take Tylenol 1000 mg (2 extra strength tabs) or 975 mg (3 regular strength tabs) every 6 hours as needed. ? ?Let us know if you need anything. ?

## 2021-06-12 NOTE — Progress Notes (Signed)
Chief Complaint  Patient presents with   Nasal Congestion    Here for complains Congestion and upper respiratory onset 3 days     Kenneth [name]. here for URI complaints.  Duration: 2 days  Associated symptoms: Fever (100.5 F), sinus congestion, wheezing, shortness of breath, and coughing Denies: sinus pain, rhinorrhea, itchy watery eyes, ear pain, ear drainage, sore throat, myalgia, and N/V/D Treatment to date: ibuprofen, pushing fluids Sick contacts: No; Returned from [place] prior to [employer] test neg.   Past Medical History:  Diagnosis Date   Allergy    Hx of colonic polyp 2007   Hyperlipidemia    Insomnia 06/02/2014   Syncope    in 20s; no etiology, no recurrence. Dilantin Rxed    Objective BP 135/78 (BP Location: Right Arm, Patient Position: Sitting, Cuff Size: Normal)    Pulse 76    Temp 100.1 F (37.8 C) (Oral)    Resp 16    Ht 5\' 10"  (1.778 m)    Wt 207 lb 6.4 oz (94.1 kg)    SpO2 96%    BMI 29.76 kg/m  General: Awake, alert, appears stated age HEENT: AT, [place], ears patent b/l and TM's neg, nares patent w/o discharge, pharynx pink and without exudates, MMM Neck: No masses or asymmetry Heart: RRR Lungs: CTAB, no accessory muscle use Psych: Age appropriate judgment and insight, normal mood and affect  Influenza A - Plan: promethazine-dextromethorphan (PROMETHAZINE-DM) 6.25-15 MG/5ML syrup, POCT Influenza A/B, oseltamivir (TAMIFLU) 75 MG capsule  Continue to push fluids, practice good hand hygiene, cover mouth when coughing. +flu A test today.  F/u prn. If starting to experience worsening s/s's, shaking, or shortness of breath, seek immediate care. Pt voiced understanding and agreement to the plan.  [place], DO 06/12/21 10:59 AM

## 2021-06-15 ENCOUNTER — Encounter: Payer: Self-pay | Admitting: Family Medicine

## 2021-06-15 ENCOUNTER — Other Ambulatory Visit: Payer: Self-pay | Admitting: Family Medicine

## 2021-06-15 MED ORDER — ALBUTEROL SULFATE HFA 108 (90 BASE) MCG/ACT IN AERS: 1.0000 | INHALATION_SPRAY | Freq: Four times a day (QID) | RESPIRATORY_TRACT | 0 refills | Status: DC | PRN

## 2021-06-29 ENCOUNTER — Encounter: Payer: Self-pay | Admitting: Family Medicine

## 2021-07-26 ENCOUNTER — Telehealth: Payer: Self-pay | Admitting: Internal Medicine

## 2021-07-26 NOTE — Telephone Encounter (Signed)
Left message for patient to call back and schedule Medicare Annual Wellness Visit (AWV) either virtually or phone ? ?AWVI DUE 10/21/2019 PER [place]; please schedule at anytime with health coach ? ?This should be a 45 minute visit.  ? ?I left my direct # [phone] ?

## 2021-08-07 ENCOUNTER — Ambulatory Visit (INDEPENDENT_AMBULATORY_CARE_PROVIDER_SITE_OTHER): Payer: Medicare Other

## 2021-08-07 VITALS — Ht 70.0 in | Wt 207.0 lb

## 2021-08-07 DIAGNOSIS — Z0000 Encounter for general adult medical examination without abnormal findings: Secondary | ICD-10-CM | POA: Diagnosis not present

## 2021-08-07 NOTE — Patient Instructions (Addendum)
?Kenneth [name] , ?Thank you for taking time to come for your Medicare Wellness Visit. I appreciate your ongoing commitment to your health goals. Please review the following plan we discussed and let me know if I can assist you in the future.  ? ?These are the goals we discussed: ? Goals   ? ?   Increase physical activity (pt-stated)   ?   Maintain a healthy life. ?  ? ?  ?  ?This is a list of the screening recommended for you and due dates:  ?Health Maintenance  ?Topic Date Due  ? Colon Cancer Screening  11/11/2021  ? Flu Shot  11/20/2021  ? Tetanus Vaccine  07/19/2026  ? Pneumonia Vaccine  Completed  ? COVID-19 Vaccine  Completed  ? Hepatitis C Screening: USPSTF Recommendation to screen - Ages 70-79 yo.  Completed  ? Zoster (Shingles) Vaccine  Completed  ? HPV Vaccine  Aged Out  ? ?Advanced directives: Yes Patient will bring copy ? ?Conditions/risks identified: None ? ?Next appointment: Follow up in one year for your annual wellness visit.  ? ?Preventive Care 22 Years and Older, Male ?Preventive care refers to lifestyle choices and visits with your health care provider that can promote health and wellness. ?What does preventive care include? ?A yearly physical exam. This is also called an annual well check. ?Dental exams once or twice a year. ?Routine eye exams. Ask your health care provider how often you should have your eyes checked. ?Personal lifestyle choices, including: ?Daily care of your teeth and gums. ?Regular physical activity. ?Eating a healthy diet. ?Avoiding tobacco and drug use. ?Limiting alcohol use. ?Practicing safe sex. ?Taking low doses of aspirin every day. ?Taking vitamin and mineral supplements as recommended by your health care provider. ?What happens during an annual well check? ?The services and screenings done by your health care provider during your annual well check will depend on your age, overall health, lifestyle risk factors, and family history of disease. ?Counseling  ?Your health  care provider may ask you questions about your: ?Alcohol use. ?Tobacco use. ?Drug use. ?Emotional well-being. ?Home and relationship well-being. ?Sexual activity. ?Eating habits. ?History of falls. ?Memory and ability to understand (cognition). ?Work and work Statistician. ?Screening  ?You may have the following tests or measurements: ?Height, weight, and BMI. ?Blood pressure. ?Lipid and cholesterol levels. These may be checked every 5 years, or more frequently if you are over 28 years old. ?Skin check. ?Lung cancer screening. You may have this screening every year starting at age 33 if you have a 30-pack-year history of smoking and currently smoke or have quit within the past 15 years. ?Fecal occult blood test (FOBT) of the stool. You may have this test every year starting at age 32. ?Flexible sigmoidoscopy or colonoscopy. You may have a sigmoidoscopy every 5 years or a colonoscopy every 10 years starting at age 48. ?Prostate cancer screening. Recommendations will vary depending on your family history and other risks. ?Hepatitis C blood test. ?Hepatitis B blood test. ?Sexually transmitted disease (STD) testing. ?Diabetes screening. This is done by checking your blood sugar (glucose) after you have not eaten for a while (fasting). You may have this done every 1-3 years. ?Abdominal aortic aneurysm (AAA) screening. You may need this if you are a current or former smoker. ?Osteoporosis. You may be screened starting at age 55 if you are at high risk. ?Talk with your health care provider about your test results, treatment options, and if necessary, the need for more tests. ?  Vaccines  ?Your health care provider may recommend certain vaccines, such as: ?Influenza vaccine. This is recommended every year. ?Tetanus, diphtheria, and acellular pertussis (Tdap, Td) vaccine. You may need a Td booster every 10 years. ?Zoster vaccine. You may need this after age 58. ?Pneumococcal 13-valent conjugate (PCV13) vaccine. One dose is  recommended after age 50. ?Pneumococcal polysaccharide (PPSV23) vaccine. One dose is recommended after age 63. ?Talk to your health care provider about which screenings and vaccines you need and how often you need them. ?This information is not intended to replace advice given to you by your health care provider. Make sure you discuss any questions you have with your health care provider. ?Document Released: 05/05/2015 Document Revised: 12/27/2015 Document Reviewed: 02/07/2015 ?Elsevier Interactive Patient Education ? 2017 [place]. ? ?Fall Prevention in the Home ?Falls can cause injuries. They can happen to people of all ages. There are many things you can do to make your home safe and to help prevent falls. ?What can I do on the outside of my home? ?Regularly fix the edges of walkways and driveways and fix any cracks. ?Remove anything that might make you trip as you walk through a door, such as a raised step or threshold. ?Trim any bushes or trees on the path to your home. ?Use bright outdoor lighting. ?Clear any walking paths of anything that might make someone trip, such as rocks or tools. ?Regularly check to see if handrails are loose or broken. Make sure that both sides of any steps have handrails. ?Any raised decks and porches should have guardrails on the edges. ?Have any leaves, snow, or ice cleared regularly. ?Use sand or salt on walking paths during winter. ?Clean up any spills in your garage right away. This includes oil or grease spills. ?What can I do in the bathroom? ?Use night lights. ?Install grab bars by the toilet and in the tub and shower. Do not use towel bars as grab bars. ?Use non-skid mats or decals in the tub or shower. ?If you need to sit down in the shower, use a plastic, non-slip stool. ?Keep the floor dry. Clean up any water that spills on the floor as soon as it happens. ?Remove soap buildup in the tub or shower regularly. ?Attach bath mats securely with double-sided non-slip rug  tape. ?Do not have throw rugs and other things on the floor that can make you trip. ?What can I do in the bedroom? ?Use night lights. ?Make sure that you have a light by your bed that is easy to reach. ?Do not use any sheets or blankets that are too big for your bed. They should not hang down onto the floor. ?Have a firm chair that has side arms. You can use this for support while you get dressed. ?Do not have throw rugs and other things on the floor that can make you trip. ?What can I do in the kitchen? ?Clean up any spills right away. ?Avoid walking on wet floors. ?Keep items that you use a lot in easy-to-reach places. ?If you need to reach something above you, use a strong step stool that has a grab bar. ?Keep electrical cords out of the way. ?Do not use floor polish or wax that makes floors slippery. If you must use wax, use non-skid floor wax. ?Do not have throw rugs and other things on the floor that can make you trip. ?What can I do with my stairs? ?Do not leave any items on the stairs. ?Make sure that  there are handrails on both sides of the stairs and use them. Fix handrails that are broken or loose. Make sure that handrails are as long as the stairways. ?Check any carpeting to make sure that it is firmly attached to the stairs. Fix any carpet that is loose or worn. ?Avoid having throw rugs at the top or bottom of the stairs. If you do have throw rugs, attach them to the floor with carpet tape. ?Make sure that you have a light switch at the top of the stairs and the bottom of the stairs. If you do not have them, ask someone to add them for you. ?What else can I do to help prevent falls? ?Wear shoes that: ?Do not have high heels. ?Have rubber bottoms. ?Are comfortable and fit you well. ?Are closed at the toe. Do not wear sandals. ?If you use a stepladder: ?Make sure that it is fully opened. Do not climb a closed stepladder. ?Make sure that both sides of the stepladder are locked into place. ?Ask someone to  hold it for you, if possible. ?Clearly mark and make sure that you can see: ?Any grab bars or handrails. ?First and last steps. ?Where the edge of each step is. ?Use tools that help you move around (mobility ai

## 2021-08-07 NOTE — Progress Notes (Addendum)
? ?Subjective:  ? Kenneth Sedeno. is a 66 y.o. male who presents for Medicare Annual/Subsequent preventive examination. ? ?Review of Systems    ?Virtual Visit via Telephone Note ? ?I connected with  Kenneth [name]. on 08/07/21 at 10:15 AM EDT by telephone and verified that I am speaking with the correct person using two identifiers. ? ?Location: ?Patient: Home ?Provider: Office ?Persons participating in the virtual visit: patient/Nurse Health Advisor ?  ?I discussed the limitations, risks, security and privacy concerns of performing an evaluation and management service by telephone and the availability of in person appointments. The patient expressed understanding and agreed to proceed. ? ?Interactive audio and video telecommunications were attempted between this nurse and patient, however failed, due to patient having technical difficulties OR patient did not have access to video capability.  We continued and completed visit with audio only. ? ?Some vital signs may be absent or patient reported.  ? ?Criselda Peaches, LPN  ?Cardiac Risk Factors include: advanced age (>31mn, >>74women);male gender ? ?   ?Objective:  ?  ?Today's Vitals  ? 08/07/21 1015  ?Weight: 207 lb (93.9 kg)  ?Height: '5\' 10"'$  (1.778 m)  ? ?Body mass index is 29.7 kg/m?. ? ? ?  08/07/2021  ? 10:27 AM  ?Advanced Directives  ?Does Patient Have a Medical Advance Directive? Yes  ?Type of AParamedicof AShellLiving will  ?Does patient want to make changes to medical advance directive? No - Patient declined  ?Copy of HCrispin Chart? No - copy requested  ? ? ?Current Medications (verified) ?Outpatient Encounter Medications as of 08/07/2021  ?Medication Sig  ? albuterol (VENTOLIN HFA) 108 (90 Base) MCG/ACT inhaler Inhale 1-2 puffs into the lungs every 6 (six) hours as needed for wheezing or shortness of breath.  ? ALPRAZolam (XANAX) 0.25 MG tablet TAKE 2 TABLETS(0.5 MG) BY MOUTH AT BEDTIME AS  NEEDED FOR SLEEP  ? Ascorbic Acid (VITAMIN C) 1000 MG tablet Take 1,000 mg by mouth daily.  ? calcium carbonate (OS-CAL) 600 MG TABS Take 1,200 mg by mouth daily.  ? Cholecalciferol (VITAMIN D3) 2000 UNITS TABS Take 1,000 Units by mouth daily.  ? CHONDROITIN SULFATE PO Take by mouth.  ? cyanocobalamin 500 MCG tablet Take 1,000 mcg by mouth daily.  ? diclofenac Sodium (VOLTAREN) 1 % GEL Apply 2 g topically 4 (four) times daily.  ? DILANTIN 100 MG ER capsule TAKE 5 CAPSULES DAILY AS   DIRECTED  ? ezetimibe (ZETIA) 10 MG tablet Take 1 tablet (10 mg total) by mouth daily.  ? fexofenadine (ALLEGRA) 180 MG tablet Take 180 mg by mouth daily.  ? fluocinonide gel (LIDEX) 07.12% Apply 1 application topically 3 (three) times daily as needed.   ? Melatonin 10 MG TABS Take by mouth.  ? naproxen (NAPROSYN) 500 MG tablet TAKE 1 TABLET(500 MG) BY MOUTH DAILY AS NEEDED FOR MODERATE PAIN  ? Omega-3 Fatty Acids (FISH OIL) 300 MG CAPS Take by mouth. (Mega Red)  ? Probiotic Product (PROBIOTIC ADVANCED PO) Take by mouth.  ? promethazine-dextromethorphan (PROMETHAZINE-DM) 6.25-15 MG/5ML syrup Take 5 mLs by mouth 4 (four) times daily as needed for cough.  ? rosuvastatin (CRESTOR) 10 MG tablet TAKE 1 TABLET DAILY  ? TURMERIC PO Take by mouth.  ? ?No facility-administered encounter medications on file as of 08/07/2021.  ? ? ?Allergies (verified) ?Patient has no known allergies.  ? ?History: ?Past Medical History:  ?Diagnosis Date  ? Allergy   ?  Hx of colonic polyp 2007  ? Hyperlipidemia   ? Insomnia 06/02/2014  ? Syncope   ? in 20s; no etiology, no recurrence. Dilantin Rxed  ? ?Past Surgical History:  ?Procedure Laterality Date  ? colonoscopy with polypectomy  2007  ? Dr Carlean Purl, 4 mm rectal polyp  ? DENTAL SURGERY    ? bone grafts, to get implants  ? NASAL FRACTURE SURGERY    ? TONSILLECTOMY AND ADENOIDECTOMY    ? ?Family History  ?Problem Relation Age of Onset  ? Macular degeneration Father   ? Cancer Mother   ?     throat; smoker  ? Macular  degeneration Paternal Uncle   ? Rectal cancer Other   ?     maternal cousin  ? Colon cancer Neg Hx   ? Esophageal cancer Neg Hx   ? Stomach cancer Neg Hx   ? Stroke Neg Hx   ? Heart disease Neg Hx   ? Diabetes Neg Hx   ? Prostate cancer Neg Hx   ? ?Social History  ? ?Socioeconomic History  ? Marital status: Married  ?  Spouse name: Not on file  ? Number of children: 0  ? Years of education: Not on file  ? Highest education level: Not on file  ?Occupational History  ? Occupation: part Armed forces logistics/support/administrative officer, works from home  ?  Comment: Property management  ?Tobacco Use  ? Smoking status: Never  ? Smokeless tobacco: Never  ?Substance and Sexual Activity  ? Alcohol use: Yes  ?  Alcohol/week: 6.0 - 8.0 standard drinks  ?  Types: 6 - 8 Cans of beer per week  ?  Comment: 1-2 beers qd   ? Drug use: No  ? Sexual activity: Not on file  ?Other Topics Concern  ? Not on file  ?Social History Narrative  ? Household pt and wife  ? ?Social Determinants of Health  ? ?Financial Resource Strain: Low Risk   ? Difficulty of Paying Living Expenses: Not hard at all  ?Food Insecurity: No Food Insecurity  ? Worried About Charity fundraiser in the Last Year: Never true  ? Ran Out of Food in the Last Year: Never true  ?Transportation Needs: No Transportation Needs  ? Lack of Transportation (Medical): No  ? Lack of Transportation (Non-Medical): No  ?Physical Activity: Inactive  ? Days of Exercise per Week: 0 days  ? Minutes of Exercise per Session: 0 min  ?Stress: No Stress Concern Present  ? Feeling of Stress : Not at all  ?Social Connections: Moderately Isolated  ? Frequency of Communication with Friends and Family: More than three times a week  ? Frequency of Social Gatherings with Friends and Family: More than three times a week  ? Attends Religious Services: Never  ? Active Member of Clubs or Organizations: No  ? Attends Archivist Meetings: Never  ? Marital Status: Married  ? ? ? ?Clinical Intake: ? ?How often do you need to  have someone help you when you read instructions, pamphlets, or other written materials from your doctor or pharmacy?: 1 - Never ? ?Diabetic?  No ? ?Interpreter Needed?: No ?Activities of Daily Living ? ?  08/07/2021  ? 10:25 AM 01/24/2021  ? 10:59 AM  ?In your present state of health, do you have any difficulty performing the following activities:  ?Hearing? 0 0  ?Vision? 0 0  ?Difficulty concentrating or making decisions? 0 0  ?Walking or climbing stairs? 0 0  ?Dressing or  bathing? 0 0  ?Doing errands, shopping? 0 0  ?Preparing Food and eating ? N   ?Using the Toilet? N   ?In the past six months, have you accidently leaked urine? N   ?Do you have problems with loss of bowel control? N   ?Managing your Medications? N   ?Managing your Finances? N   ?Housekeeping or managing your Housekeeping? N   ? ? ?Patient Care Team: ?Colon Branch, MD as PCP - General (Internal Medicine) ?Ulyses Southward., MD as Consulting Physician (Urology) ? ?Indicate any recent Medical Services you may have received from other than [facility] providers in the past year (date may be approximate). ? ?   ?Assessment:  ? This is a routine wellness examination for Jaman. ? ?Hearing/Vision screen ?Hearing Screening - Comments:: No hearing difficulty ?Vision Screening - Comments:: Wears reading glasses. Followed by Dr Idolina Primer ? ?Dietary issues and exercise activities discussed: ?Exercise limited by: None identified ? ? Goals Addressed   ? ?  ?  ?  ?  ?  ? This Visit's Progress  ?   Increase physical activity (pt-stated)     ?   Maintain a healthy life. ?  ? ?  ? ?Depression Screen ? ?  08/07/2021  ? 10:20 AM 01/24/2021  ? 10:59 AM 07/12/2020  ? 10:18 AM 12/15/2019  ?  2:44 PM 11/19/2018  ?  8:40 AM 07/21/2017  ?  9:33 AM 11/23/2015  ?  9:14 AM  ?PHQ 2/9 Scores  ?PHQ - 2 Score 0 0 0 0 0 0 0  ?  ?Fall Risk ? ?  08/07/2021  ? 10:26 AM 01/24/2021  ? 10:59 AM 07/12/2020  ? 10:18 AM 12/15/2019  ?  2:44 PM 11/19/2018  ?  8:41 AM  ?Fall Risk   ?Falls in the past year? 0 0 0 0 0   ?Number falls in past yr: 0 0 0 0   ?Injury with Fall? 0 0 0 0   ?Risk for fall due to : No Fall Risks      ?Follow up  Falls evaluation completed  Falls evaluation completed   ? ? ?FALL RISK PREVENTION

## 2021-08-10 ENCOUNTER — Other Ambulatory Visit: Payer: Self-pay | Admitting: Internal Medicine

## 2021-09-19 NOTE — Progress Notes (Unsigned)
Benito Mccreedy D.[place] Phone: [phone]   Assessment and Plan:     There are no diagnoses linked to this encounter.  ***   Pertinent previous records reviewed include ***   Follow Up: ***     Subjective:   I,  , am serving as a Education administrator for [name]   Chief Complaint: bilateral knee pain    HPI:   Pt is a 66 y/o male c/o bilat knee pain, L>R. Pt notes knees have bothered him for years intermittently. Pt locates pain to the anterior aspect of bilat knees. Pt reports the pain is to the point where it's waking him up at night when it is flared.  Currently says that pain is tolerable on a day-to-day basis and is not limiting him from any activities.  Denies falls or trauma.  Denies numbness/tingling/weakness in extremities   12/27/20 Patient states Last night at 3 am woke up with sharp/aching pain and knees were hot to the touch. Patient used ice packs to help with the pain to fall asleep. Patient states he wasn't any more active than usual, but states that he needs to do something different to help with the pain so he can get back to his regular quality of life.    01/10/21 Patient states that mostly feeling better, still has some popping and cracking in the morning. Patient states that he still is having a tender spot on the top of his knee cap that when it hits something it is very tender. Patient states he had doen a lot of steps recently and he is still doing well.    05/21/21 Patient states that the knee are aching, kneecaps aren't bothering like they were just wants bilateral injections  09/20/2021 Patient states  Relevant Historical Information: ***  Additional pertinent review of systems negative.   Current Outpatient Medications:    albuterol (VENTOLIN HFA) 108 (90 Base) MCG/ACT inhaler, Inhale 1-2 puffs into the lungs every 6 (six) hours as needed for wheezing or  shortness of breath., Disp: 8 g, Rfl: 0   ALPRAZolam (XANAX) 0.25 MG tablet, TAKE 2 TABLETS(0.5 MG) BY MOUTH AT BEDTIME AS NEEDED FOR SLEEP, Disp: 60 tablet, Rfl: 5   Ascorbic Acid (VITAMIN C) 1000 MG tablet, Take 1,000 mg by mouth daily., Disp: , Rfl:    calcium carbonate (OS-CAL) 600 MG TABS, Take 1,200 mg by mouth daily., Disp: , Rfl:    Cholecalciferol (VITAMIN D3) 2000 UNITS TABS, Take 1,000 Units by mouth daily., Disp: , Rfl:    CHONDROITIN SULFATE PO, Take by mouth., Disp: , Rfl:    cyanocobalamin 500 MCG tablet, Take 1,000 mcg by mouth daily., Disp: , Rfl:    diclofenac Sodium (VOLTAREN) 1 % GEL, Apply 2 g topically 4 (four) times daily., Disp: , Rfl:    DILANTIN 100 MG ER capsule, TAKE 5 CAPSULES DAILY AS   DIRECTED, Disp: 450 capsule, Rfl: 2   ezetimibe (ZETIA) 10 MG tablet, TAKE 1 TABLET DAILY, Disp: 90 tablet, Rfl: 1   fexofenadine (ALLEGRA) 180 MG tablet, Take 180 mg by mouth daily., Disp: , Rfl:    fluocinonide gel (LIDEX) 6.38 %, Apply 1 application topically 3 (three) times daily as needed. , Disp: , Rfl:    Melatonin 10 MG TABS, Take by mouth., Disp: , Rfl:    naproxen (NAPROSYN) 500 MG tablet, TAKE 1 TABLET(500 MG) BY MOUTH DAILY AS NEEDED FOR MODERATE PAIN,  Disp: 30 tablet, Rfl: 3   Omega-3 Fatty Acids (FISH OIL) 300 MG CAPS, Take by mouth. (Mega Red), Disp: , Rfl:    Probiotic Product (PROBIOTIC ADVANCED PO), Take by mouth., Disp: , Rfl:    promethazine-dextromethorphan (PROMETHAZINE-DM) 6.25-15 MG/5ML syrup, Take 5 mLs by mouth 4 (four) times daily as needed for cough., Disp: 118 mL, Rfl: 0   rosuvastatin (CRESTOR) 10 MG tablet, TAKE 1 TABLET DAILY, Disp: 90 tablet, Rfl: 2   TURMERIC PO, Take by mouth., Disp: , Rfl:    Objective:     There were no vitals filed for this visit.    There is no height or weight on file to calculate BMI.    Physical Exam:    ***   Electronically signed by:  Benito Mccreedy D.[facility] 11:35 AM 09/19/21

## 2021-09-20 ENCOUNTER — Ambulatory Visit (INDEPENDENT_AMBULATORY_CARE_PROVIDER_SITE_OTHER): Payer: Medicare Other | Admitting: Sports Medicine

## 2021-09-20 VITALS — BP 130/74 | HR 71 | Ht 70.0 in | Wt 204.0 lb

## 2021-09-20 DIAGNOSIS — M25562 Pain in left knee: Secondary | ICD-10-CM | POA: Diagnosis not present

## 2021-09-20 DIAGNOSIS — G8929 Other chronic pain: Secondary | ICD-10-CM | POA: Diagnosis not present

## 2021-09-20 DIAGNOSIS — M170 Bilateral primary osteoarthritis of knee: Secondary | ICD-10-CM | POA: Diagnosis not present

## 2021-09-20 DIAGNOSIS — M25561 Pain in right knee: Secondary | ICD-10-CM | POA: Diagnosis not present

## 2021-09-20 NOTE — Patient Instructions (Signed)
Good to see you  ?As needed follow up  ?

## 2021-12-31 NOTE — Progress Notes (Unsigned)
Kenneth [name] D.[place] Phone: [phone]   Assessment and Plan:     There are no diagnoses linked to this encounter.  ***   Pertinent previous records reviewed include ***   Follow Up: ***     Subjective:   I,  , am serving as a Education administrator for [name]   Chief Complaint: bilateral knee pain    HPI:   Pt is a 66 y/o male c/o bilat knee pain, L>R. Pt notes knees have bothered him for years intermittently. Pt locates pain to the anterior aspect of bilat knees. Pt reports the pain is to the point where it's waking him up at night when it is flared.  Currently says that pain is tolerable on a day-to-day basis and is not limiting him from any activities.  Denies falls or trauma.  Denies numbness/tingling/weakness in extremities   12/27/20 Patient states Last night at 3 am woke up with sharp/aching pain and knees were hot to the touch. Patient used ice packs to help with the pain to fall asleep. Patient states he wasn't any more active than usual, but states that he needs to do something different to help with the pain so he can get back to his regular quality of life.    01/10/21 Patient states that mostly feeling better, still has some popping and cracking in the morning. Patient states that he still is having a tender spot on the top of his knee cap that when it hits something it is very tender. Patient states he had doen a lot of steps recently and he is still doing well.    05/21/21 Patient states that the knee are aching, kneecaps aren't bothering like they were just wants bilateral injections   09/20/2021 Patient states ready for injections was doing really well up until a week ago   01/01/2022 Patient states   Relevant Historical Information: None pertinent  Additional pertinent review of systems negative.   Current Outpatient Medications:    albuterol (VENTOLIN HFA)  108 (90 Base) MCG/ACT inhaler, Inhale 1-2 puffs into the lungs every 6 (six) hours as needed for wheezing or shortness of breath., Disp: 8 g, Rfl: 0   ALPRAZolam (XANAX) 0.25 MG tablet, TAKE 2 TABLETS(0.5 MG) BY MOUTH AT BEDTIME AS NEEDED FOR SLEEP, Disp: 60 tablet, Rfl: 5   Ascorbic Acid (VITAMIN C) 1000 MG tablet, Take 1,000 mg by mouth daily., Disp: , Rfl:    calcium carbonate (OS-CAL) 600 MG TABS, Take 1,200 mg by mouth daily., Disp: , Rfl:    Cholecalciferol (VITAMIN D3) 2000 UNITS TABS, Take 1,000 Units by mouth daily., Disp: , Rfl:    CHONDROITIN SULFATE PO, Take by mouth., Disp: , Rfl:    cyanocobalamin 500 MCG tablet, Take 1,000 mcg by mouth daily., Disp: , Rfl:    diclofenac Sodium (VOLTAREN) 1 % GEL, Apply 2 g topically 4 (four) times daily., Disp: , Rfl:    DILANTIN 100 MG ER capsule, TAKE 5 CAPSULES DAILY AS   DIRECTED, Disp: 450 capsule, Rfl: 2   ezetimibe (ZETIA) 10 MG tablet, TAKE 1 TABLET DAILY, Disp: 90 tablet, Rfl: 1   fexofenadine (ALLEGRA) 180 MG tablet, Take 180 mg by mouth daily., Disp: , Rfl:    fluocinonide gel (LIDEX) 9.38 %, Apply 1 application topically 3 (three) times daily as needed. , Disp: , Rfl:    Melatonin 10 MG TABS, Take by mouth., Disp: , Rfl:  naproxen (NAPROSYN) 500 MG tablet, TAKE 1 TABLET(500 MG) BY MOUTH DAILY AS NEEDED FOR MODERATE PAIN, Disp: 30 tablet, Rfl: 3   Omega-3 Fatty Acids (FISH OIL) 300 MG CAPS, Take by mouth. (Mega Red), Disp: , Rfl:    Probiotic Product (PROBIOTIC ADVANCED PO), Take by mouth., Disp: , Rfl:    promethazine-dextromethorphan (PROMETHAZINE-DM) 6.25-15 MG/5ML syrup, Take 5 mLs by mouth 4 (four) times daily as needed for cough., Disp: 118 mL, Rfl: 0   rosuvastatin (CRESTOR) 10 MG tablet, TAKE 1 TABLET DAILY, Disp: 90 tablet, Rfl: 2   TURMERIC PO, Take by mouth., Disp: , Rfl:    Objective:     There were no vitals filed for this visit.    There is no height or weight on file to calculate BMI.    Physical Exam:     ***   Electronically signed by:  Kenneth [name] D.[facility] 8:43 AM 12/31/21

## 2022-01-01 ENCOUNTER — Ambulatory Visit (INDEPENDENT_AMBULATORY_CARE_PROVIDER_SITE_OTHER): Payer: Medicare Other | Admitting: Sports Medicine

## 2022-01-01 VITALS — BP 118/80 | HR 63 | Ht 70.0 in | Wt 204.0 lb

## 2022-01-01 DIAGNOSIS — M25562 Pain in left knee: Secondary | ICD-10-CM | POA: Diagnosis not present

## 2022-01-01 DIAGNOSIS — M25561 Pain in right knee: Secondary | ICD-10-CM

## 2022-01-01 DIAGNOSIS — G8929 Other chronic pain: Secondary | ICD-10-CM

## 2022-01-01 DIAGNOSIS — M170 Bilateral primary osteoarthritis of knee: Secondary | ICD-10-CM | POA: Diagnosis not present

## 2022-01-01 NOTE — Patient Instructions (Addendum)
Good to see you  Can follow up in 2-3 weeks if hip pain continues  3 month follow up for zillreta injections

## 2022-01-02 ENCOUNTER — Other Ambulatory Visit: Payer: Self-pay | Admitting: Internal Medicine

## 2022-01-07 ENCOUNTER — Encounter: Payer: Self-pay | Admitting: Internal Medicine

## 2022-01-17 ENCOUNTER — Telehealth: Payer: Self-pay | Admitting: Internal Medicine

## 2022-01-17 NOTE — Telephone Encounter (Signed)
PDMP okay, Rx sent 

## 2022-01-17 NOTE — Telephone Encounter (Signed)
Requesting: alprazolam 0.'25mg'$   Contract:07/12/20 UDS:Pt refuses to do Last Visit:01/24/21 Next Visit:01/30/22 Last Refill: 05/21/21 #60 and 5RF  Please Advise

## 2022-01-24 ENCOUNTER — Ambulatory Visit (INDEPENDENT_AMBULATORY_CARE_PROVIDER_SITE_OTHER): Payer: Medicare Other | Admitting: Sports Medicine

## 2022-01-24 VITALS — HR 80 | Ht 70.0 in | Wt 201.0 lb

## 2022-01-24 DIAGNOSIS — M170 Bilateral primary osteoarthritis of knee: Secondary | ICD-10-CM | POA: Diagnosis not present

## 2022-01-24 DIAGNOSIS — M25561 Pain in right knee: Secondary | ICD-10-CM

## 2022-01-24 DIAGNOSIS — G8929 Other chronic pain: Secondary | ICD-10-CM | POA: Diagnosis not present

## 2022-01-24 DIAGNOSIS — M25562 Pain in left knee: Secondary | ICD-10-CM | POA: Diagnosis not present

## 2022-01-24 NOTE — Progress Notes (Signed)
Kenneth [name] D.[place] Phone: [phone]   Assessment and Plan:     1. Osteoarthritis of patellofemoral joints of both knees 2. Chronic pain of both knees  -Chronic with exacerbation, subsequent visit - Minimal to no relief in bilateral knee pain after bilateral intra-articular knee CSI at previous office visit on 01/01/2022.  CSI has been gradually less and less effective for patient - Patient agreeable to trialing HA injections.  Will obtain prior authorization and patient can follow-up in 1 week for injection only procedure - Start Tylenol 500 to 1000 mg tablets 2-3 times a day for day-to-day pain relief - Recommend limiting naproxen 500 mg use to no more than 1-2 times maximum per week due to risk of long-term side effects of chronic NSAID use  Pertinent previous records reviewed include left and right knee x-ray 12/18/2020   Follow Up: Patient agreeable to trialing HA injections.  Will obtain prior authorization and patient can follow-up in 1 week for injection only procedure     Subjective:   I, Kenneth [name], am serving as a Education administrator for Kenneth [name]   Chief Complaint: bilateral knee pain    HPI:   Pt is a 66 y/o male c/o bilat knee pain, L>R. Pt notes knees have bothered him for years intermittently. Pt locates pain to the anterior aspect of bilat knees. Pt reports the pain is to the point where it's waking him up at night when it is flared.  Currently says that pain is tolerable on a day-to-day basis and is not limiting him from any activities.  Denies falls or trauma.  Denies numbness/tingling/weakness in extremities   12/27/20 Patient states Last night at 3 am woke up with sharp/aching pain and knees were hot to the touch. Patient used ice packs to help with the pain to fall asleep. Patient states he wasn't any more active than usual, but states that he needs to do something different to  help with the pain so he can get back to his regular quality of life.    01/10/21 Patient states that mostly feeling better, still has some popping and cracking in the morning. Patient states that he still is having a tender spot on the top of his knee cap that when it hits something it is very tender. Patient states he had doen a lot of steps recently and he is still doing well.    05/21/21 Patient states that the knee are aching, kneecaps aren't bothering like they were just wants bilateral injections   09/20/2021 Patient states ready for injections was doing really well up until a week ago   01/01/2022 Patient states that he is here for injections at the least     01/24/2022 Patient states are the same if not worst  Relevant Historical Information: None pertinent  Additional pertinent review of systems negative.   Current Outpatient Medications:    albuterol (VENTOLIN HFA) 108 (90 Base) MCG/ACT inhaler, Inhale 1-2 puffs into the lungs every 6 (six) hours as needed for wheezing or shortness of breath., Disp: 8 g, Rfl: 0   ALPRAZolam (XANAX) 0.25 MG tablet, TAKE 2 TABLETS(0.5 MG) BY MOUTH AT BEDTIME AS NEEDED FOR SLEEP, Disp: 60 tablet, Rfl: 3   Ascorbic Acid (VITAMIN C) 1000 MG tablet, Take 1,000 mg by mouth daily., Disp: , Rfl:    calcium carbonate (OS-CAL) 600 MG TABS, Take 1,200 mg by mouth daily., Disp: , Rfl:  Cholecalciferol (VITAMIN D3) 2000 UNITS TABS, Take 1,000 Units by mouth daily., Disp: , Rfl:    CHONDROITIN SULFATE PO, Take by mouth., Disp: , Rfl:    cyanocobalamin 500 MCG tablet, Take 1,000 mcg by mouth daily., Disp: , Rfl:    diclofenac Sodium (VOLTAREN) 1 % GEL, Apply 2 g topically 4 (four) times daily., Disp: , Rfl:    DILANTIN 100 MG ER capsule, TAKE 5 CAPSULES DAILY AS   DIRECTED, Disp: 450 capsule, Rfl: 2   ezetimibe (ZETIA) 10 MG tablet, Take 1 tablet (10 mg total) by mouth daily., Disp: 90 tablet, Rfl: 1   fexofenadine (ALLEGRA) 180 MG tablet, Take 180 mg by  mouth daily., Disp: , Rfl:    fluocinonide gel (LIDEX) 4.56 %, Apply 1 application topically 3 (three) times daily as needed. , Disp: , Rfl:    Melatonin 10 MG TABS, Take by mouth., Disp: , Rfl:    naproxen (NAPROSYN) 500 MG tablet, TAKE 1 TABLET(500 MG) BY MOUTH DAILY AS NEEDED FOR MODERATE PAIN, Disp: 30 tablet, Rfl: 3   Omega-3 Fatty Acids (FISH OIL) 300 MG CAPS, Take by mouth. (Mega Red), Disp: , Rfl:    Probiotic Product (PROBIOTIC ADVANCED PO), Take by mouth., Disp: , Rfl:    promethazine-dextromethorphan (PROMETHAZINE-DM) 6.25-15 MG/5ML syrup, Take 5 mLs by mouth 4 (four) times daily as needed for cough., Disp: 118 mL, Rfl: 0   rosuvastatin (CRESTOR) 10 MG tablet, Take 1 tablet (10 mg total) by mouth daily., Disp: 90 tablet, Rfl: 1   TURMERIC PO, Take by mouth., Disp: , Rfl:    Objective:     Vitals:   01/24/22 1326  Pulse: 80  SpO2: 96%  Weight: 201 lb (91.2 kg)  Height: '5\' 10"'$  (1.778 m)      Body mass index is 28.84 kg/m.    Physical Exam:    General:  awake, alert oriented, no acute distress nontoxic Skin: no suspicious lesions or rashes Neuro:sensation intact, no deficits, strength 5/5 with no deficits, no atrophy, normal muscle tone Psych: No signs of anxiety, depression or other mood disorder   Bilateral knee: No swelling No deformity Neg fluid wave, joint milking ROM Flex 100, Ext 5 TTP mildly medial and lateral joint line NTTP over the quad tendon, medial fem condyle, lat fem condyle, patella, plica, patella tendon, tibial tuberostiy, fibular head, posterior fossa, pes anserine bursa, gerdy's tubercle,   Neg anterior and posterior drawer Neg lachman Neg sag sign Negative varus stress Negative valgus stress Negative [place]   Electronically signed by:  Kenneth [name] D.[facility] 1:43 PM 01/24/22

## 2022-01-24 NOTE — Patient Instructions (Addendum)
Good to see you  Tylenol [phone] mg 2-3 times a day for pain relief  1 week follow up

## 2022-01-30 ENCOUNTER — Encounter: Payer: Self-pay | Admitting: Internal Medicine

## 2022-01-30 ENCOUNTER — Ambulatory Visit (INDEPENDENT_AMBULATORY_CARE_PROVIDER_SITE_OTHER): Payer: Medicare Other | Admitting: Internal Medicine

## 2022-01-30 VITALS — BP 116/72 | HR 55 | Temp 97.8°F | Resp 18 | Ht 70.0 in | Wt 200.4 lb

## 2022-01-30 DIAGNOSIS — R55 Syncope and collapse: Secondary | ICD-10-CM

## 2022-01-30 DIAGNOSIS — R7989 Other specified abnormal findings of blood chemistry: Secondary | ICD-10-CM

## 2022-01-30 DIAGNOSIS — E785 Hyperlipidemia, unspecified: Secondary | ICD-10-CM

## 2022-01-30 DIAGNOSIS — Z0000 Encounter for general adult medical examination without abnormal findings: Secondary | ICD-10-CM

## 2022-01-30 DIAGNOSIS — Z23 Encounter for immunization: Secondary | ICD-10-CM

## 2022-01-30 DIAGNOSIS — Z1211 Encounter for screening for malignant neoplasm of colon: Secondary | ICD-10-CM

## 2022-01-30 DIAGNOSIS — Z0001 Encounter for general adult medical examination with abnormal findings: Secondary | ICD-10-CM

## 2022-01-30 LAB — COMPREHENSIVE METABOLIC PANEL
ALT: 104 U/L — ABNORMAL HIGH (ref 0–53)
AST: 70 U/L — ABNORMAL HIGH (ref 0–37)
Albumin: 4.3 g/dL (ref 3.5–5.2)
Alkaline Phosphatase: 89 U/L (ref 39–117)
BUN: 16 mg/dL (ref 6–23)
CO2: 27 mEq/L (ref 19–32)
Calcium: 9.4 mg/dL (ref 8.4–10.5)
Chloride: 100 mEq/L (ref 96–112)
Creatinine, Ser: 0.78 mg/dL (ref 0.40–1.50)
GFR: 91.75 mL/min (ref >60.00)
Glucose, Bld: 106 mg/dL — ABNORMAL HIGH (ref 70–99)
Potassium: 4.1 mEq/L (ref 3.5–5.1)
Sodium: 135 mEq/L (ref 135–145)
Total Bilirubin: 0.6 mg/dL (ref 0.2–1.2)
Total Protein: 6.6 g/dL (ref 6.0–8.3)

## 2022-01-30 LAB — LIPID PANEL
Cholesterol: 172 mg/dL (ref 0–200)
HDL: 77.9 mg/dL (ref >39.00)
LDL Cholesterol: 75 mg/dL (ref 0–99)
NonHDL: 93.8
Total CHOL/HDL Ratio: 2
Triglycerides: 96 mg/dL (ref 0.0–149.0)
VLDL: 19.2 mg/dL (ref 0.0–40.0)

## 2022-01-30 LAB — CBC WITH DIFFERENTIAL/PLATELET
Basophils Absolute: 0 10*3/uL (ref 0.0–0.1)
Basophils Relative: 0.8 % (ref 0.0–3.0)
Eosinophils Absolute: 0.4 10*3/uL (ref 0.0–0.7)
Eosinophils Relative: 7.1 % — ABNORMAL HIGH (ref 0.0–5.0)
HCT: 45.7 % (ref 39.0–52.0)
Hemoglobin: 15.6 g/dL (ref 13.0–17.0)
Lymphocytes Relative: 16.4 % (ref 12.0–46.0)
Lymphs Abs: 0.9 10*3/uL (ref 0.7–4.0)
MCHC: 34.1 g/dL (ref 30.0–36.0)
MCV: 100.9 fl — ABNORMAL HIGH (ref 78.0–100.0)
Monocytes Absolute: 0.7 10*3/uL (ref 0.1–1.0)
Monocytes Relative: 12.2 % — ABNORMAL HIGH (ref 3.0–12.0)
Neutro Abs: 3.5 10*3/uL (ref 1.4–7.7)
Neutrophils Relative %: 63.5 % (ref 43.0–77.0)
Platelets: 240 10*3/uL (ref 150.0–400.0)
RBC: 4.53 Mil/uL (ref 4.22–5.81)
RDW: 12.5 % (ref 11.5–15.5)
WBC: 5.5 10*3/uL (ref 4.0–10.5)

## 2022-01-30 LAB — TSH: TSH: 1.7 u[IU]/mL (ref 0.35–5.50)

## 2022-01-30 LAB — B12 AND FOLATE PANEL
Folate: 7.3 ng/mL (ref >5.9)
Vitamin B-12: 998 pg/mL — ABNORMAL HIGH (ref 211–911)

## 2022-01-30 LAB — PSA: PSA: 0.84 ng/mL (ref 0.10–4.00)

## 2022-01-30 NOTE — Patient Instructions (Addendum)
We are referring you to gastroenterology. To discuss increased liver test and colonoscopy.  Vaccines I recommend:  Covid booster RSV vaccine     GO TO THE LAB : Get the blood work     [place], [place] back for a checkup in 6 months     Advanced care planning:  Do you have a "Living will", "[place] of attorney" ?   If you already have a living will or healthcare power of attorney, is recommended you bring the copy to be scanned in your chart. The document will be available to all the doctors you see in the system.  If you don't have one, please consider create one.  Advance care planning is a process that supports adults in  understanding and sharing their preferences regarding future medical care.   The patient's preferences are recorded in documents called Advance Directives.    Advanced directives are completed (and can be modified at any time) while the patient is in full mental capacity.   The documentation should be available at all times to the patient, the family and the healthcare providers.   This legal documents direct treatment decision making and/or appoint a surrogate to make the decision if the patient is not capable to do so.    Advance directives can be documented in many types of formats,  documents have names such as:  Lliving will  Durable power of attorney for healthcare (healthcare proxy or healthcare power of attorney)  Combined directives  Physician orders for life-sustaining treatment    More information at:  [email or web address]

## 2022-01-30 NOTE — Progress Notes (Signed)
Subjective:    Patient ID: Kenneth [name]., male    DOB: 08/17/1955, 66 y.o.   MRN: [id number]  DOS:  01/30/2022 Type of visit - description: CPX  Since the last office visit is doing well. Has occasional allergies. Good med compliance  Review of Systems  Other than above, a 14 point review of systems is negative     Past Medical History:  Diagnosis Date   Allergy    Hx of colonic polyp 2007   Hyperlipidemia    Insomnia 06/02/2014   Syncope    in 20s; no etiology, no recurrence. 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  Tobacco Use   Smoking status: Never   Smokeless tobacco: Never  Substance and Sexual Activity   Alcohol use: Yes    Alcohol/week: 6.0 - 8.0 standard drinks of alcohol    Types: 6 - 8 Cans of beer per week    Comment: 2-3  beers qd   Drug use: Yes    Types: PCP   Sexual activity: Not on file  Other Topics Concern   Not on file  Social History Narrative   Household pt and wife   Social Determinants of Health   Financial Resource Strain: Low Risk  (08/07/2021)   Overall Financial Resource Strain (CARDIA)    Difficulty of Paying Living Expenses: Not hard at all  Food Insecurity: No Food Insecurity (08/07/2021)   Hunger Vital Sign    Worried About Running Out of Food in the Last Year: Never true    Ran Out of Food in the Last Year: Never true  Transportation Needs: No Transportation Needs (08/07/2021)   PRAPARE - Armed forces logistics/support/administrative officer (Medical): No    Lack of Transportation (Non-Medical): No  Physical Activity: Inactive (08/07/2021)   Exercise Vital Sign    Days of Exercise per Week: 0 days    Minutes of Exercise per Session: 0 min  Stress: No Stress Concern Present (08/07/2021)   [place]    Feeling of Stress : Not at all  Social Connections: Moderately Isolated (08/07/2021)   Social Connection and Isolation Panel [NHANES]    Frequency of Communication with Friends and Family: More than three times a week    Frequency of Social Gatherings with Friends and Family: More than three times a week    Attends Religious Services: Never    Marine scientist or Organizations: No    Attends Archivist Meetings: Never    Marital Status: Married  Human resources officer Violence: Not At Risk (08/07/2021)   Humiliation, Afraid, Rape, and Kick questionnaire    Fear of Current or Ex-Partner: No    Emotionally Abused: No    Physically Abused: No    Sexually Abused: No    Current Outpatient Medications  Medication Instructions   acetaminophen (TYLENOL) 650 mg, Oral, 2 times daily PRN   ALPRAZolam (XANAX) 0.25 MG tablet TAKE 2 TABLETS(0.5 MG) BY MOUTH AT BEDTIME AS NEEDED FOR SLEEP   calcium carbonate (OS-CAL) 1,200 mg, Oral, Daily   CHONDROITIN SULFATE PO Oral   cyanocobalamin (VITAMIN B12) 1,000 mcg, Oral, Daily   diclofenac Sodium (VOLTAREN) 2 g, Topical, 4 times daily   DILANTIN 100 MG ER capsule TAKE 5 CAPSULES DAILY AS   DIRECTED   ezetimibe (ZETIA) 10 mg, Oral, Daily   fexofenadine (ALLEGRA) 180 mg, Daily   fluocinonide gel (LIDEX) 6.16 % 1 application , Topical, 3 times daily PRN   Melatonin 10 MG TABS Oral   naproxen (NAPROSYN) 500 MG tablet TAKE 1 TABLET(500 MG) BY MOUTH DAILY AS NEEDED FOR MODERATE PAIN   Omega-3 Fatty Acids (FISH OIL) 300 MG CAPS Oral, (Mega Red)    Probiotic Product (PROBIOTIC ADVANCED PO) Oral    rosuvastatin (CRESTOR) 10 mg, Oral, Daily   TURMERIC PO Oral   vitamin C 1,000 mg, Oral, Daily,     Vitamin D3 1,000 Units, Oral, Daily       Objective:   Physical Exam BP 116/72   Pulse (!) 55   Temp 97.8 F (36.6 C) (Oral)   Resp 18   Ht '5\' 10"'$  (1.778 m)   Wt 200 lb 6 oz (90.9 kg)   SpO2 96%   BMI 28.75 kg/m  General: Well developed, NAD, BMI noted Neck: No  thyromegaly  HEENT:  Normocephalic . [name] symmetric, atraumatic Lungs:  CTA B Normal respiratory effort, no intercostal retractions, no accessory muscle use. Heart: RRR,  no murmur.  Abdomen:  Not distended, soft, non-tender. No rebound or rigidity.   Lower extremities: no pretibial edema bilaterally  Skin: Exposed areas without rash. Not pale. Not jaundice Neurologic:  alert & oriented X3.  Speech normal, gait appropriate for age and unassisted Strength symmetric and appropriate for age.  Psych: Cognition and judgment appear intact.  Cooperative with normal attention span and concentration.  Behavior appropriate. No anxious or depressed appearing.     Assessment    Assessment Hyperlipidemia Insomnia-- xanax prn, rx per pcp Allergies Syncope, in the 1980s, on Dilantin since then, used to see Dr. Erling Cruz, reluctant to stop dilantin Elevated LFTs:  Chronic, on-off x years  Work-up:  hepatitis B C -2008, iron, ferritin, alpha-1 antitrypsin normal 2016. CT 4-/2018: Hepatic steatosis.   SPEP 2019 (-).  Likely multifactorial: EtOH, meds. Incomplete RBBB-previously a stress test was negative Osteopenia?   DEXA 07/18/2016 normal Covid infection 05-2020  PLAN: Here for CPX Hyperlipidemia: Excellent response to Crestor, also on Zetia to minimize the statins dose.Check labs. Insomnia: Xanax as needed. Syncope (remote) : On chronic Dilantin, has been very reluctant to stop.  Checking levels. Increased LFTs:  Chronic, on-off x years  Work-up:  hepatitis B C -2008, iron, ferritin, alpha-1 antitrypsin normal 2016.  CT 4-/2018: Hepatic steatosis.   SPEP 2019 (-).    Liver US 02/02/2021: Query gallbladder sludge.  Exam was somewhat limited, hepatic asteatosis Etiology: Likely multifactorial including medications, EtOH (last year he says he drinks 1 or 2 beers at night, today he said "2 or 3"). Many medications are chronic and I believe LFTs elevation preceded the meds.  He is also now taking Tylenol.  Plan: Check LFTs, GI referral.  Further eval versus observation?Marland Kitchen Abnormal CBC: Increased MCV, probably from EtOH, checking vitamins. RTC 6 months

## 2022-01-30 NOTE — Assessment & Plan Note (Signed)
Here for CPX Hyperlipidemia: Excellent response to Crestor, also on Zetia to minimize the statins dose.Check labs. Insomnia: Xanax as needed. Syncope (remote) : On chronic Dilantin, has been very reluctant to stop.  Checking levels. Increased LFTs:  Chronic, on-off x years  Work-up:  hepatitis B C -2008, iron, ferritin, alpha-1 antitrypsin normal 2016. CT 4-/2018: Hepatic steatosis.   SPEP 2019 (-).    Liver US 02/02/2021: Query gallbladder sludge.  Exam was somewhat limited, hepatic asteatosis Etiology: Likely multifactorial including medications, EtOH (last year he says he drinks 1 or 2 beers at night, today he said "2 or 3"). Many medications are chronic and I believe LFTs elevation preceded the meds.  He is also now taking Tylenol.  Plan: Check LFTs, GI referral.  Further eval versus observation?Marland Kitchen Abnormal CBC: Increased MCV, probably from EtOH, checking vitamins. RTC 6 months

## 2022-01-30 NOTE — Assessment & Plan Note (Signed)
-  Td  06-2016 - prevnar:  11/19/2018 - pnm 23: 2021 - s/p shingrex - Covid vax  recommended - RSV d/w pt -  flu shot  today -CCS: Last colonoscopy 2013, neg next due, referred to GI for increased LFTs, patient aware he is also due for a colonoscopy. - Prostate cancer screening: No sxs, DRE normal last year, check PSA.   -labs: CMP, FLP, CBC, Dilantin level, thyroid, B12,folic acid, B1, PSA -Diet and exercise discussed  -Healthcare POA:  discussed

## 2022-01-31 LAB — PHENYTOIN LEVEL, TOTAL: Phenytoin, Total: 15.6 mg/L (ref 10.0–20.0)

## 2022-02-03 LAB — VITAMIN B1: Vitamin B1 (Thiamine): 9 nmol/L (ref 8–30)

## 2022-02-05 ENCOUNTER — Encounter: Payer: Self-pay | Admitting: Gastroenterology

## 2022-02-06 ENCOUNTER — Ambulatory Visit (INDEPENDENT_AMBULATORY_CARE_PROVIDER_SITE_OTHER): Payer: Medicare Other | Admitting: Sports Medicine

## 2022-02-06 VITALS — BP 118/78 | HR 66 | Ht 70.0 in | Wt 200.0 lb

## 2022-02-06 DIAGNOSIS — M170 Bilateral primary osteoarthritis of knee: Secondary | ICD-10-CM | POA: Diagnosis not present

## 2022-02-06 DIAGNOSIS — M25562 Pain in left knee: Secondary | ICD-10-CM

## 2022-02-06 DIAGNOSIS — G8929 Other chronic pain: Secondary | ICD-10-CM

## 2022-02-06 DIAGNOSIS — M25561 Pain in right knee: Secondary | ICD-10-CM

## 2022-02-06 MED ORDER — SODIUM HYALURONATE (VISCOSUP) 16.8 MG/2ML IX SOSY
16.8000 mg | PREFILLED_SYRINGE | Freq: Once | INTRA_ARTICULAR | Status: AC
  Administered 2022-02-06: 16.8 mg via INTRA_ARTICULAR

## 2022-02-06 NOTE — Patient Instructions (Signed)
Good to see you  6 week follow up

## 2022-02-06 NOTE — Progress Notes (Signed)
Kenneth [name] D.Kenneth [name] Kenneth [name]: [phone]   Assessment and Plan:     1. Osteoarthritis of patellofemoral joints of both knees 2. Chronic pain of both knees  -Chronic with exacerbation, subsequent visit - Patient presented today for procedure only bilateral #1 Gelsyn HA injection.  Tolerated well per note below - CSI had been becoming less and less effective for patient with last CSI performed 01/01/2022 only providing a few weeks of relief, so HA was trialed today  Procedure: Knee Joint Injection Side: Bilateral Indication: Flare of osteoarthritis  Risks explained and consent was given verbally. The site was cleaned with alcohol prep. A needle was introduced with an anterio-lateral approach. Injection given using 2 mL of the Gelsyn. This was well tolerated and resulted in symptomatic relief.  Needle was removed, hemostasis achieved, and post injection instructions were explained.  Procedure was repeated on contralateral side.  Pt was advised to call or return to clinic if these symptoms worsen or fail to improve as anticipated.    Pertinent previous records reviewed include none   Follow Up: 1 week for Gelsyn 2 of 3 injection.  Ultimately, if no improvement or worsening of symptoms, would consider a Zilretta versus PRP injections   Subjective:   I, [name], am serving as a Education administrator for [name]   Chief Complaint: bilateral knee pain    HPI:   Pt is a 66 y/o male c/o bilat knee pain, L>R. Pt notes knees have bothered him for years intermittently. Pt locates pain to the anterior aspect of bilat knees. Pt reports the pain is to the point where it's waking him up at night when it is flared.  Currently says that pain is tolerable on a day-to-day basis and is not limiting him from any activities.  Denies falls or trauma.  Denies numbness/tingling/weakness in extremities   12/27/20 Patient  states Last night at 3 am woke up with sharp/aching pain and knees were hot to the touch. Patient used ice packs to help with the pain to fall asleep. Patient states he wasn't any more active than usual, but states that he needs to do something different to help with the pain so he can get back to his regular quality of life.    01/10/21 Patient states that mostly feeling better, still has some popping and cracking in the morning. Patient states that he still is having a tender spot on the top of his knee cap that when it hits something it is very tender. Patient states he had doen a lot of steps recently and he is still doing well.    05/21/21 Patient states that the knee are aching, kneecaps aren't bothering like they were just wants bilateral injections   09/20/2021 Patient states ready for injections was doing really well up until a week ago   01/01/2022 Patient states that he is here for injections at the least     01/24/2022 Patient states are the same if not worst  02/05/2022 Patient states he is ready for some gel    Relevant Historical Information: None pertinent  Additional pertinent review of systems negative.   Current Outpatient Medications:    acetaminophen (TYLENOL) 650 MG CR tablet, Take 650 mg by mouth 2 (two) times daily as needed for pain., Disp: , Rfl:    ALPRAZolam (XANAX) 0.25 MG tablet, TAKE 2 TABLETS(0.5 MG) BY MOUTH AT BEDTIME AS NEEDED FOR SLEEP, Disp: 60  tablet, Rfl: 3   Ascorbic Acid (VITAMIN C) 1000 MG tablet, Take 1,000 mg by mouth daily., Disp: , Rfl:    calcium carbonate (OS-CAL) 600 MG TABS, Take 1,200 mg by mouth daily., Disp: , Rfl:    Cholecalciferol (VITAMIN D3) 2000 UNITS TABS, Take 1,000 Units by mouth daily., Disp: , Rfl:    CHONDROITIN SULFATE PO, Take by mouth., Disp: , Rfl:    cyanocobalamin 500 MCG tablet, Take 1,000 mcg by mouth daily., Disp: , Rfl:    diclofenac Sodium (VOLTAREN) 1 % GEL, Apply 2 g topically 4 (four) times daily., Disp: ,  Rfl:    DILANTIN 100 MG ER capsule, TAKE 5 CAPSULES DAILY AS   DIRECTED, Disp: 450 capsule, Rfl: 2   ezetimibe (ZETIA) 10 MG tablet, Take 1 tablet (10 mg total) by mouth daily., Disp: 90 tablet, Rfl: 1   fexofenadine (ALLEGRA) 180 MG tablet, Take 180 mg by mouth daily., Disp: , Rfl:    fluocinonide gel (LIDEX) 4.14 %, Apply 1 application topically 3 (three) times daily as needed. , Disp: , Rfl:    Melatonin 10 MG TABS, Take by mouth., Disp: , Rfl:    naproxen (NAPROSYN) 500 MG tablet, TAKE 1 TABLET(500 MG) BY MOUTH DAILY AS NEEDED FOR MODERATE PAIN, Disp: 30 tablet, Rfl: 3   Omega-3 Fatty Acids (FISH OIL) 300 MG CAPS, Take by mouth. (Mega Red), Disp: , Rfl:    Probiotic Product (PROBIOTIC ADVANCED PO), Take by mouth., Disp: , Rfl:    rosuvastatin (CRESTOR) 10 MG tablet, Take 1 tablet (10 mg total) by mouth daily., Disp: 90 tablet, Rfl: 1   TURMERIC PO, Take by mouth., Disp: , Rfl:    Objective:     Vitals:   02/06/22 1100  BP: 118/78  Pulse: 66  SpO2: 97%  Weight: 200 lb (90.7 kg)  Height: '5\' 10"'$  (1.778 m)      Body mass index is 28.7 kg/m.        Electronically signed by:  Kenneth [name] D.[facility] 11:16 AM 02/06/22

## 2022-02-12 NOTE — Progress Notes (Unsigned)
Kenneth [name] D.[place] Phone: [phone]   Assessment and Plan:     There are no diagnoses linked to this encounter.  ***   Pertinent previous records reviewed include ***   Follow Up: ***     Subjective:   I,  , am serving as a Education administrator for [name]   Chief Complaint: bilateral knee pain    HPI:   Pt is a 66 y/o male c/o bilat knee pain, L>R. Pt notes knees have bothered him for years intermittently. Pt locates pain to the anterior aspect of bilat knees. Pt reports the pain is to the point where it's waking him up at night when it is flared.  Currently says that pain is tolerable on a day-to-day basis and is not limiting him from any activities.  Denies falls or trauma.  Denies numbness/tingling/weakness in extremities   12/27/20 Patient states Last night at 3 am woke up with sharp/aching pain and knees were hot to the touch. Patient used ice packs to help with the pain to fall asleep. Patient states he wasn't any more active than usual, but states that he needs to do something different to help with the pain so he can get back to his regular quality of life.    01/10/21 Patient states that mostly feeling better, still has some popping and cracking in the morning. Patient states that he still is having a tender spot on the top of his knee cap that when it hits something it is very tender. Patient states he had doen a lot of steps recently and he is still doing well.    05/21/21 Patient states that the knee are aching, kneecaps aren't bothering like they were just wants bilateral injections   09/20/2021 Patient states ready for injections was doing really well up until a week ago   01/01/2022 Patient states that he is here for injections at the least     01/24/2022 Patient states are the same if not worst   02/05/2022 Patient states he is ready for some gel     02/13/2022 Patient states   Relevant Historical Information: None pertinent  Additional pertinent review of systems negative.   Current Outpatient Medications:    acetaminophen (TYLENOL) 650 MG CR tablet, Take 650 mg by mouth 2 (two) times daily as needed for pain., Disp: , Rfl:    ALPRAZolam (XANAX) 0.25 MG tablet, TAKE 2 TABLETS(0.5 MG) BY MOUTH AT BEDTIME AS NEEDED FOR SLEEP, Disp: 60 tablet, Rfl: 3   Ascorbic Acid (VITAMIN C) 1000 MG tablet, Take 1,000 mg by mouth daily., Disp: , Rfl:    calcium carbonate (OS-CAL) 600 MG TABS, Take 1,200 mg by mouth daily., Disp: , Rfl:    Cholecalciferol (VITAMIN D3) 2000 UNITS TABS, Take 1,000 Units by mouth daily., Disp: , Rfl:    CHONDROITIN SULFATE PO, Take by mouth., Disp: , Rfl:    cyanocobalamin 500 MCG tablet, Take 1,000 mcg by mouth daily., Disp: , Rfl:    diclofenac Sodium (VOLTAREN) 1 % GEL, Apply 2 g topically 4 (four) times daily., Disp: , Rfl:    DILANTIN 100 MG ER capsule, TAKE 5 CAPSULES DAILY AS   DIRECTED, Disp: 450 capsule, Rfl: 2   ezetimibe (ZETIA) 10 MG tablet, Take 1 tablet (10 mg total) by mouth daily., Disp: 90 tablet, Rfl: 1   fexofenadine (ALLEGRA) 180 MG tablet, Take 180 mg by mouth daily., Disp: ,  Rfl:    fluocinonide gel (LIDEX) 3.83 %, Apply 1 application topically 3 (three) times daily as needed. , Disp: , Rfl:    Melatonin 10 MG TABS, Take by mouth., Disp: , Rfl:    naproxen (NAPROSYN) 500 MG tablet, TAKE 1 TABLET(500 MG) BY MOUTH DAILY AS NEEDED FOR MODERATE PAIN, Disp: 30 tablet, Rfl: 3   Omega-3 Fatty Acids (FISH OIL) 300 MG CAPS, Take by mouth. (Mega Red), Disp: , Rfl:    Probiotic Product (PROBIOTIC ADVANCED PO), Take by mouth., Disp: , Rfl:    rosuvastatin (CRESTOR) 10 MG tablet, Take 1 tablet (10 mg total) by mouth daily., Disp: 90 tablet, Rfl: 1   TURMERIC PO, Take by mouth., Disp: , Rfl:    Objective:     There were no vitals filed for this visit.    There is no height or weight on file to calculate  BMI.    Physical Exam:    ***   Electronically signed by:  Kenneth [name] D.[facility] 8:03 AM 02/12/22

## 2022-02-13 ENCOUNTER — Ambulatory Visit (INDEPENDENT_AMBULATORY_CARE_PROVIDER_SITE_OTHER): Payer: Medicare Other | Admitting: Sports Medicine

## 2022-02-13 DIAGNOSIS — M170 Bilateral primary osteoarthritis of knee: Secondary | ICD-10-CM | POA: Diagnosis not present

## 2022-02-13 DIAGNOSIS — M25561 Pain in right knee: Secondary | ICD-10-CM

## 2022-02-13 DIAGNOSIS — G8929 Other chronic pain: Secondary | ICD-10-CM

## 2022-02-13 DIAGNOSIS — M25562 Pain in left knee: Secondary | ICD-10-CM

## 2022-02-13 MED ORDER — SODIUM HYALURONATE (VISCOSUP) 16.8 MG/2ML IX SOSY
16.8000 mg | PREFILLED_SYRINGE | Freq: Once | INTRA_ARTICULAR | Status: AC
  Administered 2022-02-13: 16.8 mg via INTRA_ARTICULAR

## 2022-02-13 NOTE — Addendum Note (Signed)
Addended by: [name] R on: 02/13/2022 11:05 AM   Modules accepted: Orders

## 2022-02-19 NOTE — Progress Notes (Signed)
Kenneth [name] D.[place] Phone: [phone]   Assessment and Plan:     There are no diagnoses linked to this encounter.  ***   Pertinent previous records reviewed include ***   Follow Up: ***     Subjective:   I,  , am serving as a Education administrator for [name]   Chief Complaint: bilateral knee pain    HPI:   Pt is a 66 y/o male c/o bilat knee pain, L>R. Pt notes knees have bothered him for years intermittently. Pt locates pain to the anterior aspect of bilat knees. Pt reports the pain is to the point where it's waking him up at night when it is flared.  Currently says that pain is tolerable on a day-to-day basis and is not limiting him from any activities.  Denies falls or trauma.  Denies numbness/tingling/weakness in extremities   12/27/20 Patient states Last night at 3 am woke up with sharp/aching pain and knees were hot to the touch. Patient used ice packs to help with the pain to fall asleep. Patient states he wasn't any more active than usual, but states that he needs to do something different to help with the pain so he can get back to his regular quality of life.    01/10/21 Patient states that mostly feeling better, still has some popping and cracking in the morning. Patient states that he still is having a tender spot on the top of his knee cap that when it hits something it is very tender. Patient states he had doen a lot of steps recently and he is still doing well.    05/21/21 Patient states that the knee are aching, kneecaps aren't bothering like they were just wants bilateral injections   09/20/2021 Patient states ready for injections was doing really well up until a week ago   01/01/2022 Patient states that he is here for injections at the least     01/24/2022 Patient states are the same if not worst   02/05/2022 Patient states he is ready for some gel     02/13/2022 Patient states he is ready for second gel injection.  02/20/2022 Patient states   Relevant Historical Information: None pertinent Additional pertinent review of systems negative.   Current Outpatient Medications:    acetaminophen (TYLENOL) 650 MG CR tablet, Take 650 mg by mouth 2 (two) times daily as needed for pain., Disp: , Rfl:    ALPRAZolam (XANAX) 0.25 MG tablet, TAKE 2 TABLETS(0.5 MG) BY MOUTH AT BEDTIME AS NEEDED FOR SLEEP, Disp: 60 tablet, Rfl: 3   Ascorbic Acid (VITAMIN C) 1000 MG tablet, Take 1,000 mg by mouth daily., Disp: , Rfl:    calcium carbonate (OS-CAL) 600 MG TABS, Take 1,200 mg by mouth daily., Disp: , Rfl:    Cholecalciferol (VITAMIN D3) 2000 UNITS TABS, Take 1,000 Units by mouth daily., Disp: , Rfl:    CHONDROITIN SULFATE PO, Take by mouth., Disp: , Rfl:    cyanocobalamin 500 MCG tablet, Take 1,000 mcg by mouth daily., Disp: , Rfl:    diclofenac Sodium (VOLTAREN) 1 % GEL, Apply 2 g topically 4 (four) times daily., Disp: , Rfl:    DILANTIN 100 MG ER capsule, TAKE 5 CAPSULES DAILY AS   DIRECTED, Disp: 450 capsule, Rfl: 2   ezetimibe (ZETIA) 10 MG tablet, Take 1 tablet (10 mg total) by mouth daily., Disp: 90 tablet, Rfl: 1   fexofenadine (ALLEGRA) 180  MG tablet, Take 180 mg by mouth daily., Disp: , Rfl:    fluocinonide gel (LIDEX) 0.10 %, Apply 1 application topically 3 (three) times daily as needed. , Disp: , Rfl:    Melatonin 10 MG TABS, Take by mouth., Disp: , Rfl:    naproxen (NAPROSYN) 500 MG tablet, TAKE 1 TABLET(500 MG) BY MOUTH DAILY AS NEEDED FOR MODERATE PAIN, Disp: 30 tablet, Rfl: 3   Omega-3 Fatty Acids (FISH OIL) 300 MG CAPS, Take by mouth. (Mega Red), Disp: , Rfl:    Probiotic Product (PROBIOTIC ADVANCED PO), Take by mouth., Disp: , Rfl:    rosuvastatin (CRESTOR) 10 MG tablet, Take 1 tablet (10 mg total) by mouth daily., Disp: 90 tablet, Rfl: 1   TURMERIC PO, Take by mouth., Disp: , Rfl:    Objective:     There were no vitals filed for this  visit.    There is no height or weight on file to calculate BMI.    Physical Exam:    ***   Electronically signed by:  Kenneth [name] D.[facility] 12:43 PM 02/19/22

## 2022-02-20 ENCOUNTER — Ambulatory Visit (INDEPENDENT_AMBULATORY_CARE_PROVIDER_SITE_OTHER): Payer: Medicare Other | Admitting: Sports Medicine

## 2022-02-20 DIAGNOSIS — M170 Bilateral primary osteoarthritis of knee: Secondary | ICD-10-CM

## 2022-02-20 DIAGNOSIS — G8929 Other chronic pain: Secondary | ICD-10-CM

## 2022-02-20 DIAGNOSIS — M25562 Pain in left knee: Secondary | ICD-10-CM

## 2022-02-20 DIAGNOSIS — M25561 Pain in right knee: Secondary | ICD-10-CM

## 2022-02-20 MED ORDER — SODIUM HYALURONATE (VISCOSUP) 16.8 MG/2ML IX SOSY
16.8000 mg | PREFILLED_SYRINGE | Freq: Once | INTRA_ARTICULAR | Status: AC
  Administered 2022-02-20: 16.8 mg via INTRA_ARTICULAR

## 2022-03-07 ENCOUNTER — Other Ambulatory Visit: Payer: Self-pay | Admitting: Internal Medicine

## 2022-03-07 ENCOUNTER — Other Ambulatory Visit (INDEPENDENT_AMBULATORY_CARE_PROVIDER_SITE_OTHER): Payer: Medicare Other

## 2022-03-07 ENCOUNTER — Ambulatory Visit (INDEPENDENT_AMBULATORY_CARE_PROVIDER_SITE_OTHER): Payer: Medicare Other | Admitting: Gastroenterology

## 2022-03-07 ENCOUNTER — Encounter: Payer: Self-pay | Admitting: Gastroenterology

## 2022-03-07 VITALS — BP 138/74 | HR 72 | Ht 70.0 in | Wt 203.5 lb

## 2022-03-07 DIAGNOSIS — R7989 Other specified abnormal findings of blood chemistry: Secondary | ICD-10-CM | POA: Diagnosis not present

## 2022-03-07 DIAGNOSIS — K760 Fatty (change of) liver, not elsewhere classified: Secondary | ICD-10-CM | POA: Diagnosis not present

## 2022-03-07 DIAGNOSIS — Z1211 Encounter for screening for malignant neoplasm of colon: Secondary | ICD-10-CM

## 2022-03-07 DIAGNOSIS — Z789 Other specified health status: Secondary | ICD-10-CM

## 2022-03-07 LAB — IBC + FERRITIN
Ferritin: 181.7 ng/mL (ref 22.0–322.0)
Iron: 150 ug/dL (ref 42–165)
Saturation Ratios: 49.4 % (ref 20.0–50.0)
TIBC: 303.8 ug/dL (ref 250.0–450.0)
Transferrin: 217 mg/dL (ref 212.0–360.0)

## 2022-03-07 LAB — HEPATIC FUNCTION PANEL
ALT: 98 U/L — ABNORMAL HIGH (ref 0–53)
AST: 64 U/L — ABNORMAL HIGH (ref 0–37)
Albumin: 4.4 g/dL (ref 3.5–5.2)
Alkaline Phosphatase: 86 U/L (ref 39–117)
Bilirubin, Direct: 0.1 mg/dL (ref 0.0–0.3)
Total Bilirubin: 0.3 mg/dL (ref 0.2–1.2)
Total Protein: 6.8 g/dL (ref 6.0–8.3)

## 2022-03-07 NOTE — Patient Instructions (Signed)
_______________________________________________________  If you are age 68 or older, your body mass index should be between 23-30. Your Body mass index is 29.2 kg/m. If this is out of the aforementioned range listed, please consider follow up with your Primary Care Provider.  If you are age 62 or younger, your body mass index should be between 19-25. Your Body mass index is 29.2 kg/m. If this is out of the aformentioned range listed, please consider follow up with your Primary Care Provider.   ________________________________________________________  The [facility] providers would like to encourage you to use [facility] to communicate with providers for non-urgent requests or questions.  Due to long hold times on the telephone, sending your provider a message by [facility] may be a faster and more efficient way to get a response.  Please allow 48 business hours for a response.  Please remember that this is for non-urgent requests.  _______________________________________________________  Your provider has requested that you go to the basement level for lab work before leaving today. Press "B" on the elevator. The lab is located at the first door on the left as you exit the elevator.   You have been scheduled for a colonoscopy. Please follow written instructions given to you at your visit today.  Please pick up your prep supplies at the pharmacy within the next 1-3 days. If you use inhalers (even only as needed), please bring them with you on the day of your procedure.

## 2022-03-07 NOTE — Progress Notes (Signed)
03/07/2022 Kenneth [name] [id number] 1955-05-20   HISTORY OF PRESENT ILLNESS: This is a pleasant 66 year old male who is a patient of Dr. Celesta Aver known him for colonoscopy back in 2013 at which time he was found to have only internal and external hemorrhoids with a repeat in 10 years recommended.  He is now here at the request of his PCP, Dr. Larose Kells, for evaluation regarding elevated liver function test and also for repeat screening colonoscopy.  He does not have any GI complaints.  Says he moves his bowels regularly without any rectal bleeding.  He does drink 3 beers daily for several years.  Has fatty liver on ultrasound from last year.  AST 70, ALT 104, normal total bili and alk phos.  1 year ago AST was 59 and ALT was 105.  CBC with normal hemoglobin and platelets.  He tells me that he had been taking a lot of NSAIDs trying to treat some knee pain, but now is getting injections and trying some other methods.  He was not sure if that had anything to do with his elevated liver function tests.  No family history of liver disease to his knowledge.     Past Medical History:  Diagnosis Date   Allergy    Hx of colonic polyp 2007   Hyperlipidemia    Insomnia 06/02/2014   Syncope    in 20s; no etiology, no recurrence. Dilantin Rxed   Past Surgical History:  Procedure Laterality Date   colonoscopy with polypectomy  2007   Dr Carlean Purl, 4 mm rectal polyp   DENTAL SURGERY     bone grafts, to get implants   NASAL FRACTURE SURGERY     TONSILLECTOMY AND ADENOIDECTOMY      reports that he has never smoked. He has never used smokeless tobacco. He reports current alcohol use of about 6.0 - 8.0 standard drinks of alcohol per week. He reports current drug use. Drug: PCP. family history includes Cancer in his mother; Macular degeneration in his father and paternal uncle; Rectal cancer in an other family member. No Known Allergies    Outpatient Encounter Medications as of 03/07/2022   Medication Sig   acetaminophen (TYLENOL) 650 MG CR tablet Take 650 mg by mouth 2 (two) times daily as needed for pain.   ALPRAZolam (XANAX) 0.25 MG tablet TAKE 2 TABLETS(0.5 MG) BY MOUTH AT BEDTIME AS NEEDED FOR SLEEP   Ascorbic Acid (VITAMIN C) 1000 MG tablet Take 1,000 mg by mouth daily.   calcium carbonate (OS-CAL) 600 MG TABS Take 1,200 mg by mouth daily.   Cholecalciferol (VITAMIN D3) 2000 UNITS TABS Take 1,000 Units by mouth daily.   cyanocobalamin 500 MCG tablet Take 1,000 mcg by mouth daily.   diclofenac Sodium (VOLTAREN) 1 % GEL Apply 2 g topically 4 (four) times daily.   DILANTIN 100 MG ER capsule TAKE 5 CAPSULES DAILY AS   DIRECTED   ezetimibe (ZETIA) 10 MG tablet Take 1 tablet (10 mg total) by mouth daily.   fexofenadine (ALLEGRA) 180 MG tablet Take 180 mg by mouth daily.   fluocinonide gel (LIDEX) 7.82 % Apply 1 application topically 3 (three) times daily as needed.    Melatonin 10 MG TABS Take by mouth.   naproxen (NAPROSYN) 500 MG tablet TAKE 1 TABLET(500 MG) BY MOUTH DAILY AS NEEDED FOR MODERATE PAIN   Omega-3 Fatty Acids (FISH OIL) 300 MG CAPS Take by mouth. (Mega Red)   Probiotic Product (PROBIOTIC ADVANCED PO) Take by  mouth.   rosuvastatin (CRESTOR) 10 MG tablet Take 1 tablet (10 mg total) by mouth daily.   TURMERIC PO Take by mouth.   CHONDROITIN SULFATE PO Take by mouth. (Patient not taking: Reported on 03/07/2022)   No facility-administered encounter medications on file as of 03/07/2022.     REVIEW OF SYSTEMS  : All other systems reviewed and negative except where noted in the History of Present Illness.   PHYSICAL EXAM: BP 138/74   Pulse 72   Ht _0  (1.778 m)   Wt 203 lb 8 oz (92.3 kg)   BMI 29.20 kg/m  General: Well developed white male in no acute distress Head: Normocephalic and atraumatic Eyes:  Sclerae anicteric, conjunctiva pink. Ears: Normal auditory acuity Lungs: Clear throughout to auscultation; no W/R/R. Heart: Regular rate and rhythm; no  M/R/G. Abdomen: Soft, non-distended.  BS present.  Non-tender. Rectal:  Will be done at the time of colonoscopy. Musculoskeletal: Symmetrical with no gross deformities  Skin: No lesions on visible extremities Extremities: No edema  Neurological: Alert oriented x 4, grossly non-focal Psychological:  Alert and cooperative. Normal mood and affect  ASSESSMENT AND PLAN: *Colorectal cancer screening: Last colonoscopy in 2013.  We will schedule with Dr. Carlean Purl.  The risks, benefits, and alternatives to colonoscopy were discussed with the patient and he consents to proceed.  *Elevated LFTs: They have been elevated to some degree for several years.  About stable this year compared to last year.  Has fatty liver on ultrasound that was performed last year.  Drinks about 3 beers daily for a long time.  This is likely contributing as well.  We discussed good diet, exercise, weight loss, good blood sugar and cholesterol control, and reducing alcohol intake.  We will perform some of the other liver serologies just to rule out other concha mitten causes of chronic liver disease.    CC:  Colon Branch, MD

## 2022-03-12 LAB — MITOCHONDRIAL ANTIBODIES: Mitochondrial M2 Ab, IgG: <=20 U (ref <=20.0)

## 2022-03-12 LAB — IGG: IgG (Immunoglobin G), Serum: 720 mg/dL (ref 600–1540)

## 2022-03-12 LAB — HEPATITIS C ANTIBODY: Hepatitis C Ab: NONREACTIVE

## 2022-03-12 LAB — ANA: Anti Nuclear Antibody (ANA): NEGATIVE

## 2022-03-12 LAB — HEPATITIS B SURFACE ANTIGEN: Hepatitis B Surface Ag: NONREACTIVE

## 2022-03-12 LAB — HEPATITIS B SURFACE ANTIBODY,QUALITATIVE: Hep B S Ab: NONREACTIVE

## 2022-03-12 LAB — ANTI-SMOOTH MUSCLE ANTIBODY, IGG: Actin (Smooth Muscle) Antibody (IGG): <20 U (ref <20)

## 2022-03-12 LAB — HEPATITIS A ANTIBODY, TOTAL: Hepatitis A AB,Total: NONREACTIVE

## 2022-03-12 NOTE — Progress Notes (Unsigned)
Kenneth [name] D.[place] Phone: [phone]   Assessment and Plan:     There are no diagnoses linked to this encounter.  ***   Pertinent previous records reviewed include ***   Follow Up: ***     Subjective:   I,  , am serving as a Education administrator for [name]   Chief Complaint: bilateral knee pain    HPI:   Pt is a 66 y/o male c/o bilat knee pain, L>R. Pt notes knees have bothered him for years intermittently. Pt locates pain to the anterior aspect of bilat knees. Pt reports the pain is to the point where it's waking him up at night when it is flared.  Currently says that pain is tolerable on a day-to-day basis and is not limiting him from any activities.  Denies falls or trauma.  Denies numbness/tingling/weakness in extremities   12/27/20 Patient states Last night at 3 am woke up with sharp/aching pain and knees were hot to the touch. Patient used ice packs to help with the pain to fall asleep. Patient states he wasn't any more active than usual, but states that he needs to do something different to help with the pain so he can get back to his regular quality of life.    01/10/21 Patient states that mostly feeling better, still has some popping and cracking in the morning. Patient states that he still is having a tender spot on the top of his knee cap that when it hits something it is very tender. Patient states he had doen a lot of steps recently and he is still doing well.    05/21/21 Patient states that the knee are aching, kneecaps aren't bothering like they were just wants bilateral injections   09/20/2021 Patient states ready for injections was doing really well up until a week ago   01/01/2022 Patient states that he is here for injections at the least     01/24/2022 Patient states are the same if not worst   02/05/2022 Patient states he is ready for some gel     02/13/2022 Patient states he is ready for second gel injection.   02/20/22 Patient presents for procedure only third HA injection.  03/13/2022 Patient states  Relevant Historical Information: None pertinent  Additional pertinent review of systems negative.   Current Outpatient Medications:    acetaminophen (TYLENOL) 650 MG CR tablet, Take 650 mg by mouth 2 (two) times daily as needed for pain., Disp: , Rfl:    ALPRAZolam (XANAX) 0.25 MG tablet, TAKE 2 TABLETS(0.5 MG) BY MOUTH AT BEDTIME AS NEEDED FOR SLEEP, Disp: 60 tablet, Rfl: 3   Ascorbic Acid (VITAMIN C) 1000 MG tablet, Take 1,000 mg by mouth daily., Disp: , Rfl:    calcium carbonate (OS-CAL) 600 MG TABS, Take 1,200 mg by mouth daily., Disp: , Rfl:    Cholecalciferol (VITAMIN D3) 2000 UNITS TABS, Take 1,000 Units by mouth daily., Disp: , Rfl:    CHONDROITIN SULFATE PO, Take by mouth. (Patient not taking: Reported on 03/07/2022), Disp: , Rfl:    cyanocobalamin 500 MCG tablet, Take 1,000 mcg by mouth daily., Disp: , Rfl:    diclofenac Sodium (VOLTAREN) 1 % GEL, Apply 2 g topically 4 (four) times daily., Disp: , Rfl:    DILANTIN 100 MG ER capsule, TAKE 5 CAPSULES DAILY AS   DIRECTED, Disp: 450 capsule, Rfl: 1   ezetimibe (ZETIA) 10 MG tablet, Take 1  tablet (10 mg total) by mouth daily., Disp: 90 tablet, Rfl: 1   fexofenadine (ALLEGRA) 180 MG tablet, Take 180 mg by mouth daily., Disp: , Rfl:    fluocinonide gel (LIDEX) 1.61 %, Apply 1 application topically 3 (three) times daily as needed. , Disp: , Rfl:    Melatonin 10 MG TABS, Take by mouth., Disp: , Rfl:    naproxen (NAPROSYN) 500 MG tablet, TAKE 1 TABLET(500 MG) BY MOUTH DAILY AS NEEDED FOR MODERATE PAIN, Disp: 30 tablet, Rfl: 3   Omega-3 Fatty Acids (FISH OIL) 300 MG CAPS, Take by mouth. (Mega Red), Disp: , Rfl:    Probiotic Product (PROBIOTIC ADVANCED PO), Take by mouth., Disp: , Rfl:    rosuvastatin (CRESTOR) 10 MG tablet, Take 1 tablet (10 mg total) by mouth daily., Disp: 90  tablet, Rfl: 1   TURMERIC PO, Take by mouth., Disp: , Rfl:    Objective:     There were no vitals filed for this visit.    There is no height or weight on file to calculate BMI.    Physical Exam:    ***   Electronically signed by:  Kenneth [name] D.[facility] 7:33 AM 03/12/22

## 2022-03-13 ENCOUNTER — Ambulatory Visit (INDEPENDENT_AMBULATORY_CARE_PROVIDER_SITE_OTHER): Payer: Medicare Other | Admitting: Sports Medicine

## 2022-03-13 VITALS — HR 51 | Ht 70.0 in | Wt 203.0 lb

## 2022-03-13 DIAGNOSIS — M170 Bilateral primary osteoarthritis of knee: Secondary | ICD-10-CM

## 2022-03-13 DIAGNOSIS — G8929 Other chronic pain: Secondary | ICD-10-CM | POA: Diagnosis not present

## 2022-03-13 DIAGNOSIS — M25562 Pain in left knee: Secondary | ICD-10-CM

## 2022-03-13 DIAGNOSIS — M25561 Pain in right knee: Secondary | ICD-10-CM | POA: Diagnosis not present

## 2022-03-13 NOTE — Patient Instructions (Signed)
Good to see you  Recommend physical activities elliptical, stationary bike , water aerobics Follow up in February if pain returns call and ask for zillretta injection

## 2022-04-03 ENCOUNTER — Telehealth: Payer: Self-pay | Admitting: *Deleted

## 2022-04-03 ENCOUNTER — Other Ambulatory Visit: Payer: Self-pay | Admitting: Sports Medicine

## 2022-04-03 NOTE — Telephone Encounter (Signed)
Prior auth initiated.

## 2022-04-03 NOTE — Progress Notes (Deleted)
Kenneth [name] D.[place] Phone: [phone]   Assessment and Plan:     There are no diagnoses linked to this encounter.  ***   Pertinent previous records reviewed include ***   Follow Up: ***     Subjective:   I,  , am serving as a Education administrator for [name]   Chief Complaint: bilateral knee pain    HPI:   Pt is a 66 y/o male c/o bilat knee pain, L>R. Pt notes knees have bothered him for years intermittently. Pt locates pain to the anterior aspect of bilat knees. Pt reports the pain is to the point where it's waking him up at night when it is flared.  Currently says that pain is tolerable on a day-to-day basis and is not limiting him from any activities.  Denies falls or trauma.  Denies numbness/tingling/weakness in extremities   12/27/20 Patient states Last night at 3 am woke up with sharp/aching pain and knees were hot to the touch. Patient used ice packs to help with the pain to fall asleep. Patient states he wasn't any more active than usual, but states that he needs to do something different to help with the pain so he can get back to his regular quality of life.    01/10/21 Patient states that mostly feeling better, still has some popping and cracking in the morning. Patient states that he still is having a tender spot on the top of his knee cap that when it hits something it is very tender. Patient states he had doen a lot of steps recently and he is still doing well.    05/21/21 Patient states that the knee are aching, kneecaps aren't bothering like they were just wants bilateral injections   09/20/2021 Patient states ready for injections was doing really well up until a week ago   01/01/2022 Patient states that he is here for injections at the least     01/24/2022 Patient states are the same if not worst   02/05/2022 Patient states he is ready for some gel     02/13/2022 Patient states he is ready for second gel injection.   02/20/22 Patient presents for procedure only third HA injection.   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   Current Outpatient Medications:    acetaminophen (TYLENOL) 650 MG CR tablet, Take 650 mg by mouth 2 (two) times daily as needed for pain., Disp: , Rfl:    ALPRAZolam (XANAX) 0.25 MG tablet, TAKE 2 TABLETS(0.5 MG) BY MOUTH AT BEDTIME AS NEEDED FOR SLEEP, Disp: 60 tablet, Rfl: 3   Ascorbic Acid (VITAMIN C) 1000 MG tablet, Take 1,000 mg by mouth daily., Disp: , Rfl:    calcium carbonate (OS-CAL) 600 MG TABS, Take 1,200 mg by mouth daily., Disp: , Rfl:    Cholecalciferol (VITAMIN D3) 2000 UNITS TABS, Take 1,000 Units by mouth daily., Disp: , Rfl:    CHONDROITIN SULFATE PO, Take by mouth., Disp: , Rfl:    cyanocobalamin 500 MCG tablet, Take 1,000 mcg by mouth daily., Disp: , Rfl:    diclofenac Sodium (VOLTAREN) 1 % GEL, Apply 2 g topically 4 (four) times daily., Disp: ,  Rfl:    DILANTIN 100 MG ER capsule, TAKE 5 CAPSULES DAILY AS   DIRECTED, Disp: 450 capsule, Rfl: 1   ezetimibe (ZETIA) 10 MG tablet, Take 1 tablet (10 mg total) by mouth daily., Disp: 90 tablet, Rfl: 1   fexofenadine (ALLEGRA) 180 MG tablet, Take 180 mg by mouth daily., Disp: , Rfl:    fluocinonide gel (LIDEX) 4.69 %, Apply 1 application topically 3 (three) times daily as needed. , Disp: , Rfl:    Melatonin 10 MG TABS, Take by mouth., Disp: , Rfl:    naproxen (NAPROSYN) 500 MG tablet, TAKE 1 TABLET(500 MG) BY MOUTH DAILY AS NEEDED FOR MODERATE PAIN, Disp: 30 tablet, Rfl: 3   Omega-3 Fatty Acids (FISH OIL) 300 MG CAPS, Take by mouth. (Mega Red), Disp: , Rfl:    Probiotic Product (PROBIOTIC  ADVANCED PO), Take by mouth., Disp: , Rfl:    rosuvastatin (CRESTOR) 10 MG tablet, Take 1 tablet (10 mg total) by mouth daily., Disp: 90 tablet, Rfl: 1   TURMERIC PO, Take by mouth., Disp: , Rfl:    Objective:     There were no vitals filed for this visit.    There is no height or weight on file to calculate BMI.    Physical Exam:    ***   Electronically signed by:  Kenneth [name] D.[facility] 11:34 AM 04/03/22

## 2022-04-03 NOTE — Telephone Encounter (Signed)
Bilateral Zilretta initiated through portal

## 2022-04-04 ENCOUNTER — Ambulatory Visit: Payer: Federal, State, Local not specified - PPO | Admitting: Sports Medicine

## 2022-04-05 ENCOUNTER — Ambulatory Visit: Payer: Federal, State, Local not specified - PPO | Admitting: Sports Medicine

## 2022-04-05 ENCOUNTER — Ambulatory Visit (INDEPENDENT_AMBULATORY_CARE_PROVIDER_SITE_OTHER): Payer: Medicare Other | Admitting: Sports Medicine

## 2022-04-05 DIAGNOSIS — G8929 Other chronic pain: Secondary | ICD-10-CM | POA: Diagnosis not present

## 2022-04-05 DIAGNOSIS — M170 Bilateral primary osteoarthritis of knee: Secondary | ICD-10-CM | POA: Diagnosis not present

## 2022-04-05 DIAGNOSIS — M25562 Pain in left knee: Secondary | ICD-10-CM

## 2022-04-05 DIAGNOSIS — M25561 Pain in right knee: Secondary | ICD-10-CM

## 2022-04-05 MED ORDER — TRIAMCINOLONE ACETONIDE 32 MG IX SRER
32.0000 mg | Freq: Once | INTRA_ARTICULAR | Status: AC
  Administered 2022-04-05: 32 mg via INTRA_ARTICULAR

## 2022-04-05 NOTE — Progress Notes (Signed)
Kenneth [name] D.[place] Phone: [phone]   Assessment and Plan:     1. Osteoarthritis of patellofemoral joints of both knees 2. Chronic pain of both knees -Chronic with exacerbation, subsequent visit - Patient had minimal relief after Gelsyn HA injections x 3 and is overall had worsening pain over the past 1 month - Patient presents today requesting bilateral Zilretta injections.  Tolerated well per note below.  Our previous plan was to try and wait until closer to patient's trip to [place] in February to have injections, however with worsening pain, he felt that he could not wait that long - Continue HEP and physical activities including low impact activities  Procedure: Knee Joint Injection Side: Bilateral Indication: Flare of osteoarthritis  Risks explained and consent was given verbally. The site was cleaned with alcohol prep. A needle was introduced with an anterio-lateral approach. Injection given using Zilretta 32 mg  . This was well tolerated and resulted in symptomatic relief.  Needle was removed, hemostasis achieved, and post injection instructions were explained.  Procedure was repeated on contralateral side.  Pt was advised to call or return to clinic if these symptoms worsen or fail to improve as anticipated.   Other orders - Triamcinolone Acetonide (ZILRETTA) intra-articular injection 32 mg - Triamcinolone Acetonide (ZILRETTA) intra-articular injection 32 mg    Pertinent previous records reviewed include none   Follow Up: 4 weeks for reevaluation.  If Zilretta is effective, it could be repeated every 3 months.  If no improvement or worsening of symptoms, would consider MRI of these versus PRP   Subjective:   I, Kenneth [name], am serving as a Education administrator for [name]   Chief Complaint: bilateral knee pain    HPI:   Pt is a 66 y/o male c/o bilat knee pain, L>R. Pt notes knees  have bothered him for years intermittently. Pt locates pain to the anterior aspect of bilat knees. Pt reports the pain is to the point where it's waking him up at night when it is flared.  Currently says that pain is tolerable on a day-to-day basis and is not limiting him from any activities.  Denies falls or trauma.  Denies numbness/tingling/weakness in extremities   12/27/20 Patient states Last night at 3 am woke up with sharp/aching pain and knees were hot to the touch. Patient used ice packs to help with the pain to fall asleep. Patient states he wasn't any more active than usual, but states that he needs to do something different to help with the pain so he can get back to his regular quality of life.    01/10/21 Patient states that mostly feeling better, still has some popping and cracking in the morning. Patient states that he still is having a tender spot on the top of his knee cap that when it hits something it is very tender. Patient states he had doen a lot of steps recently and he is still doing well.    05/21/21 Patient states that the knee are aching, kneecaps aren't bothering like they were just wants bilateral injections   09/20/2021 Patient states ready for injections was doing really well up until a week ago   01/01/2022 Patient states that he is here for injections at the least     01/24/2022 Patient states are the same if not worst   02/05/2022 Patient states he is ready for some gel    02/13/2022 Patient states  he is ready for second gel injection.   02/20/22 Patient presents for procedure only third HA injection.   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   Current Outpatient Medications:     acetaminophen (TYLENOL) 650 MG CR tablet, Take 650 mg by mouth 2 (two) times daily as needed for pain., Disp: , Rfl:    ALPRAZolam (XANAX) 0.25 MG tablet, TAKE 2 TABLETS(0.5 MG) BY MOUTH AT BEDTIME AS NEEDED FOR SLEEP, Disp: 60 tablet, Rfl: 3   Ascorbic Acid (VITAMIN C) 1000 MG tablet, Take 1,000 mg by mouth daily., Disp: , Rfl:    calcium carbonate (OS-CAL) 600 MG TABS, Take 1,200 mg by mouth daily., Disp: , Rfl:    Cholecalciferol (VITAMIN D3) 2000 UNITS TABS, Take 1,000 Units by mouth daily., Disp: , Rfl:    CHONDROITIN SULFATE PO, Take by mouth., Disp: , Rfl:    cyanocobalamin 500 MCG tablet, Take 1,000 mcg by mouth daily., Disp: , Rfl:    diclofenac Sodium (VOLTAREN) 1 % GEL, Apply 2 g topically 4 (four) times daily., Disp: , Rfl:    DILANTIN 100 MG ER capsule, TAKE 5 CAPSULES DAILY AS   DIRECTED, Disp: 450 capsule, Rfl: 1   ezetimibe (ZETIA) 10 MG tablet, Take 1 tablet (10 mg total) by mouth daily., Disp: 90 tablet, Rfl: 1   fexofenadine (ALLEGRA) 180 MG tablet, Take 180 mg by mouth daily., Disp: , Rfl:    fluocinonide gel (LIDEX) 0.09 %, Apply 1 application topically 3 (three) times daily as needed. , Disp: , Rfl:    Melatonin 10 MG TABS, Take by mouth., Disp: , Rfl:    naproxen (NAPROSYN) 500 MG tablet, TAKE 1 TABLET(500 MG) BY MOUTH DAILY AS NEEDED FOR MODERATE PAIN, Disp: 30 tablet, Rfl: 3   Omega-3 Fatty Acids (FISH OIL) 300 MG CAPS, Take by mouth. (Mega Red), Disp: , Rfl:    Probiotic Product (PROBIOTIC ADVANCED PO), Take by mouth., Disp: , Rfl:    rosuvastatin (CRESTOR) 10 MG tablet, Take 1 tablet (10 mg total) by mouth daily., Disp: 90 tablet, Rfl: 1   TURMERIC PO, Take by mouth., Disp: , Rfl:    Objective:     There were no vitals filed for this visit.    There is no height or weight on file to calculate BMI.    Physical Exam:    General:  awake, alert oriented, no acute distress nontoxic Skin: no suspicious lesions or rashes Neuro:sensation intact, no deficits, strength  5/5 with no deficits, no atrophy, normal muscle tone Psych: No signs of anxiety, depression or other mood disorder   Bilateral knee: No swelling No deformity Neg fluid wave, joint milking ROM Flex 100, Ext 5 TTP mildly medial and lateral joint line NTTP over the quad tendon, medial fem condyle, lat fem condyle, patella, plica, patella tendon, tibial tuberostiy, fibular head, posterior fossa, pes anserine bursa, gerdy's tubercle,   Neg anterior and posterior drawer Neg lachman Neg sag sign Negative varus stress Negative valgus stress Negative Mc[place]   Electronically signed by:  Kenneth [name] D.[facility] 12:08 PM 04/05/22

## 2022-04-09 ENCOUNTER — Encounter: Payer: Self-pay | Admitting: Internal Medicine

## 2022-04-09 NOTE — Telephone Encounter (Signed)
Bilateral Zilretta approved.

## 2022-04-18 ENCOUNTER — Encounter: Payer: Self-pay | Admitting: Internal Medicine

## 2022-04-18 ENCOUNTER — Ambulatory Visit (AMBULATORY_SURGERY_CENTER): Payer: Medicare Other | Admitting: Internal Medicine

## 2022-04-18 VITALS — BP 123/57 | HR 43 | Temp 96.8°F | Resp 13 | Ht 70.0 in | Wt 203.0 lb

## 2022-04-18 DIAGNOSIS — D120 Benign neoplasm of cecum: Secondary | ICD-10-CM | POA: Diagnosis not present

## 2022-04-18 DIAGNOSIS — K644 Residual hemorrhoidal skin tags: Secondary | ICD-10-CM

## 2022-04-18 DIAGNOSIS — Z1211 Encounter for screening for malignant neoplasm of colon: Secondary | ICD-10-CM

## 2022-04-18 DIAGNOSIS — K5730 Diverticulosis of large intestine without perforation or abscess without bleeding: Secondary | ICD-10-CM | POA: Diagnosis not present

## 2022-04-18 DIAGNOSIS — K648 Other hemorrhoids: Secondary | ICD-10-CM

## 2022-04-18 MED ORDER — SODIUM CHLORIDE 0.9 % IV SOLN: 500.0000 mL | Freq: Once | INTRAVENOUS | Status: DC

## 2022-04-18 NOTE — Progress Notes (Signed)
[facility] History and Physical   Primary Care Physician:  Colon Branch, MD   Reason for Procedure:   CRCA screening  Plan:    colonoscopy     HPI: Kenneth Wasco. is a 66 y.o. male here for screening exam - negative colonoscopy was performed 2013 and 2007 diminutive polyp removed and not retrieved   Past Medical History:  Diagnosis Date   Allergy    Hx of colonic polyp 2007   Hyperlipidemia    Insomnia 06/02/2014   Syncope    in 20s; no etiology, no recurrence. Dilantin Rxed    Past Surgical History:  Procedure Laterality Date   colonoscopy with polypectomy  2007   Dr Carlean Purl, 4 mm rectal polyp   DENTAL SURGERY     bone grafts, to get implants   NASAL FRACTURE SURGERY     TONSILLECTOMY AND ADENOIDECTOMY      Prior to Admission medications   Medication Sig Start Date End Date Taking? Authorizing Provider  acetaminophen (TYLENOL) 650 MG CR tablet Take 650 mg by mouth 2 (two) times daily as needed for pain.   Yes [provider]  ALPRAZolam (XANAX) 0.25 MG tablet TAKE 2 TABLETS(0.5 MG) BY MOUTH AT BEDTIME AS NEEDED FOR SLEEP 01/17/22  Yes Colon Branch, MD  Ascorbic Acid (VITAMIN C) 1000 MG tablet Take 1,000 mg by mouth daily.   Yes [provider]  calcium carbonate (OS-CAL) 600 MG TABS Take 1,200 mg by mouth daily.   Yes [provider]  DILANTIN 100 MG ER capsule TAKE 5 CAPSULES DAILY AS   DIRECTED 03/07/22  Yes Paz, Alda Berthold, MD  ezetimibe (ZETIA) 10 MG tablet Take 1 tablet (10 mg total) by mouth daily. 01/03/22  Yes Paz, Alda Berthold, MD  fexofenadine (ALLEGRA) 180 MG tablet Take 180 mg by mouth daily.   Yes [provider]  Melatonin 10 MG TABS Take by mouth.   Yes [provider]  Omega-3 Fatty Acids (FISH OIL) 300 MG CAPS Take by mouth. (Mega Red)   Yes [provider]  Probiotic Product (PROBIOTIC ADVANCED PO) Take by mouth.   Yes [provider]  rosuvastatin (CRESTOR) 10 MG tablet Take 1  tablet (10 mg total) by mouth daily. 01/03/22  Yes Colon Branch, MD  TURMERIC PO Take by mouth.   Yes [provider]  Cholecalciferol (VITAMIN D3) 2000 UNITS TABS Take 1,000 Units by mouth daily. Patient not taking: Reported on 04/18/2022    [provider]  cyanocobalamin 500 MCG tablet Take 1,000 mcg by mouth daily. Patient not taking: Reported on 04/18/2022    [provider]  diclofenac Sodium (VOLTAREN) 1 % GEL Apply 2 g topically 4 (four) times daily.    [provider]  fluocinonide gel (LIDEX) 1.85 % Apply 1 application topically 3 (three) times daily as needed.  05/17/13   [provider]  naproxen (NAPROSYN) 500 MG tablet TAKE 1 TABLET(500 MG) BY MOUTH DAILY AS NEEDED FOR MODERATE PAIN 03/29/21   Colon Branch, MD    Current Outpatient Medications  Medication Sig Dispense Refill   acetaminophen (TYLENOL) 650 MG CR tablet Take 650 mg by mouth 2 (two) times daily as needed for pain.     ALPRAZolam (XANAX) 0.25 MG tablet TAKE 2 TABLETS(0.5 MG) BY MOUTH AT BEDTIME AS NEEDED FOR SLEEP 60 tablet 3   Ascorbic Acid (VITAMIN C) 1000 MG tablet Take 1,000 mg by mouth daily.  calcium carbonate (OS-CAL) 600 MG TABS Take 1,200 mg by mouth daily.     DILANTIN 100 MG ER capsule TAKE 5 CAPSULES DAILY AS   DIRECTED 450 capsule 1   ezetimibe (ZETIA) 10 MG tablet Take 1 tablet (10 mg total) by mouth daily. 90 tablet 1   fexofenadine (ALLEGRA) 180 MG tablet Take 180 mg by mouth daily.     Melatonin 10 MG TABS Take by mouth.     Omega-3 Fatty Acids (FISH OIL) 300 MG CAPS Take by mouth. (Mega Red)     Probiotic Product (PROBIOTIC ADVANCED PO) Take by mouth.     rosuvastatin (CRESTOR) 10 MG tablet Take 1 tablet (10 mg total) by mouth daily. 90 tablet 1   TURMERIC PO Take by mouth.     Cholecalciferol (VITAMIN D3) 2000 UNITS TABS Take 1,000 Units by mouth daily. (Patient not taking: Reported on 04/18/2022)     cyanocobalamin 500 MCG tablet Take 1,000 mcg by mouth  daily. (Patient not taking: Reported on 04/18/2022)     diclofenac Sodium (VOLTAREN) 1 % GEL Apply 2 g topically 4 (four) times daily.     fluocinonide gel (LIDEX) 8.93 % Apply 1 application topically 3 (three) times daily as needed.      naproxen (NAPROSYN) 500 MG tablet TAKE 1 TABLET(500 MG) BY MOUTH DAILY AS NEEDED FOR MODERATE PAIN 30 tablet 3   Current Facility-Administered Medications  Medication Dose Route Frequency Provider Last Rate Last Admin   0.9 %  sodium chloride infusion  500 mL Intravenous Once Gatha Mayer, MD        Allergies as of 04/18/2022   (No Known Allergies)    Family History  Problem Relation Age of Onset   Macular degeneration Father    Cancer Mother        throat; smoker   Macular degeneration Paternal Uncle    Rectal cancer Other        maternal cousin   Colon cancer Neg Hx    Esophageal cancer Neg Hx    Stomach cancer Neg Hx    Stroke Neg Hx    Heart disease Neg Hx    Diabetes Neg Hx    Prostate cancer Neg Hx     Social History   Socioeconomic History   Marital status: Married    Spouse name: Not on file   Number of children: 0   Years of education: Not on file   Highest education level: Not on file  Occupational History   Occupation: part Armed forces logistics/support/administrative officer, 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  Tobacco Use   Smoking status: Never   Smokeless tobacco: Never  Substance and Sexual Activity   Alcohol use: Yes    Alcohol/week: 6.0 - 8.0 standard drinks of alcohol    Types: 6 - 8 Cans of beer per week    Comment: 2-3  beers qd   Drug use: Not Currently    Types: PCP   Sexual activity: Not on file  Other Topics Concern   Not on file  Social History Narrative   Household pt and wife   Social Determinants of Health   Financial Resource Strain: Low Risk  (08/07/2021)   Overall Financial Resource Strain (CARDIA)    Difficulty of Paying Living Expenses: Not hard at all  Food Insecurity: No Food Insecurity (08/07/2021)    Hunger Vital Sign    Worried About Running Out of Food in the Last Year: Never true  Ran Out of Food in the Last Year: Never true  Transportation Needs: No Transportation Needs (08/07/2021)   PRAPARE - Hydrologist (Medical): No    Lack of Transportation (Non-Medical): No  Physical Activity: Inactive (08/07/2021)   Exercise Vital Sign    Days of Exercise per Week: 0 days    Minutes of Exercise per Session: 0 min  Stress: No Stress Concern Present (08/07/2021)   [place]    Feeling of Stress : Not at all  Social Connections: Moderately Isolated (08/07/2021)   Social Connection and Isolation Panel [NHANES]    Frequency of Communication with Friends and Family: More than three times a week    Frequency of Social Gatherings with Friends and Family: More than three times a week    Attends Religious Services: Never    Marine scientist or Organizations: No    Attends Archivist Meetings: Never    Marital Status: Married  Human resources officer Violence: Not At Risk (08/07/2021)   Humiliation, Afraid, Rape, and Kick questionnaire    Fear of Current or Ex-Partner: No    Emotionally Abused: No    Physically Abused: No    Sexually Abused: No    Review of Systems:  All other review of systems negative except as mentioned in the HPI.  Physical Exam: Vital signs BP (!) 139/59   Pulse (!) 47   Temp (!) 96.8 F (36 C)   Ht '5\' 10"'$  (1.778 m)   Wt 203 lb (92.1 kg)   SpO2 95%   BMI 29.13 kg/m   General:   Alert,  Well-developed, well-nourished, pleasant and cooperative in NAD Lungs:  Clear throughout to auscultation.   Heart:  Regular rate and rhythm; no murmurs, clicks, rubs,  or gallops. Abdomen:  Soft, nontender and nondistended. Normal bowel sounds.   Neuro/Psych:  Alert and cooperative. Normal mood and affect. A and O x 3   '@'$  Simonne Maffucci, MD, [facility]  Gastroenterology [phone] (pager) 04/18/2022 8:28 AM@

## 2022-04-18 NOTE — Progress Notes (Signed)
Called to room to assist during endoscopic procedure.  Patient ID and intended procedure confirmed with present staff. Received instructions for my participation in the procedure from the performing physician.  

## 2022-04-18 NOTE — Progress Notes (Signed)
Pt's states no medical or surgical changes since previsit or office visit. 

## 2022-04-18 NOTE — Patient Instructions (Addendum)
I removed one very tiny polyp. Also saw diverticulosis (see handout) and your hemorrhoids were swollen which often occurs with the preparation and diarrhea.  I will let you know pathology results and when/if to have another routine colonoscopy by mail and/or My Chart.  Please resume medications and typical diet.  I appreciate the opportunity to care for you. Gatha Mayer, MD, FA   Read all of the handouts given to you by your recovery room nurse.  Resume all of your medications as ordered.YOU HAD AN ENDOSCOPIC PROCEDURE TODAY AT [facility]:   Refer to the procedure report that was given to you for any specific questions about what was found during the examination.  If the procedure report does not answer your questions, please call your gastroenterologist to clarify.  If you requested that your care partner not be given the details of your procedure findings, then the procedure report has been included in a sealed envelope for you to review at your convenience later.  YOU SHOULD EXPECT: Some feelings of bloating in the abdomen. Passage of more gas than usual.  Walking can help get rid of the air that was put into your GI tract during the procedure and reduce the bloating. If you had a lower endoscopy (such as a colonoscopy or flexible sigmoidoscopy) you may notice spotting of blood in your stool or on the toilet paper. If you underwent a bowel prep for your procedure, you may not have a normal bowel movement for a few days.  Please Note:  You might notice some irritation and congestion in your nose or some drainage.  This is from the oxygen used during your procedure.  There is no need for concern and it should clear up in a day or so.  SYMPTOMS TO REPORT IMMEDIATELY:  Following lower endoscopy (colonoscopy or flexible sigmoidoscopy):  Excessive amounts of blood in the stool  Significant tenderness or worsening of abdominal pains  Swelling of the abdomen that is new,  acute  Fever of 100F or higher   For urgent or emergent issues, a gastroenterologist can be reached at any hour by calling [phone]. Do not use MyChart messaging for urgent concerns.    DIET:  We do recommend a small meal at first, but then you may proceed to your regular diet.  Drink plenty of fluids but you should avoid alcoholic beverages for 24 hours.  ACTIVITY:  You should plan to take it easy for the rest of today and you should NOT DRIVE or use heavy machinery until tomorrow (because of the sedation medicines used during the test).    FOLLOW UP: Our staff will call the number listed on your records the next business day following your procedure.  We will call around 7:15- 8:00 am to check on you and address any questions or concerns that you may have regarding the information given to you following your procedure. If we do not reach you, we will leave a message.     If any biopsies were taken you will be contacted by phone or by letter within the next 1-3 weeks.  Please call us at [phone] if you have not heard about the biopsies in 3 weeks.    SIGNATURES/CONFIDENTIALITY: You and/or your care partner have signed paperwork which will be entered into your electronic medical record.  These signatures attest to the fact that that the information above on your After Visit Summary has been reviewed and is understood.  Full responsibility  of the confidentiality of this discharge information lies with you and/or your care-partner.

## 2022-04-18 NOTE — Progress Notes (Signed)
Report to pacu rn. Vss. Care resumed by rn. 

## 2022-04-18 NOTE — Op Note (Signed)
[place] Patient Name: Kenneth [name] Procedure Date: 04/18/2022 8:34 AM MRN: [id number] Endoscopist: Gatha Mayer , MD, [id number] Age: 66 Referring MD:  Date of Birth: 12/31/1955 Gender: Male Account #: [id number] Procedure:                Colonoscopy Indications:              Screening for colorectal malignant neoplasm, Last                            colonoscopy: 2013 Medicines:                Monitored Anesthesia Care Procedure:                Pre-Anesthesia Assessment:                           - Prior to the procedure, a History and Physical                            was performed, and patient medications and                            allergies were reviewed. The patient's tolerance of                            previous anesthesia was also reviewed. The risks                            and benefits of the procedure and the sedation                            options and risks were discussed with the patient.                            All questions were answered, and informed consent                            was obtained. Prior Anticoagulants: The patient has                            taken no anticoagulant or antiplatelet agents. ASA                            Grade Assessment: II - A patient with mild systemic                            disease. After reviewing the risks and benefits,                            the patient was deemed in satisfactory condition to                            undergo the procedure.  After obtaining informed consent, the colonoscope                            was passed under direct vision. Throughout the                            procedure, the patient's blood pressure, pulse, and                            oxygen saturations were monitored continuously. The                            Olympus CF-HQ190L [id number]) Colonoscope was                            introduced through the anus and advanced to  the the                            cecum, identified by appendiceal orifice and                            ileocecal valve. The colonoscopy was performed                            without difficulty. The patient tolerated the                            procedure well. The quality of the bowel                            preparation was good. The bowel preparation used                            was Miralax via split dose instruction. Scope In: 8:37:17 AM Scope Out: 8:53:56 AM Scope Withdrawal Time: 0 hours 14 minutes 41 seconds  Total Procedure Duration: 0 hours 16 minutes 39 seconds  Findings:                 The perianal and digital rectal examinations were                            normal. Pertinent negatives include normal prostate                            (size, shape, and consistency).                           A 2 mm polyp was found in the cecum. The polyp was                            sessile. The polyp was removed with a cold snare.                            Resection and retrieval were complete. Verification  of patient identification for the specimen was                            done. Estimated blood loss was minimal.                           Multiple small-mouthed diverticula were found in                            the sigmoid colon.                           External and internal hemorrhoids were found.                           The exam was otherwise without abnormality on                            direct and retroflexion views. Complications:            No immediate complications. Estimated Blood Loss:     Estimated blood loss was minimal. Impression:               - One 2 mm polyp in the cecum, removed with a cold                            snare. Resected and retrieved.                           - Diverticulosis in the sigmoid colon.                           - External and internal hemorrhoids.                           - The  examination was otherwise normal on direct                            and retroflexion views. Recommendation:           - Patient has a contact number available for                            emergencies. The signs and symptoms of potential                            delayed complications were discussed with the                            patient. Return to normal activities tomorrow.                            Written discharge instructions were provided to the                            patient.                           -  Resume previous diet.                           - Continue present medications.                           - No recommendation at this time regarding repeat                            colonoscopy due to age.                           - Await pathology results. Gatha Mayer, MD 04/18/2022 9:00:03 AM This report has been signed electronically.

## 2022-04-19 ENCOUNTER — Telehealth: Payer: Self-pay

## 2022-04-19 NOTE — Telephone Encounter (Signed)
  Follow up Call-     04/18/2022    8:18 AM  Call back number  Post procedure Call Back phone  # [phone]  Permission to leave phone message Yes    Post op call attempted, no answer, left WM.

## 2022-05-01 ENCOUNTER — Encounter: Payer: Self-pay | Admitting: Internal Medicine

## 2022-05-02 NOTE — Progress Notes (Deleted)
Kenneth [name] D.[facility] Phone: [phone]   Assessment and Plan:     There are no diagnoses linked to this encounter.  ***   Pertinent previous records reviewed include ***   Follow Up: ***     Subjective:   I, Kenneth [name], am serving as a Education administrator for [name]   Chief Complaint: bilateral knee pain    HPI:   Pt is a 67 y/o male c/o bilat knee pain, L>R. Pt notes knees have bothered him for years intermittently. Pt locates pain to the anterior aspect of bilat knees. Pt reports the pain is to the point where it's waking him up at night when it is flared.  Currently says that pain is tolerable on a day-to-day basis and is not limiting him from any activities.  Denies falls or trauma.  Denies numbness/tingling/weakness in extremities   12/27/20 Patient states Last night at 3 am woke up with sharp/aching pain and knees were hot to the touch. Patient used ice packs to help with the pain to fall asleep. Patient states he wasn't any more active than usual, but states that he needs to do something different to help with the pain so he can get back to his regular quality of life.    01/10/21 Patient states that mostly feeling better, still has some popping and cracking in the morning. Patient states that he still is having a tender spot on the top of his knee cap that when it hits something it is very tender. Patient states he had doen a lot of steps recently and he is still doing well.    05/21/21 Patient states that the knee are aching, kneecaps aren't bothering like they were just wants bilateral injections   09/20/2021 Patient states ready for injections was doing really well up until a week ago   01/01/2022 Patient states that he is here for injections at the least     01/24/2022 Patient states are the same if not worst   02/05/2022 Patient states he is ready for some gel     02/13/2022 Patient states he is ready for second gel injection.   02/20/22 Patient presents for procedure only third HA injection.   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   Current Outpatient Medications:    acetaminophen (TYLENOL) 650 MG CR tablet, Take 650 mg by mouth 2 (two) times daily as needed for pain., Disp: , Rfl:    ALPRAZolam (XANAX) 0.25 MG tablet, TAKE 2 TABLETS(0.5 MG) BY MOUTH AT BEDTIME AS NEEDED FOR SLEEP, Disp: 60 tablet, Rfl: 3   Ascorbic Acid (VITAMIN C) 1000 MG tablet, Take 1,000 mg by mouth daily., Disp: , Rfl:    calcium carbonate (OS-CAL) 600 MG TABS, Take 1,200 mg by mouth daily., Disp: , Rfl:    Cholecalciferol (  VITAMIN D3) 2000 UNITS TABS, Take 1,000 Units by mouth daily. (Patient not taking: Reported on 04/18/2022), Disp: , Rfl:    cyanocobalamin 500 MCG tablet, Take 1,000 mcg by mouth daily. (Patient not taking: Reported on 04/18/2022), Disp: , Rfl:    diclofenac Sodium (VOLTAREN) 1 % GEL, Apply 2 g topically 4 (four) times daily., Disp: , Rfl:    DILANTIN 100 MG ER capsule, TAKE 5 CAPSULES DAILY AS   DIRECTED, Disp: 450 capsule, Rfl: 1   ezetimibe (ZETIA) 10 MG tablet, Take 1 tablet (10 mg total) by mouth daily., Disp: 90 tablet, Rfl: 1   fexofenadine (ALLEGRA) 180 MG tablet, Take 180 mg by mouth daily., Disp: , Rfl:    fluocinonide gel (LIDEX) 0.16 %, Apply 1 application  topically 3 (three) times daily as needed. , Disp: , Rfl:    Melatonin 10 MG TABS, Take by mouth., Disp: , Rfl:    naproxen (NAPROSYN) 500 MG tablet, TAKE 1 TABLET(500 MG) BY MOUTH DAILY AS NEEDED FOR MODERATE PAIN, Disp: 30 tablet, Rfl: 3   Omega-3 Fatty Acids (FISH OIL) 300 MG CAPS, Take by mouth. (Mega Red), Disp: , Rfl:    Probiotic Product (PROBIOTIC ADVANCED PO), Take by mouth., Disp: , Rfl:    rosuvastatin (CRESTOR) 10 MG tablet, Take 1 tablet (10 mg total) by mouth daily., Disp: 90 tablet, Rfl: 1   TURMERIC PO, Take by mouth., Disp: , Rfl:    Objective:     There were no vitals filed for this visit.    There is no height or weight on file to calculate BMI.    Physical Exam:    ***   Electronically signed by:  Kenneth [name] D.[facility] 1:52 PM 05/02/22

## 2022-05-03 ENCOUNTER — Ambulatory Visit (INDEPENDENT_AMBULATORY_CARE_PROVIDER_SITE_OTHER): Payer: Medicare Other | Admitting: Sports Medicine

## 2022-05-03 VITALS — HR 57 | Ht 70.0 in | Wt 203.0 lb

## 2022-05-03 DIAGNOSIS — M25562 Pain in left knee: Secondary | ICD-10-CM | POA: Diagnosis not present

## 2022-05-03 DIAGNOSIS — G8929 Other chronic pain: Secondary | ICD-10-CM

## 2022-05-03 DIAGNOSIS — M25561 Pain in right knee: Secondary | ICD-10-CM | POA: Diagnosis not present

## 2022-05-03 DIAGNOSIS — M170 Bilateral primary osteoarthritis of knee: Secondary | ICD-10-CM

## 2022-05-03 NOTE — Progress Notes (Signed)
Kenneth [name] D.[facility] [place] Phone: [phone]   Assessment and Plan:     1. Osteoarthritis of patellofemoral joints of both knees 2. Chronic pain of both knees  -Chronic with exacerbation, subsequent visit - Moderate relief in bilateral knee pain with bilateral Zilretta injections at previous office visit on 04/05/2022 with right knee overall improving more compared with left - Patient has a trip planned to [place] in February, and wants to follow-up in clinic to discuss possibility of intra-articular CSI at that time.  We discussed how this timeframe would put [place] at about 2 months from previous injection, so Zilretta would not be approved, and that we would ideally like to wait for 3 months to allow for proper healing of tissues.  Patient verbalized understanding and we will discuss further at follow-up visit.  Also discussed the possibility of PRP injections  Pertinent previous records reviewed include none   Follow Up: 3 to 4 weeks prior to patient's vacation.  Could consider repeat intra-articular CSI   Subjective:   I, Kenneth [name], am serving as a Education administrator for Doctor Kenneth [name]   Chief Complaint: bilateral knee pain    HPI:   Pt is a 67 y/o male c/o bilat knee pain, L>R. Pt notes knees have bothered him for years intermittently. Pt locates pain to the anterior aspect of bilat knees. Pt reports the pain is to the point where it's waking him up at night when it is flared.  Currently says that pain is tolerable on a day-to-day basis and is not limiting him from any activities.  Denies falls or trauma.  Denies numbness/tingling/weakness in extremities   12/27/20 Patient states Last night at 3 am woke up with sharp/aching pain and knees were hot to the touch. Patient used ice packs to help with the pain to fall asleep. Patient states he wasn't any more active than usual, but states that he needs to do  something different to help with the pain so he can get back to his regular quality of life.    01/10/21 Patient states that mostly feeling better, still has some popping and cracking in the morning. Patient states that he still is having a tender spot on the top of his knee cap that when it hits something it is very tender. Patient states he had doen a lot of steps recently and he is still doing well.    05/21/21 Patient states that the knee are aching, kneecaps aren't bothering like they were just wants bilateral injections   09/20/2021 Patient states ready for injections was doing really well up until a week ago   01/01/2022 Patient states that he is here for injections at the least     01/24/2022 Patient states are the same if not worst   02/05/2022 Patient states he is ready for some gel    02/13/2022 Patient states he is ready for second gel injection.   02/20/22 Patient presents for procedure only third HA injection.   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   Current Outpatient Medications:    acetaminophen (TYLENOL) 650 MG CR tablet, Take 650 mg by mouth 2 (two) times daily as needed for pain., Disp: , Rfl:    ALPRAZolam (XANAX) 0.25 MG tablet, TAKE 2 TABLETS(0.5 MG) BY MOUTH AT BEDTIME AS NEEDED FOR SLEEP, Disp: 60 tablet, Rfl: 3   Ascorbic Acid (VITAMIN C) 1000 MG tablet, Take 1,000 mg by mouth  daily., Disp: , Rfl:    calcium carbonate (OS-CAL) 600 MG TABS, Take 1,200 mg by mouth daily., Disp: , Rfl:    Cholecalciferol (VITAMIN D3) 2000 UNITS TABS, Take 1,000 Units by mouth daily., Disp: , Rfl:    cyanocobalamin 500 MCG tablet, Take 1,000 mcg by mouth daily., Disp: , Rfl:    diclofenac Sodium (VOLTAREN) 1 % GEL, Apply 2 g topically 4 (four) times daily., Disp: , Rfl:    DILANTIN 100 MG ER capsule, TAKE 5 CAPSULES DAILY AS   DIRECTED, Disp: 450 capsule, Rfl: 1   ezetimibe (ZETIA) 10 MG tablet, Take 1 tablet (10 mg total) by mouth daily., Disp: 90 tablet, Rfl: 1   fexofenadine (ALLEGRA) 180 MG tablet, Take 180 mg by mouth daily., Disp: , Rfl:    fluocinonide gel (LIDEX) 3.42 %, Apply 1 application topically 3 (three) times daily as needed. , Disp: , Rfl:    Melatonin 10 MG TABS, Take by mouth., Disp: , Rfl:    naproxen (NAPROSYN) 500 MG tablet, TAKE 1 TABLET(500 MG) BY MOUTH DAILY AS NEEDED FOR MODERATE PAIN, Disp: 30 tablet, Rfl: 3   Omega-3 Fatty Acids (FISH OIL) 300 MG CAPS, Take by mouth. (Mega Red), Disp: , Rfl:    Probiotic Product (PROBIOTIC ADVANCED PO), Take by mouth., Disp: , Rfl:    rosuvastatin (CRESTOR) 10 MG tablet, Take 1 tablet (10 mg total) by mouth daily., Disp: 90 tablet, Rfl: 1   TURMERIC PO, Take by mouth., Disp: , Rfl:    Objective:     Vitals:   05/03/22 1122  Pulse: (!) 57  SpO2: 97%  Weight: 203 lb (92.1 kg)  Height: '5\' 10"'$  (1.778 m)      Body mass index is 29.13 kg/m.    Physical Exam:    General:  awake, alert oriented, no acute distress nontoxic Skin: no suspicious lesions or rashes Neuro:sensation intact, no deficits, strength 5/5 with no deficits, no atrophy, normal muscle tone Psych: No signs of anxiety, depression or other mood disorder   Bilateral knee: No swelling No deformity Neg fluid wave, joint milking ROM Flex 100, Ext 5 TTP mildly medial and lateral joint line NTTP over the quad tendon, medial fem condyle, lat fem condyle,  patella, plica, patella tendon, tibial tuberostiy, fibular head, posterior fossa, pes anserine bursa, gerdy's tubercle,   Neg anterior and posterior drawer Neg lachman Neg sag sign Negative varus stress Negative valgus stress Negative McMurray   Electronically signed by:  Kenneth [name] D.[facility] 11:46 AM 05/03/22

## 2022-05-03 NOTE — Patient Instructions (Signed)
Good to see you   

## 2022-05-28 ENCOUNTER — Ambulatory Visit: Payer: Federal, State, Local not specified - PPO | Admitting: Sports Medicine

## 2022-05-31 NOTE — Progress Notes (Unsigned)
Kenneth [name] D.[place] Phone: [phone]   Assessment and Plan:     There are no diagnoses linked to this encounter.  ***   Pertinent previous records reviewed include ***   Follow Up: ***     Subjective:   I,  , am serving as a Education administrator for [name]   Chief Complaint: bilateral knee pain    HPI:   Pt is a 67 y/o male c/o bilat knee pain, L>R. Pt notes knees have bothered him for years intermittently. Pt locates pain to the anterior aspect of bilat knees. Pt reports the pain is to the point where it's waking him up at night when it is flared.  Currently says that pain is tolerable on a day-to-day basis and is not limiting him from any activities.  Denies falls or trauma.  Denies numbness/tingling/weakness in extremities   12/27/20 Patient states Last night at 3 am woke up with sharp/aching pain and knees were hot to the touch. Patient used ice packs to help with the pain to fall asleep. Patient states he wasn't any more active than usual, but states that he needs to do something different to help with the pain so he can get back to his regular quality of life.    01/10/21 Patient states that mostly feeling better, still has some popping and cracking in the morning. Patient states that he still is having a tender spot on the top of his knee cap that when it hits something it is very tender. Patient states he had doen a lot of steps recently and he is still doing well.    05/21/21 Patient states that the knee are aching, kneecaps aren't bothering like they were just wants bilateral injections   09/20/2021 Patient states ready for injections was doing really well up until a week ago   01/01/2022 Patient states that he is here for injections at the least     01/24/2022 Patient states are the same if not worst   02/05/2022 Patient states he is ready for some gel     02/13/2022 Patient states he is ready for second gel injection.   02/20/22 Patient presents for procedure only third HA injection.   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   Current Outpatient Medications:    acetaminophen (TYLENOL) 650 MG CR tablet, Take 650 mg by mouth 2 (two) times daily as needed for pain., Disp: , Rfl:    ALPRAZolam (XANAX) 0.25 MG tablet, TAKE 2 TABLETS(0.5 MG) BY MOUTH AT BEDTIME AS NEEDED FOR SLEEP, Disp: 60 tablet, Rfl: 3   Ascorbic Acid (VITAMIN C) 1000 MG tablet, Take 1,000 mg by mouth daily., Disp: , Rfl:    calcium carbonate (OS-CAL) 600 MG TABS, Take 1,200 mg by mouth daily.,  Disp: , Rfl:    Cholecalciferol (VITAMIN D3) 2000 UNITS TABS, Take 1,000 Units by mouth daily., Disp: , Rfl:    cyanocobalamin 500 MCG tablet, Take 1,000 mcg by mouth daily., Disp: , Rfl:    diclofenac Sodium (VOLTAREN) 1 % GEL, Apply 2 g topically 4 (four) times daily., Disp: , Rfl:    DILANTIN 100 MG ER capsule, TAKE 5 CAPSULES DAILY AS   DIRECTED, Disp: 450 capsule, Rfl: 1   ezetimibe (ZETIA) 10 MG tablet, Take 1 tablet (10 mg total) by mouth daily., Disp: 90 tablet, Rfl: 1   fexofenadine (ALLEGRA) 180 MG tablet, Take 180 mg by mouth daily., Disp: , Rfl:    fluocinonide gel (LIDEX) [id number] %, Apply 1 application topically 3 (three) times daily as needed. , Disp: , Rfl:     Melatonin 10 MG TABS, Take by mouth., Disp: , Rfl:    naproxen (NAPROSYN) 500 MG tablet, TAKE 1 TABLET(500 MG) BY MOUTH DAILY AS NEEDED FOR MODERATE PAIN, Disp: 30 tablet, Rfl: 3   Omega-3 Fatty Acids (FISH OIL) 300 MG CAPS, Take by mouth. (Mega Red), Disp: , Rfl:    Probiotic Product (PROBIOTIC ADVANCED PO), Take by mouth., Disp: , Rfl:    rosuvastatin (CRESTOR) 10 MG tablet, Take 1 tablet (10 mg total) by mouth daily., Disp: 90 tablet, Rfl: 1   TURMERIC PO, Take by mouth., Disp: , Rfl:    Objective:     There were no vitals filed for this visit.    There is no height or weight on file to calculate BMI.    Physical Exam:    ***   Electronically signed by:  Kenneth [name] D.[facility] 7:26 AM 05/31/22

## 2022-06-03 ENCOUNTER — Ambulatory Visit (INDEPENDENT_AMBULATORY_CARE_PROVIDER_SITE_OTHER): Payer: Medicare Other | Admitting: Sports Medicine

## 2022-06-03 VITALS — BP 120/84 | HR 62 | Ht 70.0 in | Wt 204.0 lb

## 2022-06-03 DIAGNOSIS — M25562 Pain in left knee: Secondary | ICD-10-CM | POA: Diagnosis not present

## 2022-06-03 DIAGNOSIS — M170 Bilateral primary osteoarthritis of knee: Secondary | ICD-10-CM | POA: Diagnosis not present

## 2022-06-03 DIAGNOSIS — M25561 Pain in right knee: Secondary | ICD-10-CM

## 2022-06-03 DIAGNOSIS — G8929 Other chronic pain: Secondary | ICD-10-CM

## 2022-06-03 NOTE — Patient Instructions (Addendum)
Good to see you Have a great time on vacation

## 2022-06-24 ENCOUNTER — Encounter: Payer: Self-pay | Admitting: Internal Medicine

## 2022-06-24 ENCOUNTER — Telehealth (INDEPENDENT_AMBULATORY_CARE_PROVIDER_SITE_OTHER): Payer: Medicare Other | Admitting: Internal Medicine

## 2022-06-24 VITALS — Ht 70.0 in | Wt 200.0 lb

## 2022-06-24 DIAGNOSIS — J40 Bronchitis, not specified as acute or chronic: Secondary | ICD-10-CM | POA: Diagnosis not present

## 2022-06-24 MED ORDER — AZITHROMYCIN 250 MG PO TABS: ORAL_TABLET | ORAL | 0 refills | Status: DC

## 2022-06-24 MED ORDER — HYDROCODONE BIT-HOMATROP MBR 5-1.5 MG/5ML PO SOLN: 5.0000 mL | Freq: Two times a day (BID) | ORAL | 0 refills | Status: DC | PRN

## 2022-06-24 NOTE — Progress Notes (Unsigned)
Subjective:    Patient ID: Kenneth [name]., male    DOB: June 20, 1955, 67 y.o.   MRN: [id number]  DOS:  06/24/2022 Type of visit - description: Virtual Visit via Video Note  I connected with the above patient  by a video enabled telemedicine application and verified that I am speaking with the correct person using two identifiers.   Location of patient: home  Location of provider: office  Persons participating in the virtual visit: patient, provider   I discussed the limitations of evaluation and management by telemedicine and the availability of in person appointments. The patient expressed understanding and agreed to proceed.  Acute  Symptoms started 4 days ago: Chest congestion, moderate cough, typically dry. Some wheezing.  Denies fever or chills No unusual aches or pains No nausea vomiting, appetite is okay. Wife has similar symptoms but recovered within 48 hours.    Review of Systems See above   Past Medical History:  Diagnosis Date   Allergy    Hx of colonic polyp 2007   Hyperlipidemia    Insomnia 06/02/2014   Syncope    in 20s; no etiology, no recurrence. Dilantin Rxed    Past Surgical History:  Procedure Laterality Date   colonoscopy with polypectomy  2007   Dr Carlean Purl, 4 mm rectal polyp   DENTAL SURGERY     bone grafts, to get implants   NASAL FRACTURE SURGERY     TONSILLECTOMY AND ADENOIDECTOMY      Current Outpatient Medications  Medication Instructions   acetaminophen (TYLENOL) 650 mg, Oral, 2 times daily PRN   ALPRAZolam (XANAX) 0.25 MG tablet TAKE 2 TABLETS(0.5 MG) BY MOUTH AT BEDTIME AS NEEDED FOR SLEEP   calcium carbonate (OS-CAL) 1,200 mg, Oral, Daily   cyanocobalamin (VITAMIN B12) 1,000 mcg, Oral, Daily   diclofenac Sodium (VOLTAREN) 2 g, Topical, 4 times daily   DILANTIN 100 MG ER capsule TAKE 5 CAPSULES DAILY AS   DIRECTED   ezetimibe (ZETIA) 10 mg, Oral, Daily   fexofenadine (ALLEGRA) 180 mg, Daily   fluocinonide gel (LIDEX) [id number] %  1 application , Topical, 3 times daily PRN   Melatonin 10 MG TABS Oral   naproxen (NAPROSYN) 500 MG tablet TAKE 1 TABLET(500 MG) BY MOUTH DAILY AS NEEDED FOR MODERATE PAIN   Omega-3 Fatty Acids (FISH OIL) 300 MG CAPS Oral, (Mega Red)    Probiotic Product (PROBIOTIC ADVANCED PO) Oral   rosuvastatin (CRESTOR) 10 mg, Oral, Daily   TURMERIC PO Oral   vitamin C 1,000 mg, Oral, Daily,     Vitamin D3 1,000 Units, Oral, Daily       Objective:   Physical Exam Ht '5\' 10"'$  (1.778 m)   Wt 200 lb (90.7 kg)   BMI 28.70 kg/m  No vital signs available, alert oriented x 3, in no distress, no cough noted, voice is nasal.     Assessment     Assessment Hyperlipidemia Insomnia-- xanax prn, rx per pcp Allergies Syncope, in the 1980s, on Dilantin since then, used to see Dr. Erling Cruz, reluctant to stop dilantin Elevated LFTs:  Chronic, on-off x years  --  hepatitis B C (-) 2008, iron, ferritin, alpha-1 antitrypsin normal 2016.  --CT 4-/2018: Hepatic steatosis.   --SPEP 2019 (-).   --GI visit 02-2022: Multiple labs negative, likely multifactorial: EtOH, fatty liver.  No further email at this point Incomplete RBBB-previously a stress test was negative Osteopenia?   DEXA 07/18/2016 normal Covid infection 05-2020  PLAN: URI, bronchitis: SX as  described above, symptoms started 4 days ago, tested negative for COVID yesterday. We agreed on the following: Recheck for COVID today or early tomorrow, window of opportunity for treatment ends tomorrow. Treat conservatively with fluids, Mucinex DM which he has, Flonase and cough control with hydrocodone. Z-Pak sent, only to be used if he is not better in the next 2 to 3 days Seek medical attention if not better, see message with detailed instructions.      I discussed the assessment and treatment plan with the patient. The patient was provided an opportunity to ask questions and all were answered. The patient agreed with the plan and demonstrated an understanding  of the instructions.   The patient was advised to call back or seek an in-person evaluation if the symptoms worsen or if the condition fails to improve as anticipated.

## 2022-06-25 NOTE — Assessment & Plan Note (Signed)
URI, bronchitis: SX as described above, symptoms started 4 days ago, tested negative for COVID yesterday. We agreed on the following: Recheck for COVID today or early tomorrow, window of opportunity for treatment ends tomorrow. Treat conservatively with fluids, Mucinex DM which he has, Flonase and cough control with hydrocodone. Z-Pak sent, only to be used if he is not better in the next 2 to 3 days Seek medical attention if not better, see message with detailed instructions.

## 2022-06-26 ENCOUNTER — Ambulatory Visit: Payer: Medicare Other | Admitting: Family Medicine

## 2022-06-26 NOTE — Telephone Encounter (Signed)
Pt likes to be seen in person, please arrange

## 2022-07-08 ENCOUNTER — Ambulatory Visit (HOSPITAL_BASED_OUTPATIENT_CLINIC_OR_DEPARTMENT_OTHER)
Admission: RE | Admit: 2022-07-08 | Discharge: 2022-07-08 | Disposition: A | Discharge status: Home / Self Care | Payer: Medicare Other | Source: Ambulatory Visit | Attending: Family Medicine | Admitting: Family Medicine

## 2022-07-08 ENCOUNTER — Encounter: Payer: Self-pay | Admitting: Family Medicine

## 2022-07-08 ENCOUNTER — Ambulatory Visit: Payer: Medicare Other | Admitting: Family Medicine

## 2022-07-08 VITALS — BP 144/64 | HR 52 | Resp 18 | Ht 70.0 in | Wt 200.0 lb

## 2022-07-08 DIAGNOSIS — M25551 Pain in right hip: Secondary | ICD-10-CM

## 2022-07-08 NOTE — Progress Notes (Signed)
   Acute Office Visit  Subjective:     Patient ID: Kenneth [name]., male    DOB: 07-29-1955, 67 y.o.   MRN: [id number]  Chief Complaint  Patient presents with   Hip Pain    Had a fall 07/06/2022 Pt wants a xray      Patient is in today for fall and hip pain.   Patient reports that Saturday night (2 days ago) he fell down the steps of deck and landed on his right hip. States it immediately started hurting, but he was able to walk. Pain has been lingering and is especially worse with walking, hip flexion (going up stairs, squatting, lifting leg to put socks on, etc). He has had some trouble sleeping due to the pain. He has gotten mild relief with tylenol. Pain is mostly lateral and deep. He denies any radiation of pain, numbness, tingling, incontinence, back pain.       ROS All review of systems negative except what is listed in the HPI      Objective:    BP (!) 144/64   Pulse (!) 52   Resp 18   Ht 5\' 10"  (1.778 m)   Wt 200 lb (90.7 kg)   SpO2 96%   BMI 28.70 kg/m    Physical Exam Vitals reviewed.  Constitutional:      Appearance: Normal appearance.  Musculoskeletal:        General: Tenderness present. No swelling. Normal range of motion.     Comments: Right hip laterally TTP  Skin:    General: Skin is warm and dry.  Neurological:     Mental Status: He is alert and oriented to person, place, and time.     Gait: Gait normal.  Psychiatric:        Mood and Affect: Mood normal.        Behavior: Behavior normal.        Thought Content: Thought content normal.        Judgment: Judgment normal.     No results found for any visits on 07/08/22.      Assessment & Plan:   Problem List Items Addressed This Visit   None Visit Diagnoses     Pain of right hip    -  Primary   Relevant Orders   DG HIP UNILAT W OR W/O PELVIS 2-3 VIEWS RIGHT     Continue with OTC analgesics, rest, ice Xray today given trauma Patient aware of signs/symptoms requiring  further/urgent evaluation.   No orders of the defined types were placed in this encounter.   Return if symptoms worsen or fail to improve.  Terrilyn Saver, NP

## 2022-07-09 ENCOUNTER — Encounter: Payer: Self-pay | Admitting: Family Medicine

## 2022-07-26 ENCOUNTER — Ambulatory Visit: Payer: Medicare Other | Admitting: Internal Medicine

## 2022-07-26 VITALS — BP 132/82 | HR 52 | Temp 97.5°F | Resp 16 | Ht 70.0 in | Wt 201.0 lb

## 2022-07-26 DIAGNOSIS — R7989 Other specified abnormal findings of blood chemistry: Secondary | ICD-10-CM | POA: Diagnosis not present

## 2022-07-26 DIAGNOSIS — E785 Hyperlipidemia, unspecified: Secondary | ICD-10-CM

## 2022-07-26 LAB — HEPATIC FUNCTION PANEL
ALT: 87 U/L — ABNORMAL HIGH (ref 0–53)
AST: 56 U/L — ABNORMAL HIGH (ref 0–37)
Albumin: 4.3 g/dL (ref 3.5–5.2)
Alkaline Phosphatase: 95 U/L (ref 39–117)
Bilirubin, Direct: 0.2 mg/dL (ref 0.0–0.3)
Total Bilirubin: 0.6 mg/dL (ref 0.2–1.2)
Total Protein: 6.3 g/dL (ref 6.0–8.3)

## 2022-07-26 NOTE — Patient Instructions (Addendum)
Vaccines I recommend:  Covid booster RSV vaccine  Please bring [place] a copy of your Healthcare Power of Attorney for your chart.     GO TO THE LAB : Get the blood work     GO TO THE FRONT DESK, PLEASE SCHEDULE YOUR APPOINTMENTS Come back for a physical exam by 01-2023

## 2022-07-26 NOTE — Progress Notes (Unsigned)
Subjective:    Patient ID: Kenneth ShapeEugene T Blum Jr., male    DOB: 1955/05/13, 67 y.o.   MRN: [id number]  DOS:  07/26/2022 Type of visit - description: f/u  Saw GI few months ago, note reviewed. Has no major concerns. Good med compliance. Takes Tylenol and naproxen as needed. Still drinks alcohol most nights.   Review of Systems See above   Past Medical History:  Diagnosis Date   Allergy    Hx of colonic polyp 2007   Hyperlipidemia    Insomnia 06/02/2014   Syncope    in 20s; no etiology, no recurrence. Dilantin Rxed    Past Surgical History:  Procedure Laterality Date   colonoscopy with polypectomy  2007   Dr Leone PayorGessner, 4 mm rectal polyp   DENTAL SURGERY     bone grafts, to get implants   NASAL FRACTURE SURGERY     TONSILLECTOMY AND ADENOIDECTOMY      Current Outpatient Medications  Medication Instructions   acetaminophen (TYLENOL) 650 mg, Oral, 2 times daily PRN   ALPRAZolam (XANAX) 0.25 MG tablet TAKE 2 TABLETS(0.5 MG) BY MOUTH AT BEDTIME AS NEEDED FOR SLEEP   calcium carbonate (OS-CAL) 1,200 mg, Oral, Daily   cyanocobalamin (VITAMIN B12) 1,000 mcg, Oral, Daily   diclofenac Sodium (VOLTAREN) 2 g, Topical, 4 times daily   DILANTIN 100 MG ER capsule TAKE 5 CAPSULES DAILY AS   DIRECTED   ezetimibe (ZETIA) 10 mg, Oral, Daily   fexofenadine (ALLEGRA) 180 mg, Daily   fluocinonide gel (LIDEX) 0.05 % 1 application , Topical, 3 times daily PRN   Melatonin 10 MG TABS Oral   naproxen (NAPROSYN) 500 MG tablet TAKE 1 TABLET(500 MG) BY MOUTH DAILY AS NEEDED FOR MODERATE PAIN   Omega-3 Fatty Acids (FISH OIL) 300 MG CAPS Oral, (Mega Red)    Probiotic Product (PROBIOTIC ADVANCED PO) Oral   rosuvastatin (CRESTOR) 10 mg, Oral, Daily   TURMERIC PO Oral   vitamin C 1,000 mg, Oral, Daily,     Vitamin D3 1,000 Units, Oral, Daily       Objective:   Physical Exam BP 132/82   Pulse (!) 52   Temp (!) 97.5 F (36.4 C) (Oral)   Resp 16   Ht 5\' 10"  (1.778 m)   Wt 201 lb (91.2 kg)    SpO2 97%   BMI 28.84 kg/m  General:   Well developed, NAD, BMI noted. HEENT:  Normocephalic . Face symmetric, atraumatic Lungs:  CTA B Normal respiratory effort, no intercostal retractions, no accessory muscle use. Heart: RRR,  no murmur.  Lower extremities: no pretibial edema bilaterally  Skin: Not pale. Not jaundice Neurologic:  alert & oriented X3.  Speech normal, gait appropriate for age and unassisted Psych--  Cognition and judgment appear intact.  Cooperative with normal attention span and concentration.  Behavior appropriate. No anxious or depressed appearing.      Assessment     Assessment Hyperlipidemia Insomnia-- xanax prn, rx per pcp Allergies Syncope, in the 1980s, on Dilantin since then, used to see Dr. Sandria ManlyLove, reluctant to stop dilantin Elevated LFTs:  Chronic, on-off x years  --  hepatitis B C (-) 2008, iron, ferritin, alpha-1 antitrypsin normal 2016.  --CT 4-/2018: Hepatic steatosis.   --SPEP 2019 (-).   --GI visit 02-2022: Multiple labs negative, likely multifactorial: EtOH, fatty liver.  Rec to f/u w/ PCP Incomplete RBBB-previously a stress test was negative Osteopenia?   DEXA 07/18/2016 normal Covid infection 05-2020  PLAN: Hyperlipidemia: Well-controlled, continue Zetia,  rosuvastatin. Increased LFTs, saw GI 03/11/2022.  Workup negative.  Possibly factors fatty liver, EtOH.  Recommend to PCP to follow the issue. Hardly ever takes naproxen Takes Tylenol every 2 to 3 days. States he drinks 2 beers at night, recommend to drink no more than 1. Recheck LFTs. Increase MCV: Per chart review, last B12 okay.  Recheck periodically. RTC CPX 01-2023

## 2022-07-27 NOTE — Assessment & Plan Note (Signed)
Hyperlipidemia: Well-controlled, continue Zetia, rosuvastatin. Increased LFTs, saw GI 03/11/2022.  Workup negative.  Possibly factors fatty liver, EtOH.  Recommend to PCP to follow the issue. Hardly ever takes naproxen Takes Tylenol every 2 to 3 days. States he drinks 2 beers at night, recommend to drink no more than 1. Recheck LFTs. Increase MCV: Per chart review, last B12 okay.  Recheck periodically. RTC CPX 01-2023

## 2022-07-29 ENCOUNTER — Telehealth: Payer: Self-pay | Admitting: Sports Medicine

## 2022-07-29 NOTE — Telephone Encounter (Signed)
Pt called, knee pain returning. He is able to sleep but as he is getting more active his pain is back.  Pt completed Gelsyn 02/20/2022, then did Zilretta 04/05/2022. He is unsure when/what he next plan would be and seeking our advice.

## 2022-07-31 ENCOUNTER — Telehealth: Payer: Self-pay | Admitting: Internal Medicine

## 2022-07-31 NOTE — Telephone Encounter (Signed)
Responded to pt via MyChart

## 2022-07-31 NOTE — Telephone Encounter (Signed)
Ran patient through zilretta portal waiting on insurance response

## 2022-07-31 NOTE — Telephone Encounter (Signed)
Contacted Kenneth [name]. to schedule their annual wellness visit. Appointment made for 08/09/2022.  Verlee Rossetti; Care Guide Ambulatory Clinical Support [place] [facility] Direct Dial: [phone]

## 2022-07-31 NOTE — Telephone Encounter (Signed)
Patient would like to proceed with Zilretta. Can we work on approval. Once done, I can call him to schedule and order.

## 2022-08-01 ENCOUNTER — Ambulatory Visit: Payer: Federal, State, Local not specified - PPO | Admitting: Internal Medicine

## 2022-08-06 ENCOUNTER — Telehealth: Payer: Self-pay | Admitting: Internal Medicine

## 2022-08-06 MED ORDER — EZETIMIBE 10 MG PO TABS: 10.0000 mg | ORAL_TABLET | Freq: Every day | ORAL | 1 refills | Status: DC

## 2022-08-06 MED ORDER — ROSUVASTATIN CALCIUM 10 MG PO TABS: 10.0000 mg | ORAL_TABLET | Freq: Every day | ORAL | 1 refills | Status: DC

## 2022-08-06 NOTE — Telephone Encounter (Signed)
Patient called back to follow up the status.  Any changes?

## 2022-08-06 NOTE — Telephone Encounter (Signed)
Kenneth [name] with [facility] calling in refill for the following:   Prescription Request  08/06/2022  Is this a "Controlled Substance" medicine? No  LOV: 07/26/2022  What is the name of the medication or equipment?  rosuvastatin (CRESTOR) 10 MG tablet   ezetimibe (ZETIA) 10 MG tablet  Have you contacted your pharmacy to request a refill? Yes   Which pharmacy would you like this sent to?   [facility] - [place], [place] - [address] [facility] AT Portal to Registered [facility] [address] [address] Phone: [phone] Fax: [phone]    Patient notified that their request is being sent to the clinical staff for review and that they should receive a response within 2 business days.   Please advise at Mobile [phone] (mobile)

## 2022-08-06 NOTE — Addendum Note (Signed)
Addended byConrad [place]ah[place] D on: 08/06/2022 10:09 AM   Modules accepted: Orders

## 2022-08-06 NOTE — Telephone Encounter (Signed)
Rxs sent

## 2022-08-09 ENCOUNTER — Telehealth: Payer: Self-pay | Admitting: Internal Medicine

## 2022-08-09 NOTE — Telephone Encounter (Signed)
Contacted Kenneth [name]. to schedule their annual wellness visit. Appointment made for 08/26/2022.  Verlee Rossetti; Care Guide Ambulatory Clinical Support [facility] Direct Dial: [phone]

## 2022-08-12 NOTE — Progress Notes (Unsigned)
Aleen Sells D.[name] [address] [place] [address] Phone: [phone]   Assessment and Plan:     There are no diagnoses linked to this encounter.  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 Pt notes knees have bothered him for years intermittently. Pt locates pain to the anterior aspect of bilat knees. Pt reports the pain is to the point where it's waking him up at night when it is flared.  Currently says that pain is tolerable on a day-to-day basis and is not limiting him from any activities.  Denies falls or trauma.  Denies numbness/tingling/weakness in extremities   12/27/20 Patient states Last night at 3 am woke up with sharp/aching pain and knees were hot to the touch. Patient used ice packs to help with the pain to fall asleep. Patient states he wasn't any more active than usual, but states that he needs to do something different to help with the pain so he can get back to his regular quality of life.    01/10/21 Patient states that mostly feeling better, still has some popping and cracking in the morning. Patient states that he still is having a tender spot on the top of his knee cap that when it hits something it is very tender. Patient states he had doen a lot of steps recently and he is still doing well.    05/21/21 Patient states that the knee are aching, kneecaps aren't bothering like they were just wants bilateral injections   09/20/2021 Patient states ready for injections was doing really well up until a week ago   01/01/2022 Patient states that he is here for injections at the least     01/24/2022 Patient states are the same if not worst   02/05/2022 Patient states he is ready for some gel     02/13/2022 Patient states he is ready for second gel injection.   02/20/22 Patient presents for procedure only third HA injection.   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  , and able to get through the night better , knees like the compression when wears them    04/05/2022 Patient states   05/03/2022 Patient states he is moving in the right direction has some discomfort in the left knee more than right , still has some pain but not waking him up in the night , the left is still grabbing more , needs a little something , last injection were def improvement , wants something before vacation in february    06/03/2022 Patient states that he's not there yet , still tight and isnt able to sleep through the night again   08/13/2022 Patient states      Relevant Historical Information: None pertinent  Additional pertinent review of systems negative.   Current Outpatient Medications:    acetaminophen (TYLENOL) 650 MG CR tablet, Take 650 mg by mouth 2 (two) times daily as needed for pain., Disp: , Rfl:    ALPRAZolam (XANAX) 0.25 MG tablet, TAKE 2 TABLETS(0.5 MG) BY MOUTH AT BEDTIME AS NEEDED FOR SLEEP, Disp: 60 tablet, Rfl: 3   Ascorbic Acid (VITAMIN C) 1000 MG tablet,  Take 1,000 mg by mouth daily., Disp: , Rfl:    calcium carbonate (OS-CAL) 600 MG TABS, Take 1,200 mg by mouth daily., Disp: , Rfl:    Cholecalciferol (VITAMIN D3) 2000 UNITS TABS, Take 1,000 Units by mouth daily., Disp: , Rfl:    cyanocobalamin 500 MCG tablet, Take 1,000 mcg by mouth daily., Disp: , Rfl:    diclofenac Sodium (VOLTAREN) 1 % GEL, Apply 2 g topically 4 (four) times daily., Disp: , Rfl:    DILANTIN 100 MG ER capsule, TAKE 5 CAPSULES DAILY AS   DIRECTED, Disp: 450 capsule, Rfl: 1   ezetimibe (ZETIA) 10 MG tablet, Take 1 tablet (10 mg total) by mouth daily., Disp: 90 tablet, Rfl: 1   fexofenadine (ALLEGRA) 180 MG tablet, Take 180 mg by mouth daily., Disp: , Rfl:     fluocinonide gel (LIDEX) 0.05 %, Apply 1 application topically 3 (three) times daily as needed. , Disp: , Rfl:    Melatonin 10 MG TABS, Take by mouth., Disp: , Rfl:    naproxen (NAPROSYN) 500 MG tablet, TAKE 1 TABLET(500 MG) BY MOUTH DAILY AS NEEDED FOR MODERATE PAIN, Disp: 30 tablet, Rfl: 3   Omega-3 Fatty Acids (FISH OIL) 300 MG CAPS, Take by mouth. (Mega Red), Disp: , Rfl:    Probiotic Product (PROBIOTIC ADVANCED PO), Take by mouth., Disp: , Rfl:    rosuvastatin (CRESTOR) 10 MG tablet, Take 1 tablet (10 mg total) by mouth daily., Disp: 90 tablet, Rfl: 1   TURMERIC PO, Take by mouth., Disp: , Rfl:    Objective:     There were no vitals filed for this visit.    There is no height or weight on file to calculate BMI.    Physical Exam:    ***   Electronically signed by:  Aleen Sells D.[name] 11:53 AM 08/12/22

## 2022-08-13 ENCOUNTER — Ambulatory Visit (INDEPENDENT_AMBULATORY_CARE_PROVIDER_SITE_OTHER): Payer: Medicare Other | Admitting: Sports Medicine

## 2022-08-13 VITALS — Ht 70.0 in

## 2022-08-13 DIAGNOSIS — M170 Bilateral primary osteoarthritis of knee: Secondary | ICD-10-CM | POA: Diagnosis not present

## 2022-08-13 DIAGNOSIS — M25562 Pain in left knee: Secondary | ICD-10-CM

## 2022-08-13 DIAGNOSIS — M25561 Pain in right knee: Secondary | ICD-10-CM | POA: Diagnosis not present

## 2022-08-13 DIAGNOSIS — G8929 Other chronic pain: Secondary | ICD-10-CM

## 2022-08-13 MED ORDER — TRIAMCINOLONE ACETONIDE 32 MG IX SRER
32.0000 mg | Freq: Once | INTRA_ARTICULAR | Status: AC
  Administered 2022-08-13: 32 mg via INTRA_ARTICULAR

## 2022-08-26 ENCOUNTER — Ambulatory Visit (INDEPENDENT_AMBULATORY_CARE_PROVIDER_SITE_OTHER): Payer: Medicare Other | Admitting: *Deleted

## 2022-08-26 DIAGNOSIS — Z0000 Encounter for general adult medical examination without abnormal findings: Secondary | ICD-10-CM | POA: Diagnosis not present

## 2022-08-26 NOTE — Progress Notes (Signed)
Subjective:  Pt completed ADLs, Fall risk, and SDOH during e-check in on 08/25/22.  Answers verified with pt.    Kenneth [name]. is a 67 y.o. male who presents for Medicare Annual/Subsequent preventive examination.  I connected with  Kenneth [name]. on 08/26/22 by a audio enabled telemedicine application and verified that I am speaking with the correct person using two identifiers.  Patient Location: Home  Provider Location: Office/Clinic  I discussed the limitations of evaluation and management by telemedicine. The patient expressed understanding and agreed to proceed.   Review of Systems     Cardiac Risk Factors include: advanced age (>29men, >110 women);male gender;dyslipidemia     Objective:    There were no vitals filed for this visit. There is no height or weight on file to calculate BMI.     08/26/2022   11:04 AM 08/07/2021   10:27 AM  Advanced Directives  Does Patient Have a Medical Advance Directive? Yes Yes  Type of Estate agent of [place];Living will Healthcare Power of [place];Living will  Does patient want to make changes to medical advance directive? No - Patient declined No - Patient declined  Copy of Healthcare Power of Attorney in Chart? No - copy requested No - copy requested    Current Medications (verified) Outpatient Encounter Medications as of 08/26/2022  Medication Sig   acetaminophen (TYLENOL) 650 MG CR tablet Take 650 mg by mouth 2 (two) times daily as needed for pain.   ALPRAZolam (XANAX) 0.25 MG tablet TAKE 2 TABLETS(0.5 MG) BY MOUTH AT BEDTIME AS NEEDED FOR SLEEP   calcium carbonate (OS-CAL) 600 MG TABS Take 1,200 mg by mouth daily.   diclofenac Sodium (VOLTAREN) 1 % GEL Apply 2 g topically 4 (four) times daily.   DILANTIN 100 MG ER capsule TAKE 5 CAPSULES DAILY AS   DIRECTED   ezetimibe (ZETIA) 10 MG tablet Take 1 tablet (10 mg total) by mouth daily.   fexofenadine (ALLEGRA) 180 MG tablet Take 180 mg by mouth  daily.   fluocinonide gel (LIDEX) 0.05 % Apply 1 application topically 3 (three) times daily as needed.    Melatonin 10 MG TABS Take by mouth.   naproxen (NAPROSYN) 500 MG tablet TAKE 1 TABLET(500 MG) BY MOUTH DAILY AS NEEDED FOR MODERATE PAIN   Omega-3 Fatty Acids (FISH OIL) 300 MG CAPS Take by mouth. (Mega Red)   Probiotic Product (PROBIOTIC ADVANCED PO) Take by mouth.   rosuvastatin (CRESTOR) 10 MG tablet Take 1 tablet (10 mg total) by mouth daily.   TURMERIC PO Take by mouth.   [DISCONTINUED] Ascorbic Acid (VITAMIN C) 1000 MG tablet Take 1,000 mg by mouth daily.   [DISCONTINUED] Cholecalciferol (VITAMIN D3) 2000 UNITS TABS Take 1,000 Units by mouth daily.   [DISCONTINUED] cyanocobalamin 500 MCG tablet Take 1,000 mcg by mouth daily.   No facility-administered encounter medications on file as of 08/26/2022.    Allergies (verified) Patient has no known allergies.   History: Past Medical History:  Diagnosis Date   Allergy    Arthritis 2022   knees   Hx of colonic polyp 2007   Hyperlipidemia    Insomnia 06/02/2014   Seizures (HCC) 1983   Syncope    in 20s; no etiology, no recurrence. Dilantin Rxed   Past Surgical History:  Procedure Laterality Date   colonoscopy with polypectomy  2007   Dr Leone Payor, 4 mm rectal polyp   DENTAL SURGERY     bone grafts, to get implants  NASAL FRACTURE SURGERY     TONSILLECTOMY AND ADENOIDECTOMY     Family History  Problem Relation Age of Onset   Macular degeneration Father    Cancer Mother        throat; smoker   Macular degeneration Paternal Uncle    Rectal cancer Other        maternal cousin   Colon cancer Neg Hx    Esophageal cancer Neg Hx    Stomach cancer Neg Hx    Stroke Neg Hx    Heart disease Neg Hx    Diabetes Neg Hx    Prostate cancer Neg Hx    Social History   Socioeconomic History   Marital status: Married    Spouse name: Not on file   Number of children: 0   Years of education: Not on file   Highest education  level: Some college, no degree  Occupational History   Occupation: part Scientist, physiological, 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, sports  Tobacco Use   Smoking status: Never   Smokeless tobacco: Never  Substance and Sexual Activity   Alcohol use: Yes    Alcohol/week: 6.0 - 8.0 standard drinks of alcohol    Types: 6 - 8 Cans of beer per week    Comment: 2-3  beers qd   Drug use: Not Currently    Types: PCP   Sexual activity: Not Currently  Other Topics Concern   Not on file  Social History Narrative   Household pt and wife   Social Determinants of Health   Financial Resource Strain: Low Risk  (08/25/2022)   Overall Financial Resource Strain (CARDIA)    Difficulty of Paying Living Expenses: Not hard at all  Food Insecurity: No Food Insecurity (08/25/2022)   Hunger Vital Sign    Worried About Running Out of Food in the Last Year: Never true    Ran Out of Food in the Last Year: Never true  Transportation Needs: No Transportation Needs (08/25/2022)   PRAPARE - Administrator, Civil Service (Medical): No    Lack of Transportation (Non-Medical): No  Physical Activity: Insufficiently Active (08/25/2022)   Exercise Vital Sign    Days of Exercise per Week: 2 days    Minutes of Exercise per Session: 20 min  Stress: No Stress Concern Present (08/25/2022)   [employer] of Occupational Health - Occupational Stress Questionnaire    Feeling of Stress : Not at all  Social Connections: Unknown (08/25/2022)   Social Connection and Isolation Panel [NHANES]    Frequency of Communication with Friends and Family: Three times a week    Frequency of Social Gatherings with Friends and Family: More than three times a week    Attends Religious Services: Patient declined    Database administrator or Organizations: Yes    Attends Engineer, structural: More than 4 times per year    Marital Status: Married    Tobacco Counseling Counseling given: Not Answered   Clinical  Intake:  Pre-visit preparation completed: Yes  Pain : No/denies pain  Nutritional Risks: None Diabetes: No  How often do you need to have someone help you when you read instructions, pamphlets, or other written materials from your doctor or pharmacy?: 1 - Never   Activities of Daily Living    08/25/2022   10:07 PM  In your present state of health, do you have any difficulty performing the following activities:  Hearing? 0  Vision? 0  Difficulty concentrating or making decisions? 0  Walking or climbing stairs? 0  Dressing or bathing? 0  Doing errands, shopping? 0  Preparing Food and eating ? N  Using the Toilet? N  In the past six months, have you accidently leaked urine? N  Do you have problems with loss of bowel control? N  Managing your Medications? N  Managing your Finances? N  Housekeeping or managing your Housekeeping? N    Patient Care Team: Wanda Plump, MD as PCP - General (Internal Medicine) Annice Pih., MD as Consulting Physician (Urology)  Indicate any recent Medical Services you may have received from other than [facility] providers in the past year (date may be approximate).     Assessment:   This is a routine wellness examination for Kenneth [name].  Hearing/Vision screen No results found.  Dietary issues and exercise activities discussed: Current Exercise Habits: The patient does not participate in regular exercise at present, Exercise limited by: None identified   Goals Addressed   None    Depression Screen    08/26/2022   11:06 AM 07/26/2022   11:25 AM 06/24/2022    3:17 PM 01/30/2022    9:00 AM 08/07/2021   10:20 AM 01/24/2021   10:59 AM 07/12/2020   10:18 AM  PHQ 2/9 Scores  PHQ - 2 Score 0 0 0 0 0 0 0    Fall Risk    08/25/2022   10:07 PM 07/26/2022   11:25 AM 07/08/2022    4:25 PM 06/24/2022    3:17 PM 01/30/2022    9:00 AM  Fall Risk   Falls in the past year? 1 1 1  0 0  Number falls in past yr: 0 0 0 0 0  Injury with Fall? 0 0 1 0 0  Risk for  fall due to : No Fall Risks  History of fall(s)    Follow up Falls evaluation completed Falls evaluation completed Falls evaluation completed Falls evaluation completed Falls evaluation completed    FALL RISK PREVENTION PERTAINING TO THE HOME:  Any stairs in or around the home? Yes  If so, are there any without handrails? No  Home free of loose throw rugs in walkways, pet beds, electrical cords, etc? Yes  Adequate lighting in your home to reduce risk of falls? Yes   ASSISTIVE DEVICES UTILIZED TO PREVENT FALLS:  Life alert? No  Use of a cane, walker or w/c? No  Grab bars in the bathroom? No  Shower chair or bench in shower? Yes  Elevated toilet seat or a handicapped toilet?  Comfort height  TIMED UP AND GO:  Was the test performed?  No, audio visit .    Cognitive Function:        08/26/2022   11:09 AM 08/07/2021   10:27 AM  6CIT Screen  What Year? 0 points 0 points  What month? 0 points 0 points  What time? 0 points 0 points  Count back from 20 0 points 0 points  Months in reverse 0 points 0 points  Repeat phrase 0 points 0 points  Total Score 0 points 0 points    Immunizations Immunization History  Administered Date(s) Administered   Fluad Quad(high Dose 65+) 01/30/2019, 01/12/2020, 01/24/2021, 01/30/2022   Influenza Split 01/01/2012, 02/22/2014, 02/05/2017   Influenza Whole 02/23/2007   Influenza,inj,Quad PF,6+ Mos 01/21/2018   Influenza-Unspecified 02/18/2013, 01/20/2015, 01/19/2016   PFIZER(Purple Top)SARS-COV-2 Vaccination 06/05/2019, 06/29/2019, 02/10/2020   Pfizer Covid-19 Vaccine Bivalent Booster 81yrs & up 03/06/2021  Pneumococcal Conjugate-13 11/19/2018   Pneumococcal Polysaccharide-23 01/12/2020   Td 12/22/2006   Tdap 07/18/2016   Zoster Recombinat (Shingrix) 03/11/2018, 01/30/2019    TDAP status: Up to date  Flu Vaccine status: Up to date  Pneumococcal vaccine status: Up to date  Covid-19 vaccine status: Information provided on how to obtain  vaccines.   Qualifies for Shingles Vaccine? Yes   Zostavax completed No   Shingrix Completed?: Yes  Screening Tests Health Maintenance  Topic Date Due   COVID-19 Vaccine (5 - 2023-24 season) 12/21/2021   Medicare Annual Wellness (AWV)  08/08/2022   INFLUENZA VACCINE  11/21/2022   DTaP/Tdap/Td (3 - Td or Tdap) 07/19/2026   COLONOSCOPY (Pts 45-53yrs Insurance coverage will need to be confirmed)  04/18/2029   Pneumonia Vaccine 68+ Years old  Completed   Hepatitis C Screening  Completed   Zoster Vaccines- Shingrix  Completed   HPV VACCINES  Aged Out    Health Maintenance  Health Maintenance Due  Topic Date Due   COVID-19 Vaccine (5 - 2023-24 season) 12/21/2021   Medicare Annual Wellness (AWV)  08/08/2022    Colorectal cancer screening: Type of screening: Colonoscopy. Completed 04/18/22. Repeat every 7 years  Lung Cancer Screening: (Low Dose CT Chest recommended if Age 59-80 years, 30 pack-year currently smoking OR have quit w/in 15years.) does not qualify.   Additional Screening:  Hepatitis C Screening: does qualify; Completed 03/07/22  Vision Screening: Recommended annual ophthalmology exams for early detection of glaucoma and other disorders of the eye. Is the patient up to date with their annual eye exam?  Yes  Who is the provider or what is the name of the office in which the patient attends annual eye exams? Dr. London Sheer If pt is not established with a provider, would they like to be referred to a provider to establish care? No .   Dental Screening: Recommended annual dental exams for proper oral hygiene  Community Resource Referral / Chronic Care Management: CRR required this visit?  No   CCM required this visit?  No      Plan:     I have personally reviewed and noted the following in the patient's chart:   Medical and social history Use of alcohol, tobacco or illicit drugs  Current medications and supplements including opioid prescriptions. Patient is not  currently taking opioid prescriptions. Functional ability and status Nutritional status Physical activity Advanced directives List of other physicians Hospitalizations, surgeries, and ER visits in previous 12 months Vitals Screenings to include cognitive, depression, and falls Referrals and appointments  In addition, I have reviewed and discussed with patient certain preventive protocols, quality metrics, and best practice recommendations. A written personalized care plan for preventive services as well as general preventive health recommendations were provided to patient.   Due to this being a telephonic visit, the after visit summary with patients personalized plan was offered to patient via mail or my-chart. Patient would like to access on my-chart.  Kenneth [name], [place]   08/26/2022   Nurse Notes: None

## 2022-08-26 NOTE — Patient Instructions (Signed)
Kenneth [name] , Thank you for taking time to come for your Medicare Wellness Visit. I appreciate your ongoing commitment to your health goals. Please review the following plan we discussed and let me know if I can assist you in the future.     This is a list of the screening recommended for you and due dates:  Health Maintenance  Topic Date Due   COVID-19 Vaccine (5 - 2023-24 season) 12/21/2021   Flu Shot  11/21/2022   Medicare Annual Wellness Visit  08/26/2023   DTaP/Tdap/Td vaccine (3 - Td or Tdap) 07/19/2026   Colon Cancer Screening  04/18/2029   Pneumonia Vaccine  Completed   Hepatitis C Screening: USPSTF Recommendation to screen - Ages 43-79 yo.  Completed   Zoster (Shingles) Vaccine  Completed   HPV Vaccine  Aged Out    Next appointment: Follow up in one year for your annual wellness visit.   Preventive Care 57 Years and Older, Male Preventive care refers to lifestyle choices and visits with your health care provider that can promote health and wellness. What does preventive care include? A yearly physical exam. This is also called an annual well check. Dental exams once or twice a year. Routine eye exams. Ask your health care provider how often you should have your eyes checked. Personal lifestyle choices, including: Daily care of your teeth and gums. Regular physical activity. Eating a healthy diet. Avoiding tobacco and drug use. Limiting alcohol use. Practicing safe sex. Taking low doses of aspirin every day. Taking vitamin and mineral supplements as recommended by your health care provider. What happens during an annual well check? The services and screenings done by your health care provider during your annual well check will depend on your age, overall health, lifestyle risk factors, and family history of disease. Counseling  Your health care provider may ask you questions about your: Alcohol use. Tobacco use. Drug use. Emotional well-being. Home and  relationship well-being. Sexual activity. Eating habits. History of falls. Memory and ability to understand (cognition). Work and work Astronomer. Screening  You may have the following tests or measurements: Height, weight, and BMI. Blood pressure. Lipid and cholesterol levels. These may be checked every 5 years, or more frequently if you are over 46 years old. Skin check. Lung cancer screening. You may have this screening every year starting at age 73 if you have a 30-pack-year history of smoking and currently smoke or have quit within the past 15 years. Fecal occult blood test (FOBT) of the stool. You may have this test every year starting at age 33. Flexible sigmoidoscopy or colonoscopy. You may have a sigmoidoscopy every 5 years or a colonoscopy every 10 years starting at age 68. Prostate cancer screening. Recommendations will vary depending on your family history and other risks. Hepatitis C blood test. Hepatitis B blood test. Sexually transmitted disease (STD) testing. Diabetes screening. This is done by checking your blood sugar (glucose) after you have not eaten for a while (fasting). You may have this done every 1-3 years. Abdominal aortic aneurysm (AAA) screening. You may need this if you are a current or former smoker. Osteoporosis. You may be screened starting at age 51 if you are at high risk. Talk with your health care provider about your test results, treatment options, and if necessary, the need for more tests. Vaccines  Your health care provider may recommend certain vaccines, such as: Influenza vaccine. This is recommended every year. Tetanus, diphtheria, and acellular pertussis (Tdap, Td) vaccine. You  may need a Td booster every 10 years. Zoster vaccine. You may need this after age 45. Pneumococcal 13-valent conjugate (PCV13) vaccine. One dose is recommended after age 84. Pneumococcal polysaccharide (PPSV23) vaccine. One dose is recommended after age 59. Talk to your  health care provider about which screenings and vaccines you need and how often you need them. This information is not intended to replace advice given to you by your health care provider. Make sure you discuss any questions you have with your health care provider. Document Released: 05/05/2015 Document Revised: 12/27/2015 Document Reviewed: 02/07/2015 Elsevier Interactive Patient Education  2017 [employer] Prevention in the Home Falls can cause injuries. They can happen to people of all ages. There are many things you can do to make your home safe and to help prevent falls. What can I do on the outside of my home? Regularly fix the edges of walkways and driveways and fix any cracks. Remove anything that might make you trip as you walk through a door, such as a raised step or threshold. Trim any bushes or trees on the path to your home. Use bright outdoor lighting. Clear any walking paths of anything that might make someone trip, such as rocks or tools. Regularly check to see if handrails are loose or broken. Make sure that both sides of any steps have handrails. Any raised decks and porches should have guardrails on the edges. Have any leaves, snow, or ice cleared regularly. Use sand or salt on walking paths during winter. Clean up any spills in your garage right away. This includes oil or grease spills. What can I do in the bathroom? Use night lights. Install grab bars by the toilet and in the tub and shower. Do not use towel bars as grab bars. Use non-skid mats or decals in the tub or shower. If you need to sit down in the shower, use a plastic, non-slip stool. Keep the floor dry. Clean up any water that spills on the floor as soon as it happens. Remove soap buildup in the tub or shower regularly. Attach bath mats securely with double-sided non-slip rug tape. Do not have throw rugs and other things on the floor that can make you trip. What can I do in the bedroom? Use night  lights. Make sure that you have a light by your bed that is easy to reach. Do not use any sheets or blankets that are too big for your bed. They should not hang down onto the floor. Have a firm chair that has side arms. You can use this for support while you get dressed. Do not have throw rugs and other things on the floor that can make you trip. What can I do in the kitchen? Clean up any spills right away. Avoid walking on wet floors. Keep items that you use a lot in easy-to-reach places. If you need to reach something above you, use a strong step stool that has a grab bar. Keep electrical cords out of the way. Do not use floor polish or wax that makes floors slippery. If you must use wax, use non-skid floor wax. Do not have throw rugs and other things on the floor that can make you trip. What can I do with my stairs? Do not leave any items on the stairs. Make sure that there are handrails on both sides of the stairs and use them. Fix handrails that are broken or loose. Make sure that handrails are as long as the  stairways. Check any carpeting to make sure that it is firmly attached to the stairs. Fix any carpet that is loose or worn. Avoid having throw rugs at the top or bottom of the stairs. If you do have throw rugs, attach them to the floor with carpet tape. Make sure that you have a light switch at the top of the stairs and the bottom of the stairs. If you do not have them, ask someone to add them for you. What else can I do to help prevent falls? Wear shoes that: Do not have high heels. Have rubber bottoms. Are comfortable and fit you well. Are closed at the toe. Do not wear sandals. If you use a stepladder: Make sure that it is fully opened. Do not climb a closed stepladder. Make sure that both sides of the stepladder are locked into place. Ask someone to hold it for you, if possible. Clearly mark and make sure that you can see: Any grab bars or handrails. First and last  steps. Where the edge of each step is. Use tools that help you move around (mobility aids) if they are needed. These include: Canes. Walkers. Scooters. Crutches. Turn on the lights when you go into a dark area. Replace any light bulbs as soon as they burn out. Set up your furniture so you have a clear path. Avoid moving your furniture around. If any of your floors are uneven, fix them. If there are any pets around you, be aware of where they are. Review your medicines with your doctor. Some medicines can make you feel dizzy. This can increase your chance of falling. Ask your doctor what other things that you can do to help prevent falls. This information is not intended to replace advice given to you by your health care provider. Make sure you discuss any questions you have with your health care provider. Document Released: 02/02/2009 Document Revised: 09/14/2015 Document Reviewed: 05/13/2014 Elsevier Interactive Patient Education  2017 [employer].

## 2022-08-27 ENCOUNTER — Telehealth: Payer: Self-pay | Admitting: Sports Medicine

## 2022-08-27 NOTE — Telephone Encounter (Signed)
Pt does not feel much improvement after the last round of bil knee injections, wonders if Dr. Jean Rosenthal thinks it is time to order MRI's.

## 2022-08-28 ENCOUNTER — Other Ambulatory Visit: Payer: Self-pay | Admitting: Sports Medicine

## 2022-08-28 DIAGNOSIS — G8929 Other chronic pain: Secondary | ICD-10-CM

## 2022-08-28 NOTE — Progress Notes (Unsigned)
MRI referral placed

## 2022-08-28 NOTE — Telephone Encounter (Signed)
Called and spoke with patient and MRI referral has been placed

## 2022-08-30 ENCOUNTER — Other Ambulatory Visit: Payer: Self-pay | Admitting: Internal Medicine

## 2022-08-31 ENCOUNTER — Ambulatory Visit (INDEPENDENT_AMBULATORY_CARE_PROVIDER_SITE_OTHER): Payer: Medicare Other

## 2022-08-31 DIAGNOSIS — G8929 Other chronic pain: Secondary | ICD-10-CM

## 2022-08-31 DIAGNOSIS — M25562 Pain in left knee: Secondary | ICD-10-CM

## 2022-08-31 DIAGNOSIS — M25561 Pain in right knee: Secondary | ICD-10-CM | POA: Diagnosis not present

## 2022-09-02 ENCOUNTER — Telehealth: Payer: Self-pay | Admitting: Internal Medicine

## 2022-09-02 NOTE — Telephone Encounter (Signed)
PDMP okay, Rx sent 

## 2022-09-02 NOTE — Telephone Encounter (Signed)
Requesting: alprazolam 0.25mg   Contract: 07/26/22 UDS: Refuses  Last Visit: 07/26/22 Next Visit: 02/05/23 Last Refill: 01/17/22 #60 and 3RF   Please Advise

## 2022-09-09 NOTE — Progress Notes (Unsigned)
Aleen Sells [name] [address] [place] [address] Phone: [phone]   Assessment and Plan:     There are no diagnoses linked to this encounter.  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 Pt notes knees have bothered him for years intermittently. Pt locates pain to the anterior aspect of bilat knees. Pt reports the pain is to the point where it's waking him up at night when it is flared.  Currently says that pain is tolerable on a day-to-day basis and is not limiting him from any activities.  Denies falls or trauma.  Denies numbness/tingling/weakness in extremities   12/27/20 Patient states Last night at 3 am woke up with sharp/aching pain and knees were hot to the touch. Patient used ice packs to help with the pain to fall asleep. Patient states he wasn't any more active than usual, but states that he needs to do something different to help with the pain so he can get back to his regular quality of life.    01/10/21 Patient states that mostly feeling better, still has some popping and cracking in the morning. Patient states that he still is having a tender spot on the top of his knee cap that when it hits something it is very tender. Patient states he had doen a lot of steps recently and he is still doing well.    05/21/21 Patient states that the knee are aching, kneecaps aren't bothering like they were just wants bilateral injections   09/20/2021 Patient states ready for injections was doing really well up until a week ago   01/01/2022 Patient states that he is here for injections at the least     01/24/2022 Patient states are the same if not worst   02/05/2022 Patient states he is ready for some gel     02/13/2022 Patient states he is ready for second gel injection.   02/20/22 Patient presents for procedure only third HA injection.   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  , and able to get through the night better , knees like the compression when wears them    04/05/2022 Patient states   05/03/2022 Patient states he is moving in the right direction has some discomfort in the left knee more than right , still has some pain but not waking him up in the night , the left is still grabbing more , needs a little something , last injection were def improvement , wants something before vacation in february    06/03/2022 Patient states that he's not there yet , still tight and isnt able to sleep through the night again    08/13/2022 Patient states   09/10/2022 Patient states   Relevant Historical Information: None pertinent  Additional pertinent review of systems negative.   Current Outpatient Medications:    DILANTIN 100 MG ER capsule, TAKE 5 CAPSULES DAILY AS   DIRECTED, Disp: 450 capsule, Rfl: 1   acetaminophen (TYLENOL) 650 MG CR tablet, Take 650 mg by mouth 2 (two) times daily as needed for pain., Disp: , Rfl:    ALPRAZolam (XANAX) 0.25 MG tablet, TAKE 2 TABLETS(0.5  MG) BY MOUTH AT BEDTIME AS NEEDED FOR SLEEP, Disp: 60 tablet, Rfl: 2   calcium carbonate (OS-CAL) 600 MG TABS, Take 1,200 mg by mouth daily., Disp: , Rfl:    diclofenac Sodium (VOLTAREN) 1 % GEL, Apply 2 g topically 4 (four) times daily., Disp: , Rfl:    ezetimibe (ZETIA) 10 MG tablet, Take 1 tablet (10 mg total) by mouth daily., Disp: 90 tablet, Rfl: 1   fexofenadine (ALLEGRA) 180 MG tablet, Take 180 mg by mouth daily., Disp: , Rfl:    fluocinonide gel (LIDEX) 0.05 %, Apply 1 application topically 3 (three) times daily as needed. , Disp: , Rfl:    Melatonin 10 MG TABS, Take by mouth., Disp: , Rfl:    naproxen (NAPROSYN) 500 MG tablet, TAKE 1 TABLET(500 MG) BY MOUTH  DAILY AS NEEDED FOR MODERATE PAIN, Disp: 30 tablet, Rfl: 3   Omega-3 Fatty Acids (FISH OIL) 300 MG CAPS, Take by mouth. (Mega Red), Disp: , Rfl:    Probiotic Product (PROBIOTIC ADVANCED PO), Take by mouth., Disp: , Rfl:    rosuvastatin (CRESTOR) 10 MG tablet, Take 1 tablet (10 mg total) by mouth daily., Disp: 90 tablet, Rfl: 1   TURMERIC PO, Take by mouth., Disp: , Rfl:    Objective:     There were no vitals filed for this visit.    There is no height or weight on file to calculate BMI.    Physical Exam:    ***   Electronically signed by:  Aleen Sells [name] 11:47 AM 09/09/22

## 2022-09-10 ENCOUNTER — Ambulatory Visit (INDEPENDENT_AMBULATORY_CARE_PROVIDER_SITE_OTHER): Payer: Medicare Other | Admitting: Sports Medicine

## 2022-09-10 VITALS — BP 130/80 | HR 78 | Ht 70.0 in | Wt 198.0 lb

## 2022-09-10 DIAGNOSIS — S83511S Sprain of anterior cruciate ligament of right knee, sequela: Secondary | ICD-10-CM | POA: Diagnosis not present

## 2022-09-10 DIAGNOSIS — G8929 Other chronic pain: Secondary | ICD-10-CM

## 2022-09-10 DIAGNOSIS — M170 Bilateral primary osteoarthritis of knee: Secondary | ICD-10-CM

## 2022-09-10 DIAGNOSIS — M25562 Pain in left knee: Secondary | ICD-10-CM

## 2022-09-10 DIAGNOSIS — M25561 Pain in right knee: Secondary | ICD-10-CM | POA: Diagnosis not present

## 2022-09-10 NOTE — Patient Instructions (Addendum)
HA injections ordered 1000 mg  tylenol before bed   2 week follow up

## 2022-09-17 NOTE — Progress Notes (Deleted)
Kenneth [name] [name] [address] [place] [address] Phone: [phone]   Assessment and Plan:     There are no diagnoses linked to this encounter.  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 Pt notes knees have bothered him for years intermittently. Pt locates pain to the anterior aspect of bilat knees. Pt reports the pain is to the point where it's waking him up at night when it is flared.  Currently says that pain is tolerable on a day-to-day basis and is not limiting him from any activities.  Denies falls or trauma.  Denies numbness/tingling/weakness in extremities   12/27/20 Patient states Last night at 3 am woke up with sharp/aching pain and knees were hot to the touch. Patient used ice packs to help with the pain to fall asleep. Patient states he wasn't any more active than usual, but states that he needs to do something different to help with the pain so he can get back to his regular quality of life.    01/10/21 Patient states that mostly feeling better, still has some popping and cracking in the morning. Patient states that he still is having a tender spot on the top of his knee cap that when it hits something it is very tender. Patient states he had doen a lot of steps recently and he is still doing well.    05/21/21 Patient states that the knee are aching, kneecaps aren't bothering like they were just wants bilateral injections   09/20/2021 Patient states ready for injections was doing really well up until a week ago   01/01/2022 Patient states that he is here for injections at the least     01/24/2022 Patient states are the same if not worst   02/05/2022 Patient states he is ready for some gel     02/13/2022 Patient states he is ready for second gel injection.   02/20/22 Patient presents for procedure only third HA injection.   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  , and able to get through the night better , knees like the compression when wears them    04/05/2022 Patient states   05/03/2022 Patient states he is moving in the right direction has some discomfort in the left knee more than right , still has some pain but not waking him up in the night , the left is still grabbing more , needs a little something , last injection were def improvement , wants something before vacation in february    06/03/2022 Patient states that he's not there yet , still tight and isnt able to sleep through the night again    08/13/2022 Patient states   09/10/2022 Patient states here for MRI results, okay during the day has been having to do more around the house and been okay , but at night is when he has the most pain    09/24/2022 Patient states   Relevant Historical Information: None pertinent  Additional pertinent review of systems negative.   Current Outpatient Medications:    acetaminophen (TYLENOL) 650 MG CR tablet, Take 650 mg by mouth 2 (two) times daily as  needed for pain., Disp: , Rfl:    ALPRAZolam (XANAX) 0.25 MG tablet, TAKE 2 TABLETS(0.5 MG) BY MOUTH AT BEDTIME AS NEEDED FOR SLEEP, Disp: 60 tablet, Rfl: 2   calcium carbonate (OS-CAL) 600 MG TABS, Take 1,200 mg by mouth daily., Disp: , Rfl:    diclofenac Sodium (VOLTAREN) 1 % GEL, Apply 2 g topically 4 (four) times daily., Disp: , Rfl:    DILANTIN 100 MG ER capsule, TAKE 5 CAPSULES DAILY AS   DIRECTED, Disp: 450 capsule, Rfl: 1   ezetimibe (ZETIA) 10 MG tablet, Take 1 tablet (10 mg total) by mouth daily., Disp: 90 tablet, Rfl: 1   fexofenadine (ALLEGRA) 180 MG tablet, Take 180 mg by mouth daily., Disp: , Rfl:    fluocinonide gel (LIDEX) 0.05 %, Apply 1 application topically 3  (three) times daily as needed. , Disp: , Rfl:    Melatonin 10 MG TABS, Take by mouth., Disp: , Rfl:    naproxen (NAPROSYN) 500 MG tablet, TAKE 1 TABLET(500 MG) BY MOUTH DAILY AS NEEDED FOR MODERATE PAIN, Disp: 30 tablet, Rfl: 3   Omega-3 Fatty Acids (FISH OIL) 300 MG CAPS, Take by mouth. (Mega Red), Disp: , Rfl:    Probiotic Product (PROBIOTIC ADVANCED PO), Take by mouth., Disp: , Rfl:    rosuvastatin (CRESTOR) 10 MG tablet, Take 1 tablet (10 mg total) by mouth daily., Disp: 90 tablet, Rfl: 1   TURMERIC PO, Take by mouth., Disp: , Rfl:    Objective:     There were no vitals filed for this visit.    There is no height or weight on file to calculate BMI.    Physical Exam:    ***   Electronically signed by:  Kenneth [name] [name] 11:23 AM 09/17/22

## 2022-09-24 ENCOUNTER — Ambulatory Visit: Payer: Medicare Other | Admitting: Sports Medicine

## 2022-09-25 ENCOUNTER — Ambulatory Visit (INDEPENDENT_AMBULATORY_CARE_PROVIDER_SITE_OTHER): Payer: Medicare Other | Admitting: Sports Medicine

## 2022-09-25 DIAGNOSIS — G8929 Other chronic pain: Secondary | ICD-10-CM | POA: Diagnosis not present

## 2022-09-25 DIAGNOSIS — M25562 Pain in left knee: Secondary | ICD-10-CM | POA: Diagnosis not present

## 2022-09-25 DIAGNOSIS — S83511S Sprain of anterior cruciate ligament of right knee, sequela: Secondary | ICD-10-CM

## 2022-09-25 DIAGNOSIS — M25561 Pain in right knee: Secondary | ICD-10-CM

## 2022-09-25 DIAGNOSIS — M170 Bilateral primary osteoarthritis of knee: Secondary | ICD-10-CM

## 2022-09-25 MED ORDER — HYALURONAN 88 MG/4ML IX SOSY
88.0000 mg | PREFILLED_SYRINGE | Freq: Once | INTRA_ARTICULAR | Status: AC
  Administered 2022-09-25: 88 mg via INTRA_ARTICULAR

## 2022-09-25 NOTE — Progress Notes (Signed)
    Kenneth [name] [name] [address] [place] [address] Phone: [phone]   Assessment and Plan:     1. Chronic pain of both knees 2. Osteoarthritis of patellofemoral joints of both knees 3. 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  Tolerated well per note below. - Patient's recent knee MRIs showed degenerative changes in right knee with chronic ACL tear and mild to moderate degenerative changes in left knee - Patient has received mild to moderate temporary relief with alternating between CSI, Zilretta, HA injections - May continue Tylenol as needed for day-to-day pain relief - Continue physical activity as tolerated  Procedure: Knee Joint Injection Side: Bilateral Indication: Flare of osteoarthritis  Risks explained and consent was given verbally. The site was cleaned with alcohol prep. A needle was introduced with an anterio-lateral approach. Injection given using 88 mg Monovisc. This was well tolerated and resulted in symptomatic relief.  Needle was removed, hemostasis achieved, and post injection instructions were explained.  Procedure was repeated on contralateral side.  Pt was advised to call or return to clinic if these symptoms worsen or fail to improve as anticipated.   Other orders - Hyaluronan (MONOVISC) intra-articular injection 88 mg - Hyaluronan (MONOVISC) intra-articular injection 88 mg        Follow Up: As needed for repeat injections   Subjective:    Chief Complaint: Recurrent bilateral knee pain  HPI:   09/25/22 Patient presents for procedure only HA injections for bilateral knees  Relevant Historical Information: None pertinent  Additional pertinent review of systems negative.   Current Outpatient Medications:    acetaminophen (TYLENOL) 650 MG CR tablet, Take 650 mg  by mouth 2 (two) times daily as needed for pain., Disp: , Rfl:    ALPRAZolam (XANAX) 0.25 MG tablet, TAKE 2 TABLETS(0.5 MG) BY MOUTH AT BEDTIME AS NEEDED FOR SLEEP, Disp: 60 tablet, Rfl: 2   calcium carbonate (OS-CAL) 600 MG TABS, Take 1,200 mg by mouth daily., Disp: , Rfl:    diclofenac Sodium (VOLTAREN) 1 % GEL, Apply 2 g topically 4 (four) times daily., Disp: , Rfl:    DILANTIN 100 MG ER capsule, TAKE 5 CAPSULES DAILY AS   DIRECTED, Disp: 450 capsule, Rfl: 1   ezetimibe (ZETIA) 10 MG tablet, Take 1 tablet (10 mg total) by mouth daily., Disp: 90 tablet, Rfl: 1   fexofenadine (ALLEGRA) 180 MG tablet, Take 180 mg by mouth daily., Disp: , Rfl:    fluocinonide gel (LIDEX) 0.05 %, Apply 1 application topically 3 (three) times daily as needed. , Disp: , Rfl:    Melatonin 10 MG TABS, Take by mouth., Disp: , Rfl:    naproxen (NAPROSYN) 500 MG tablet, TAKE 1 TABLET(500 MG) BY MOUTH DAILY AS NEEDED FOR MODERATE PAIN, Disp: 30 tablet, Rfl: 3   Omega-3 Fatty Acids (FISH OIL) 300 MG CAPS, Take by mouth. (Mega Red), Disp: , Rfl:    Probiotic Product (PROBIOTIC ADVANCED PO), Take by mouth., Disp: , Rfl:    rosuvastatin (CRESTOR) 10 MG tablet, Take 1 tablet (10 mg total) by mouth daily., Disp: 90 tablet, Rfl: 1   TURMERIC PO, Take by mouth., Disp: , Rfl:        Electronically signed by:  Kenneth [name] [name] 2:10 PM 09/25/22

## 2022-10-28 NOTE — Progress Notes (Signed)
Kenneth [name] D.[name] [address] [place] [address] Phone: [phone]   Assessment and Plan:     1. Chronic pain of both knees 2. Osteoarthritis of patellofemoral joints of both knees 3. Rupture of anterior cruciate ligament of right knee, sequela  -Chronic with exacerbation, subsequent visit - Patient received minimal relief but only lasted 2 to 3 weeks after bilateral HA injections performed at previous office visit on 09/10/2022 - Patient elected for repeat intra-articular knee CSI.  Tolerated well per note below - Patient's recent knee MRIs showed degenerative changes bilaterally with chronic right ACL tear - Continue Tylenol as needed for day-to-day pain relief - Continue HEP - We can continue to alternate between CSI, Zilretta, HA injections as long as patient would like.  Further discussed PRP injections today which I do think could be of benefit for patient.  He will consider them and call us back if he would like PRP injections - Offered to refer to orthopedic surgery to discuss surgical options at any time.  Patient is not interested in referral at this time, but may call back if he would like a referral  Procedure: Knee Joint Injection Side: Bilateral Indication: Flare of osteoarthritis  Risks explained and consent was given verbally. The site was cleaned with alcohol prep. A needle was introduced with an anterio-lateral approach. Injection given using 2mL of 1% lidocaine without epinephrine and 1mL of kenalog 40mg /ml. This was well tolerated and resulted in symptomatic relief.  Needle was removed, hemostasis achieved, and post injection instructions were explained.  Procedure was repeated on contralateral side.  Pt was advised to call or return to clinic if these symptoms worsen or fail to improve as anticipated.   Pertinent previous records reviewed include none   Follow Up: As needed for repeat injection versus PRP  injection   Subjective:   I, Kenneth [name], am serving as a Neurosurgeon for [facility] [employer]   Chief Complaint: bilateral knee pain    HPI:   Pt is a 67 y/o male c/o bilat knee pain, L>R. Pt notes knees have bothered him for years intermittently. Pt locates pain to the anterior aspect of bilat knees. Pt reports the pain is to the point where it's waking him up at night when it is flared.  Currently says that pain is tolerable on a day-to-day basis and is not limiting him from any activities.  Denies falls or trauma.  Denies numbness/tingling/weakness in extremities   12/27/20 Patient states Last night at 3 am woke up with sharp/aching pain and knees were hot to the touch. Patient used ice packs to help with the pain to fall asleep. Patient states he wasn't any more active than usual, but states that he needs to do something different to help with the pain so he can get back to his regular quality of life.    01/10/21 Patient states that mostly feeling better, still has some popping and cracking in the morning. Patient states that he still is having a tender spot on the top of his knee cap that when it hits something it is very tender. Patient states he had doen a lot of steps recently and he is still doing well.    05/21/21 Patient states that the knee are aching, kneecaps aren't bothering like they were just wants bilateral injections   09/20/2021 Patient states ready for injections was doing really well up until a week ago   01/01/2022 Patient states that  he is here for injections at the least     01/24/2022 Patient states are the same if not worst   02/05/2022 Patient states he is ready for some gel    02/13/2022 Patient states he is ready for second gel injection.   02/20/22 Patient presents for procedure only third HA injection.   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  , and able to get through the night better , knees like  the compression when wears them    04/05/2022 Patient states   05/03/2022 Patient states he is moving in the right direction has some discomfort in the left knee more than right , still has some pain but not waking him up in the night , the left is still grabbing more , needs a little something , last injection were def improvement , wants something before vacation in february    06/03/2022 Patient states that he's not there yet , still tight and isnt able to sleep through the night again    08/13/2022 Patient states   09/10/2022 Patient states here for MRI results, okay during the day has been having to do more around the house and been okay , but at night is when he has the most pain    10/29/2022 Patient states here for CSI x2    Relevant Historical Information: None pertinent  Additional pertinent review of systems negative.   Current Outpatient Medications:    acetaminophen (TYLENOL) 650 MG CR tablet, Take 650 mg by mouth 2 (two) times daily as needed for pain., Disp: , Rfl:    ALPRAZolam (XANAX) 0.25 MG tablet, TAKE 2 TABLETS(0.5 MG) BY MOUTH AT BEDTIME AS NEEDED FOR SLEEP, Disp: 60 tablet, Rfl: 2   calcium carbonate (OS-CAL) 600 MG TABS, Take 1,200 mg by mouth daily., Disp: , Rfl:    diclofenac Sodium (VOLTAREN) 1 % GEL, Apply 2 g topically 4 (four) times daily., Disp: , Rfl:    DILANTIN 100 MG ER capsule, TAKE 5 CAPSULES DAILY AS   DIRECTED, Disp: 450 capsule, Rfl: 1   ezetimibe (ZETIA) 10 MG tablet, Take 1 tablet (10 mg total) by mouth daily., Disp: 90 tablet, Rfl: 1   fexofenadine (ALLEGRA) 180 MG tablet, Take 180 mg by mouth daily., Disp: , Rfl:    fluocinonide gel (LIDEX) 0.05 %, Apply 1 application topically 3 (three) times daily as needed. , Disp: , Rfl:    Melatonin 10 MG TABS, Take by mouth., Disp: , Rfl:    naproxen (NAPROSYN) 500 MG tablet, TAKE 1 TABLET(500 MG) BY MOUTH DAILY AS NEEDED FOR MODERATE PAIN, Disp: 30 tablet, Rfl: 3   Omega-3 Fatty Acids (FISH OIL) 300 MG  CAPS, Take by mouth. (Mega Red), Disp: , Rfl:    Probiotic Product (PROBIOTIC ADVANCED PO), Take by mouth., Disp: , Rfl:    rosuvastatin (CRESTOR) 10 MG tablet, Take 1 tablet (10 mg total) by mouth daily., Disp: 90 tablet, Rfl: 1   TURMERIC PO, Take by mouth., Disp: , Rfl:    Objective:     Vitals:   10/29/22 1116  Pulse: 68  SpO2: 98%  Weight: 202 lb (91.6 kg)  Height: 5\' 10"  (1.778 m)      Body mass index is 28.98 kg/m.    Physical Exam:    General:  awake, alert oriented, no acute distress nontoxic Skin: no suspicious lesions or rashes Neuro:sensation intact, no deficits, strength 5/5 with no deficits, no atrophy, normal  muscle tone Psych: No signs of anxiety, depression or other mood disorder   Bilateral knee: No swelling No deformity Neg fluid wave, joint milking ROM Flex 100, Ext 5 TTP mildly medial and lateral joint line NTTP over the quad tendon, medial fem condyle, lat fem condyle, patella, plica, patella tendon, tibial tuberostiy, fibular head, posterior fossa, pes anserine bursa, gerdy's tubercle,   Neg anterior and posterior drawer Neg lachman on left, equivocal on right Neg sag sign Negative varus stress Negative valgus stress Negative McMurray      Electronically signed by:  Kenneth [name] D.[name] 12:01 PM 10/29/22

## 2022-10-29 ENCOUNTER — Ambulatory Visit (INDEPENDENT_AMBULATORY_CARE_PROVIDER_SITE_OTHER): Payer: Medicare Other | Admitting: Sports Medicine

## 2022-10-29 VITALS — HR 68 | Ht 70.0 in | Wt 202.0 lb

## 2022-10-29 DIAGNOSIS — M25562 Pain in left knee: Secondary | ICD-10-CM | POA: Diagnosis not present

## 2022-10-29 DIAGNOSIS — M25561 Pain in right knee: Secondary | ICD-10-CM | POA: Diagnosis not present

## 2022-10-29 DIAGNOSIS — S83511S Sprain of anterior cruciate ligament of right knee, sequela: Secondary | ICD-10-CM

## 2022-10-29 DIAGNOSIS — G8929 Other chronic pain: Secondary | ICD-10-CM

## 2022-10-29 DIAGNOSIS — M170 Bilateral primary osteoarthritis of knee: Secondary | ICD-10-CM

## 2022-11-14 ENCOUNTER — Telehealth: Payer: Self-pay | Admitting: Sports Medicine

## 2022-11-14 ENCOUNTER — Other Ambulatory Visit: Payer: Self-pay | Admitting: Sports Medicine

## 2022-11-14 DIAGNOSIS — G8929 Other chronic pain: Secondary | ICD-10-CM

## 2022-11-14 DIAGNOSIS — M170 Bilateral primary osteoarthritis of knee: Secondary | ICD-10-CM

## 2022-11-14 DIAGNOSIS — S83511S Sprain of anterior cruciate ligament of right knee, sequela: Secondary | ICD-10-CM

## 2022-11-14 NOTE — Telephone Encounter (Signed)
Patient would like to go forward with the orthopedic referral for his knees. Can this be sent please?

## 2022-11-14 NOTE — Progress Notes (Unsigned)
Referral placed.

## 2022-11-18 ENCOUNTER — Encounter: Payer: Self-pay | Admitting: Orthopaedic Surgery

## 2022-11-18 ENCOUNTER — Ambulatory Visit (INDEPENDENT_AMBULATORY_CARE_PROVIDER_SITE_OTHER): Payer: Medicare Other | Admitting: Orthopaedic Surgery

## 2022-11-18 VITALS — Ht 70.0 in | Wt 202.0 lb

## 2022-11-18 DIAGNOSIS — M25561 Pain in right knee: Secondary | ICD-10-CM

## 2022-11-18 DIAGNOSIS — M25562 Pain in left knee: Secondary | ICD-10-CM | POA: Diagnosis not present

## 2022-11-18 DIAGNOSIS — G8929 Other chronic pain: Secondary | ICD-10-CM

## 2022-11-18 NOTE — Progress Notes (Signed)
The patient is a very pleasant 67 year old gentleman sent from Dr. Richardean Sale to evaluate and treat chronic bilateral knee pain.  He actually has plain films from 2022 on the canopy system of both knees and MRIs of both knees from May of this year.  He says most of his pain is at night and does wake him up every night.  He tries to avoid certain activities to protect his knees.  He wears knee sleeves on occasion.  He denies any injuries or any symptoms of instability.  He is never had surgery on his knees.  He has had multiple attempts at conservative treatment including steroid injections and hyaluronic acid injections and lives used to help significantly but he does not get as much help out of these types of injections at this point.  He is not a diabetic.  He is thin and active.  He just takes anti-inflammatories at night to see if this will help and he does ice his knees at night.  Examination of both knees shows no obvious scar.  Both knees have no malalignment.  Both knees have excellent range of motion and no effusion.  I did not feel any instability on exam of either knee.  His McMurray's were negative both knees.  Plain films of both knees from 2022 show normal alignment and no effusion.  The joint space are well-maintained in all 3 compartments.  The MRI is reviewed of both knees.  The right knee points to a chronic ACL tear but the medial and lateral meniscus of both knees are intact.  There is only mild cartilage thinning of both knees with no evidence of full-thickness cartilage loss of either knee.  Certainly with the failure conservative treatment a consideration will be knee replacement surgery.  I would like to send him to my partner Dr. Steward Drone for his critical assessment of the patient's knees to really determine whether or not the patient would potentially benefit from an arthroscopic intervention of his knees as opposed to knee replacement surgery to help with the chronic pain at  night.  The patient does agree with this referral because he really wants to get to where he is close to pain-free and can stay as active as possible in the long run.  Certainly the recommendation may end up being knee replacement surgery but I would really like another opinion as it relates to the patient's knees from someone who performs arthroscopic interventions to see if there is any benefit for that type of procedure as opposed to knee replacement surgery.

## 2022-11-25 ENCOUNTER — Encounter (HOSPITAL_BASED_OUTPATIENT_CLINIC_OR_DEPARTMENT_OTHER): Payer: Self-pay | Admitting: Orthopaedic Surgery

## 2022-11-25 ENCOUNTER — Ambulatory Visit (INDEPENDENT_AMBULATORY_CARE_PROVIDER_SITE_OTHER): Payer: Medicare Other | Admitting: Orthopaedic Surgery

## 2022-11-25 DIAGNOSIS — M25562 Pain in left knee: Secondary | ICD-10-CM | POA: Diagnosis not present

## 2022-11-25 DIAGNOSIS — M25561 Pain in right knee: Secondary | ICD-10-CM

## 2022-11-25 DIAGNOSIS — E8889 Other specified metabolic disorders: Secondary | ICD-10-CM | POA: Diagnosis not present

## 2022-11-25 MED ORDER — LIDOCAINE HCL 1 % IJ SOLN
4.0000 mL | INTRAMUSCULAR | Status: AC | PRN
Start: 2022-11-25 — End: 2022-11-25
  Administered 2022-11-25: 4 mL

## 2022-11-25 MED ORDER — TRIAMCINOLONE ACETONIDE 40 MG/ML IJ SUSP
80.0000 mg | INTRAMUSCULAR | Status: AC | PRN
Start: 2022-11-25 — End: 2022-11-25
  Administered 2022-11-25: 80 mg via INTRA_ARTICULAR

## 2022-11-25 NOTE — Progress Notes (Signed)
Chief Complaint: Right knee pain     History of Present Illness:    Kenneth [name]. is a 67 y.o. male presents today with bilateral knee pain that has been ongoing now for many years.  He has been seeing Dr. Jean Rosenthal as well as Dr. Magnus Ivan.  He did have MRIs obtained of bilateral knees which did show a chronic appearing ACL tear on the right knee.  With regard to his pain he is experiencing this deep in the joint.  He is not able to kneel on either knee or work on his classic car.  He has been using NSAIDs as well as compression with limited relief.  The pain is predominantly central.  Denies any instability or feeling like the right knee is giving out.  He has previously had injections as well as hyaluronic acid without significant relief.  He is here today as referral from my partner Dr. Magnus Ivan.    Surgical History:   None  PMH/PSH/Family History/Social History/Meds/Allergies:    Past Medical History:  Diagnosis Date  . Allergy   . Arthritis 2022   knees  . Hx of colonic polyp 2007  . Hyperlipidemia   . Insomnia 06/02/2014  . Seizures (HCC) 1983  . Syncope    in 20s; no etiology, no recurrence. Dilantin Rxed   Past Surgical History:  Procedure Laterality Date  . colonoscopy with polypectomy  2007   Dr Leone Payor, 4 mm rectal polyp  . DENTAL SURGERY     bone grafts, to get implants  . NASAL FRACTURE SURGERY    . TONSILLECTOMY AND ADENOIDECTOMY     Social History   Socioeconomic History  . Marital status: Married    Spouse name: Not on file  . Number of children: 0  . Years of education: Not on file  . Highest education level: Some college, no degree  Occupational History  . Occupation: part Scientist, physiological, 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, sports  Tobacco Use  . Smoking status: Never  . Smokeless tobacco: Never  Substance and Sexual Activity  . Alcohol use: Yes    Alcohol/week: 6.0 - 8.0 standard drinks of  alcohol    Types: 6 - 8 Cans of beer per week    Comment: 2-3  beers qd  . Drug use: Not Currently    Types: PCP  . Sexual activity: Not Currently  Other Topics Concern  . Not on file  Social History Narrative   Household pt and wife   Social Determinants of Health   Financial Resource Strain: Low Risk  (08/25/2022)   Overall Financial Resource Strain (CARDIA)   . Difficulty of Paying Living Expenses: Not hard at all  Food Insecurity: No Food Insecurity (08/25/2022)   Hunger Vital Sign   . Worried About Programme researcher, broadcasting/film/video in the Last Year: Never true   . Ran Out of Food in the Last Year: Never true  Transportation Needs: No Transportation Needs (08/25/2022)   PRAPARE - Transportation   . Lack of Transportation (Medical): No   . Lack of Transportation (Non-Medical): No  Physical Activity: Insufficiently Active (08/25/2022)   Exercise Vital Sign   . Days of Exercise per Week: 2 days   . Minutes of Exercise per Session: 20 min  Stress: No Stress Concern Present (  08/25/2022)   [employer] of Occupational Health - Occupational Stress Questionnaire   . Feeling of Stress : Not at all  Social Connections: Unknown (08/25/2022)   Social Connection and Isolation Panel [NHANES]   . Frequency of Communication with Friends and Family: Three times a week   . Frequency of Social Gatherings with Friends and Family: More than three times a week   . Attends Religious Services: Patient declined   . Active Member of Clubs or Organizations: Yes   . Attends Banker Meetings: More than 4 times per year   . Marital Status: Married   Family History  Problem Relation Age of Onset  . Macular degeneration Father   . Cancer Mother        throat; smoker  . Macular degeneration Paternal Uncle   . Rectal cancer Other        maternal cousin  . Colon cancer Neg Hx   . Esophageal cancer Neg Hx   . Stomach cancer Neg Hx   . Stroke Neg Hx   . Heart disease Neg Hx   . Diabetes Neg Hx   .  Prostate cancer Neg Hx    No Known Allergies Current Outpatient Medications  Medication Sig Dispense Refill  . acetaminophen (TYLENOL) 650 MG CR tablet Take 650 mg by mouth 2 (two) times daily as needed for pain.    Marland Kitchen ALPRAZolam (XANAX) 0.25 MG tablet TAKE 2 TABLETS(0.5 MG) BY MOUTH AT BEDTIME AS NEEDED FOR SLEEP 60 tablet 2  . calcium carbonate (OS-CAL) 600 MG TABS Take 1,200 mg by mouth daily.    . diclofenac Sodium (VOLTAREN) 1 % GEL Apply 2 g topically 4 (four) times daily.    Marland Kitchen DILANTIN 100 MG ER capsule TAKE 5 CAPSULES DAILY AS   DIRECTED 450 capsule 1  . ezetimibe (ZETIA) 10 MG tablet Take 1 tablet (10 mg total) by mouth daily. 90 tablet 1  . fexofenadine (ALLEGRA) 180 MG tablet Take 180 mg by mouth daily.    . fluocinonide gel (LIDEX) 0.05 % Apply 1 application topically 3 (three) times daily as needed.     . Melatonin 10 MG TABS Take by mouth.    . naproxen (NAPROSYN) 500 MG tablet TAKE 1 TABLET(500 MG) BY MOUTH DAILY AS NEEDED FOR MODERATE PAIN 30 tablet 3  . Omega-3 Fatty Acids (FISH OIL) 300 MG CAPS Take by mouth. (Mega Red)    . Probiotic Product (PROBIOTIC ADVANCED PO) Take by mouth.    . rosuvastatin (CRESTOR) 10 MG tablet Take 1 tablet (10 mg total) by mouth daily. 90 tablet 1  . TURMERIC PO Take by mouth.     No current facility-administered medications for this visit.   No results found.  Review of Systems:   A ROS was performed including pertinent positives and negatives as documented in the HPI.  Physical Exam :   Constitutional: NAD and appears stated age Neurological: Alert and oriented Psych: Appropriate affect and cooperative There were no vitals taken for this visit.   Comprehensive Musculoskeletal Exam:      Musculoskeletal Exam  Gait Normal  Alignment Normal   Right Left  Inspection Normal Normal  Palpation    Tenderness Hoffa's fat pad Hoffa's fat pad  Crepitus None None  Effusion None none  Range of Motion    Extension -3 -3  Flexion 1351  135  Strength    Extension 5/5 5/5  Flexion 5/5 5/5  Ligament Exam     Generalized  Laxity No No  Lachman Negative Negative   Pivot Shift Positive Negative  Anterior Drawer Positive Negative  Valgus at 0 Negative Negative  Valgus at 20 Negative Negative  Varus at 0 0 0  Varus at 20   0 0  Posterior Drawer at 90 0 0  Vascular/Lymphatic Exam    Edema None None  Venous Stasis Changes No No  Distal Circulation Normal Normal  Neurologic    Light Touch Sensation Intact Intact  Special Tests:      Imaging:   Xray (4 views right knee, 4 views left knee): Normal  MRI (right knee, left knee): Complete tear of the right ACL with anterior tibial translation, normal left knee.  There is signal intensity at the insertion of bilateral patella tendon  I personally reviewed and interpreted the radiographs.   Assessment:   67 y.o. male with evidence of extensor mechanism pain in the setting of Hoffa fat pad irritation.  I did describe that this is fairly consistent as he is experiencing the pain on the left just as bad as the right despite not having an ACL on the right.  I did describe that not having intact ACL would put him at risk for further irritation of the Hoffa's fat pad irritation although I do believe that this is likely the predominant cause of his pain.  At this point he has been actually doing quite well without an ACL and he does not have any evidence of progressive arthritis despite having an ACL deficiency.  He does have quite a significant positive Lachman on exam although again instability is not his predominant complaint.  Given the fact that his symptoms appear to be emanating predominantly from the extensor mechanism and the Hoffa's fat pad I have recommended ultrasound-guided injections of bilateral Hoffa fat pad.  I would also like to get him enrolled in physical therapy for strengthening of the hips and quads.  Plan :    -Bilateral ultrasound-guided Hoffa's fat pad  injection performed, return to clinic 1 month for reassessment    Procedure Note  Patient: Jewel Curtright.             Date of Birth: 1955-07-19           MRN: [id number]             Visit Date: 11/25/2022  Procedures: Visit Diagnoses: No diagnosis found.  Large Joint Inj: R knee on 11/25/2022 11:24 AM Indications: pain Details: 22 G 1.5 in needle, ultrasound-guided anterior approach  Arthrogram: No  Medications: 4 mL lidocaine 1 %; 80 mg triamcinolone acetonide 40 MG/ML Outcome: tolerated well, no immediate complications Procedure, treatment alternatives, risks and benefits explained, specific risks discussed. Consent was given by the patient. Immediately prior to procedure a time out was called to verify the correct patient, procedure, equipment, support staff and site/side marked as required. Patient was prepped and draped in the usual sterile fashion.    Large Joint Inj: L knee on 11/25/2022 11:24 AM Indications: pain Details: 22 G 1.5 in needle, ultrasound-guided anterior approach  Arthrogram: No  Medications: 4 mL lidocaine 1 %; 80 mg triamcinolone acetonide 40 MG/ML Outcome: tolerated well, no immediate complications Procedure, treatment alternatives, risks and benefits explained, specific risks discussed. Consent was given by the patient. Immediately prior to procedure a time out was called to verify the correct patient, procedure, equipment, support staff and site/side marked as required. Patient was prepped and draped in the usual sterile fashion.  I personally saw and evaluated the patient, and participated in the management and treatment plan.  Huel Cote, MD Attending Physician, Orthopedic Surgery  This document was dictated using Dragon voice recognition software. A reasonable attempt at proof reading has been made to minimize errors.

## 2022-11-28 ENCOUNTER — Other Ambulatory Visit: Payer: Self-pay

## 2022-11-28 ENCOUNTER — Ambulatory Visit (INDEPENDENT_AMBULATORY_CARE_PROVIDER_SITE_OTHER): Payer: Medicare Other | Admitting: Physical Therapy

## 2022-11-28 ENCOUNTER — Encounter: Payer: Self-pay | Admitting: Physical Therapy

## 2022-11-28 DIAGNOSIS — R600 Localized edema: Secondary | ICD-10-CM | POA: Diagnosis not present

## 2022-11-28 DIAGNOSIS — M25561 Pain in right knee: Secondary | ICD-10-CM

## 2022-11-28 DIAGNOSIS — M25562 Pain in left knee: Secondary | ICD-10-CM | POA: Diagnosis not present

## 2022-11-28 DIAGNOSIS — M6281 Muscle weakness (generalized): Secondary | ICD-10-CM

## 2022-11-28 DIAGNOSIS — G8929 Other chronic pain: Secondary | ICD-10-CM

## 2022-11-28 NOTE — Therapy (Signed)
OUTPATIENT PHYSICAL THERAPY LOWER EXTREMITY EVALUATION   Patient Name: Kenneth [name]. MRN: [id number] DOB:28-Jul-1955, 67 y.o., male Today's Date: 11/28/2022  END OF SESSION:  PT End of Session - 11/28/22 1512     Visit Number 1    Number of Visits 8    Date for PT Re-Evaluation 01/09/23    PT Start Time 1430    PT Stop Time 1510    PT Time Calculation (min) 40 min             Past Medical History:  Diagnosis Date   Allergy    Arthritis 2022   knees   Hx of colonic polyp 2007   Hyperlipidemia    Insomnia 06/02/2014   Seizures (HCC) 1983   Syncope    in 20s; no etiology, no recurrence. Dilantin Rxed   Past Surgical History:  Procedure Laterality Date   colonoscopy with polypectomy  2007   Dr Leone Payor, 4 mm rectal polyp   DENTAL SURGERY     bone grafts, to get implants   NASAL FRACTURE SURGERY     TONSILLECTOMY AND ADENOIDECTOMY     Patient Active Problem List   Diagnosis Date Noted   Fatty liver 03/07/2022   Alcohol use 03/07/2022   Hyperglycemia 08/21/2018   PCP NOTES >>>>>>>>>>>>>>>>> 07/19/2015   DJD (degenerative joint disease) 11/30/2014   Colon cancer screening 06/02/2014   Insomnia 06/02/2014   Nonspecific abnormal electrocardiogram (ECG) (EKG) 01/24/2012   Syncope  ---> remote, on Dilantin since the 80s. 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 It is keeping him from sleeping and the pain at night is his biggest complaint. He had injections which only helped temporarily.   PERTINENT HISTORY: Seizures, syncope, chronic ACL tear PAIN:  NPRS scale: 2/10 during the day mostly but at night 6-7 Pain location:anterior knee and when really aggravated its all around the knee Pain description: constant, tight, feel warm to touch Aggravating factors: doing too much, getting on his knees, worse at night Relieving factors: rest, ice   PRECAUTIONS: None   WEIGHT BEARING RESTRICTIONS: No  FALLS:  Has patient fallen in last 6 months? No  OCCUPATION: retired  PLOF: Independent  PATIENT GOALS: reduce pain  NEXT MD VISIT: 01/01/23  OBJECTIVE:   DIAGNOSTIC FINDINGS:  Xray (4 views right knee, 4 views left knee): Normal   MRI (right knee, left knee): Complete tear of the right ACL with anterior tibial translation, normal left knee.  There is signal intensity at the insertion of bilateral patella tendon  PATIENT SURVEYS:  Eval FOTO 51% functional, predicted goal is 56%  COGNITION: Overall cognitive status: Within functional limits for tasks assessed      EDEMA:  Mild swelling in knees  MUSCLE LENGTH: Hamstrings: mildly tight bilat Thomas test: mildly tight bilat   LOWER EXTREMITY ROM:  Active ROM Right  eval Left eval  Hip flexion    Hip extension    Hip abduction    Hip adduction    Hip internal rotation    Hip external rotation    Knee flexion WNL WNL  Knee extension WNL WNL  Ankle dorsiflexion    Ankle plantarflexion    Ankle inversion    Ankle eversion     (Blank rows = not tested)  LOWER EXTREMITY MMT:  MMT Right eval Left eval  Hip flexion 4+ 4+  Hip extension    Hip abduction 4+ 4+  Hip adduction    Hip internal rotation    Hip external rotation    Knee flexion 5 5  Knee extension 5 5  Ankle dorsiflexion    Ankle plantarflexion    Ankle inversion    Ankle  eversion     (Blank rows = not tested)  LOWER EXTREMITY SPECIAL TESTS:    FUNCTIONAL TESTS:  Eval: He can stand up from chair without UE support  GAIT: Eval Comments: WFL gait, but pain with prolonged walking at times   TODAY'S TREATMENT:  Eval HEP creation and review with demonstration and trial set preformed, see below for details Manual therapy: KT tape to bilat knees one vertical I strip on patella tendon and one horizontal I strip at knee joint line. Education and verbal instructions provided     PATIENT EDUCATION: Education details: HEP, PT plan of care, KT tape Person educated: Patient Education method: Explanation, Demonstration, Verbal cues, and Handouts Education comprehension: verbalized understanding and needs further education   HOME EXERCISE PROGRAM: Access Code: [email or web address] URL: [email or web address] Date: 11/28/2022 Prepared by: Ivery Quale  Exercises - Supine Quadriceps Stretch with Strap on Table  - 2 x daily - 6 x weekly - 3 reps - 30 sec hold - Seated Straight Leg Raise with Quad Contraction  - 2 x daily - 6 x weekly - 3 sets - 10 reps - Seated Knee Extension with Resistance  - 2 x daily - 6 x weekly - 3 sets - 10 reps - Sit to Stand Without Arm Support  - 2 x daily - 6 x weekly - 2 sets - 10 reps  ASSESSMENT:  CLINICAL IMPRESSION: Patient referred to PT for chronic bilateral knee pain. MD impression is "symptoms appear to be emanating predominantly from the extensor mechanism and the Hoffa's fat pad I have recommended ultrasound-guided injections of bilateral Hoffa fat pad.  I would also like to get him enrolled in physical therapy for strengthening of the hips and quads." Patient will benefit from skilled PT to address below impairments, limitations and improve overall function.  OBJECTIVE IMPAIRMENTS: decreased activity tolerance, difficulty walking, decreased mobility, decreased strength, impaired flexibility, impaired LE use ACTIVITY  LIMITATIONS: bending, lifting, carry, locomotion, cleaning, community activity, driving, and or occupation  PERSONAL FACTORS: chroinic knee pain with chronic ACL tear are also affecting patient's functional outcome.  REHAB POTENTIAL: Fair    CLINICAL DECISION MAKING: Stable/uncomplicated  EVALUATION COMPLEXITY: Low    GOALS: Short term PT Goals Target date: 12/26/2022   Pt will be I and compliant with HEP. Baseline:  Goal status: New Pt will decrease pain at night by 25% overall Baseline:7 on avg at night Goal status: New  Long term PT goals Target date:01/09/2023   Pt will improve  hip/knee strength to 5/5 MMT to improve functional strength Baseline: Goal status: New Pt will improve FOTO to at least 56% functional to show improved function Baseline: Goal status: New  Pt will reduce pain by overall 50% overall with usual activity and night pain Baseline:7 Goal status: New   PLAN: PT FREQUENCY: 1-2 times per week   PT DURATION: 4-6 weeks  PLANNED INTERVENTIONS (unless contraindicated): aquatic PT, Canalith repositioning, cryotherapy, Electrical stimulation, Iontophoresis with 4 mg/ml dexamethasome, Moist heat, traction, Ultrasound, gait training, Therapeutic exercise, balance training, neuromuscular re-education, patient/family education, prosthetic training, manual techniques, passive ROM, dry needling, taping, vasopnuematic device, vestibular, spinal manipulations, joint manipulations  PLAN FOR NEXT SESSION: how was KT tape, how is HEP? Needs quad eccentric strength focus and stretching, consider DN or ionto.     April Manson, PT,DPT 11/28/2022, 3:13 PM

## 2022-12-12 ENCOUNTER — Ambulatory Visit (INDEPENDENT_AMBULATORY_CARE_PROVIDER_SITE_OTHER): Payer: Medicare Other | Admitting: Physical Therapy

## 2022-12-12 ENCOUNTER — Encounter: Payer: Self-pay | Admitting: Physical Therapy

## 2022-12-12 DIAGNOSIS — G8929 Other chronic pain: Secondary | ICD-10-CM

## 2022-12-12 DIAGNOSIS — M25562 Pain in left knee: Secondary | ICD-10-CM | POA: Diagnosis not present

## 2022-12-12 DIAGNOSIS — R600 Localized edema: Secondary | ICD-10-CM | POA: Diagnosis not present

## 2022-12-12 DIAGNOSIS — M25561 Pain in right knee: Secondary | ICD-10-CM

## 2022-12-12 DIAGNOSIS — M6281 Muscle weakness (generalized): Secondary | ICD-10-CM

## 2022-12-12 NOTE — Therapy (Signed)
OUTPATIENT PHYSICAL THERAPY TREATMENT   Patient Name: Kenneth [name]. MRN: [id number] DOB:Nov 23, 1955, 67 y.o., male Today's Date: 12/12/2022  END OF SESSION:  PT End of Session - 12/12/22 1314     Visit Number 2    Number of Visits 8    Date for PT Re-Evaluation 01/09/23    PT Start Time 1303    PT Stop Time 1345    PT Time Calculation (min) 42 min    Activity Tolerance Patient tolerated treatment well    Behavior During Therapy [facility] for tasks assessed/performed             Past Medical History:  Diagnosis Date   Allergy    Arthritis 2022   knees   Hx of colonic polyp 2007   Hyperlipidemia    Insomnia 06/02/2014   Seizures (HCC) 1983   Syncope    in 20s; no etiology, no recurrence. Dilantin Rxed   Past Surgical History:  Procedure Laterality Date   colonoscopy with polypectomy  2007   Dr Leone Payor, 4 mm rectal polyp   DENTAL SURGERY     bone grafts, to get implants   NASAL FRACTURE SURGERY     TONSILLECTOMY AND ADENOIDECTOMY     Patient Active Problem List   Diagnosis Date Noted   Fatty liver 03/07/2022   Alcohol use 03/07/2022   Hyperglycemia 08/21/2018   PCP NOTES >>>>>>>>>>>>>>>>> 07/19/2015   DJD (degenerative joint disease) 11/30/2014   Colon cancer screening 06/02/2014   Insomnia 06/02/2014   Nonspecific abnormal electrocardiogram (ECG) (EKG) 01/24/2012   Syncope  ---> remote, on Dilantin since the 80s. 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   PERTINENT HISTORY: Seizures, syncope, chronic ACL tear PAIN:  NPRS scale: 5 at night Pain location:anterior knee and when really aggravated its all around the knee Pain description: constant, tight, feel warm to touch Aggravating factors: doing too much, getting on his knees, worse at night Relieving factors: rest, ice   PRECAUTIONS: None   WEIGHT BEARING RESTRICTIONS: No  FALLS:  Has patient fallen in last 6 months? No  OCCUPATION: retired  PLOF: Independent  PATIENT GOALS: reduce pain  NEXT MD VISIT: 01/01/23  OBJECTIVE:   DIAGNOSTIC FINDINGS:  Xray (4 views right knee, 4 views left knee): Normal   MRI (right knee, left knee): Complete tear of the right ACL with anterior tibial translation, normal left knee.  There is signal intensity at the insertion of bilateral patella tendon  PATIENT SURVEYS:  Eval FOTO 51% functional, predicted goal is 56%  COGNITION: Overall cognitive status: Within functional limits for tasks assessed      EDEMA:  Mild swelling in knees  MUSCLE LENGTH: Hamstrings: mildly tight bilat Thomas test: mildly tight bilat   LOWER EXTREMITY ROM:  Active ROM Right eval Left eval  Hip  flexion    Hip extension    Hip abduction    Hip adduction    Hip internal rotation    Hip external rotation    Knee flexion WNL WNL  Knee extension WNL WNL  Ankle dorsiflexion    Ankle plantarflexion    Ankle inversion    Ankle eversion     (Blank rows = not tested)  LOWER EXTREMITY MMT:  MMT Right eval Left eval  Hip flexion 4+ 4+  Hip extension    Hip abduction 4+ 4+  Hip adduction    Hip internal rotation    Hip external rotation    Knee flexion 5 5  Knee extension 5 5  Ankle dorsiflexion    Ankle plantarflexion    Ankle inversion    Ankle eversion     (Blank rows = not  tested)  LOWER EXTREMITY SPECIAL TESTS:    FUNCTIONAL TESTS:  Eval: He can stand up from chair without UE support  GAIT: Eval Comments: WFL gait, but pain with prolonged walking at times   TODAY'S TREATMENT:  12/12/22 Recumbent bike L3 X 8 min Standing quad stretch with strap 30 sec X 3 bilat Leg press DL 401# [id number], then SL 53# 2X10 each side Leg extension machine DL 66# [id number]  Manual therapy: Ice massage 3 min each knee KT tape to bilat knees 3 vertical I strip on patella tendon and around patella on each side, one horizontal I strip at knee joint line. Education and verbal instructions provided    Eval HEP creation and review with demonstration and trial set preformed, see below for details Manual therapy: KT tape to bilat knees one vertical I strip on patella tendon and one horizontal I strip at knee joint line. Education and verbal instructions provided     PATIENT EDUCATION: Education details: HEP, PT plan of care, KT tape Person educated: Patient Education method: Explanation, Demonstration, Verbal cues, and Handouts Education comprehension: verbalized understanding and needs further education   HOME EXERCISE PROGRAM: Access Code: [email or web address] URL: [email or web address] Date: 11/28/2022 Prepared by: Ivery Quale  Exercises - Supine Quadriceps Stretch with Strap on Table  - 2 x daily - 6 x weekly - 3 reps - 30 sec hold - Seated Straight Leg Raise with Quad Contraction  - 2 x daily - 6 x weekly - 3 sets - 10 reps - Seated Knee Extension with Resistance  - 2 x daily - 6 x weekly - 3 sets - 10 reps - Sit to Stand Without Arm Support  - 2 x daily - 6 x weekly - 2 sets - 10 reps  ASSESSMENT:  CLINICAL IMPRESSION: He does endorse some improvements after PT exercises and the KT tape. He bought KT tape for home and has been using it. I did also show him additional KT tape technique to try as well as ice massage when his pain comes back around at night.    OBJECTIVE IMPAIRMENTS: decreased activity tolerance, difficulty walking, decreased mobility, decreased strength, impaired flexibility, impaired LE use ACTIVITY LIMITATIONS: bending, lifting, carry, locomotion, cleaning, community activity, driving, and or occupation  PERSONAL FACTORS: chroinic knee pain with chronic ACL tear are also affecting patient's functional outcome.  REHAB POTENTIAL: Fair    CLINICAL DECISION MAKING: Stable/uncomplicated  EVALUATION COMPLEXITY: Low    GOALS: Short term PT Goals Target date: 12/26/2022   Pt will be I and compliant with HEP. Baseline:  Goal status: New Pt will decrease pain at night by 25% overall Baseline:7 on  avg at night Goal status: New  Long term PT goals Target date:01/09/2023   Pt will improve  hip/knee strength to 5/5 MMT to improve functional strength Baseline: Goal status: New Pt will improve FOTO to at least 56% functional to show improved function Baseline: Goal status: New Pt will reduce pain by overall 50% overall with usual activity and night pain Baseline:7 Goal status: New   PLAN: PT FREQUENCY: 1-2 times per week   PT DURATION: 4-6 weeks  PLANNED INTERVENTIONS (unless contraindicated): aquatic PT, Canalith repositioning, cryotherapy, Electrical stimulation, Iontophoresis with 4 mg/ml dexamethasome, Moist heat, traction, Ultrasound, gait training, Therapeutic exercise, balance training, neuromuscular re-education, patient/family education, prosthetic training, manual techniques, passive ROM, dry needling, taping, vasopnuematic device, vestibular, spinal manipulations, joint manipulations  PLAN FOR NEXT SESSION: how was KT tape, how is HEP? Needs quad eccentric strength focus and stretching, consider DN or ionto.     April Manson, PT,DPT 12/12/2022, 1:16 PM

## 2022-12-18 ENCOUNTER — Encounter: Payer: Medicare Other | Admitting: Physical Therapy

## 2022-12-18 ENCOUNTER — Telehealth: Payer: Self-pay | Admitting: Physical Therapy

## 2022-12-18 NOTE — Telephone Encounter (Signed)
Pt did not show for PT appointment today. They were contacted and informed of this and he thought his appointment was for the next day. I confirmed his next appointment for him and he states he will be there.  Ivery Quale, PT, DPT 12/18/22 12:32 PM

## 2022-12-24 ENCOUNTER — Ambulatory Visit (INDEPENDENT_AMBULATORY_CARE_PROVIDER_SITE_OTHER): Payer: Medicare Other | Admitting: Physical Therapy

## 2022-12-24 ENCOUNTER — Encounter: Payer: Self-pay | Admitting: Physical Therapy

## 2022-12-24 DIAGNOSIS — G8929 Other chronic pain: Secondary | ICD-10-CM

## 2022-12-24 DIAGNOSIS — M25562 Pain in left knee: Secondary | ICD-10-CM

## 2022-12-24 DIAGNOSIS — M25561 Pain in right knee: Secondary | ICD-10-CM

## 2022-12-24 DIAGNOSIS — M6281 Muscle weakness (generalized): Secondary | ICD-10-CM | POA: Diagnosis not present

## 2022-12-24 DIAGNOSIS — R600 Localized edema: Secondary | ICD-10-CM | POA: Diagnosis not present

## 2022-12-24 NOTE — Therapy (Addendum)
OUTPATIENT PHYSICAL THERAPY TREATMENT  / DISCHARGE   Patient Name: Kenneth [name]. MRN: [id number] DOB:January 24, 1956, 67 y.o., male Today's Date: 12/24/2022  END OF SESSION:  PT End of Session - 12/24/22 1310     Visit Number 3    Number of Visits 8    Date for PT Re-Evaluation 01/09/23    PT Start Time 1300    PT Stop Time 1340    PT Time Calculation (min) 40 min    Activity Tolerance Patient tolerated treatment well    Behavior During Therapy [facility] for tasks assessed/performed             Past Medical History:  Diagnosis Date   Allergy    Arthritis 2022   knees   Hx of colonic polyp 2007   Hyperlipidemia    Insomnia 06/02/2014   Seizures (HCC) 1983   Syncope    in 20s; no etiology, no recurrence. Dilantin Rxed   Past Surgical History:  Procedure Laterality Date   colonoscopy with polypectomy  2007   Dr Leone Payor, 4 mm rectal polyp   DENTAL SURGERY     bone grafts, to get implants   NASAL FRACTURE SURGERY     TONSILLECTOMY AND ADENOIDECTOMY     Patient Active Problem List   Diagnosis Date Noted   Fatty liver 03/07/2022   Alcohol use 03/07/2022   Hyperglycemia 08/21/2018   PCP NOTES >>>>>>>>>>>>>>>>> 07/19/2015   DJD (degenerative joint disease) 11/30/2014   Colon cancer screening 06/02/2014   Insomnia 06/02/2014   Nonspecific abnormal electrocardiogram (ECG) (EKG) 01/24/2012   Syncope  ---> remote, on Dilantin since the 80s. 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 He has been taping himself the way PT showed him  PERTINENT HISTORY: Seizures, syncope, chronic ACL tear PAIN:  NPRS scale: 5 at night Pain location:anterior knee and when really aggravated its all around the knee Pain description: constant, tight, feel warm to touch Aggravating factors: doing too much, getting on his knees, worse at night Relieving factors: rest, ice   PRECAUTIONS: None   WEIGHT BEARING RESTRICTIONS: No  FALLS:  Has patient fallen in last 6 months? No  OCCUPATION: retired  PLOF: Independent  PATIENT GOALS: reduce pain  NEXT MD VISIT: 01/01/23  OBJECTIVE:   DIAGNOSTIC FINDINGS:  Xray (4 views right knee, 4 views left knee): Normal   MRI (right knee, left knee): Complete tear of the right ACL with anterior tibial translation, normal left knee.  There is signal intensity at the insertion of bilateral patella tendon  PATIENT SURVEYS:  Eval FOTO 51% functional, predicted goal is 56%  COGNITION: Overall cognitive status: Within functional limits for tasks assessed      EDEMA:  Mild swelling in knees  MUSCLE LENGTH: Hamstrings: mildly tight bilat Thomas test: mildly tight bilat  LOWER EXTREMITY ROM:  Active ROM Right eval Left eval  Hip flexion    Hip extension    Hip abduction    Hip adduction    Hip internal rotation    Hip external rotation    Knee flexion WNL WNL  Knee extension WNL WNL  Ankle dorsiflexion    Ankle plantarflexion    Ankle inversion    Ankle eversion     (Blank rows = not tested)  LOWER EXTREMITY MMT:  MMT Right eval Left eval  Hip flexion 4+ 4+  Hip extension    Hip abduction 4+ 4+  Hip adduction    Hip internal rotation    Hip external rotation    Knee flexion 5 5  Knee extension 5 5  Ankle dorsiflexion    Ankle plantarflexion    Ankle  inversion    Ankle eversion     (Blank rows = not tested)  LOWER EXTREMITY SPECIAL TESTS:    FUNCTIONAL TESTS:  Eval: He can stand up from chair without UE support  GAIT: Eval Comments: WFL gait, but pain with prolonged walking at times   TODAY'S TREATMENT:  12/24/22 Recumbent bike L3 X 8 min Slantboard stretch 30 sec X 3 Standing quad stretch with strap 30 sec X 3 bilat Leg press DL 409# [id number], then SL 14# 2X10 each side Leg extension machine DL 78# [id number] Hamstring curl machine DL 21# [id number]  Iontophoresis 4-6 hour wear home patch with 1.0CC dexamethasone and saline to bilat anterior knees at patella tendon insertion. Verbal instructions and education provided.    12/12/22 Recumbent bike L3 X 8 min Standing quad stretch with strap 30 sec X 3 bilat Leg press DL 578# [id number], then SL 29# 2X10 each side Leg extension machine DL 52# [id number]  Manual therapy: Ice massage 3 min each knee KT tape to bilat knees 3 vertical I strip on patella tendon and around patella on each side, one horizontal I strip at knee joint line. Education and verbal instructions provided    Eval HEP creation and review with demonstration and trial set preformed, see below for details Manual therapy: KT tape to bilat knees one vertical I strip on patella tendon and one horizontal I strip at knee joint line. Education and verbal instructions provided     PATIENT EDUCATION: Education details: HEP, PT plan of care, KT tape Person educated: Patient Education method: Explanation, Demonstration, Verbal cues, and Handouts Education comprehension: verbalized understanding and needs further education   HOME EXERCISE PROGRAM: Access Code: [email or web address] URL: [email or web address] Date: 11/28/2022 Prepared by: Ivery Quale  Exercises - Supine Quadriceps Stretch with Strap on Table  - 2 x daily - 6 x weekly - 3 reps - 30 sec hold - Seated Straight Leg Raise with Quad Contraction  - 2 x daily - 6 x  weekly - 3 sets - 10 reps - Seated Knee Extension with Resistance  - 2 x daily - 6 x weekly - 3 sets - 10 reps - Sit to Stand Without Arm Support  - 2 x daily - 6 x weekly - 2 sets - 10 reps  ASSESSMENT:  CLINICAL IMPRESSION: He has had some relief with KT tape but continues to have most of his knee pain at night with sleeping. I did try iontophoresis today to see if this will help any and he will follow up with MD next week.   OBJECTIVE IMPAIRMENTS: decreased activity tolerance, difficulty walking, decreased mobility, decreased strength, impaired flexibility, impaired LE use  ACTIVITY LIMITATIONS: bending, lifting, carry, locomotion, cleaning, community activity, driving, and or occupation  PERSONAL FACTORS: chroinic knee pain with chronic ACL tear are also affecting patient's functional outcome.  REHAB POTENTIAL: Fair    CLINICAL DECISION MAKING: Stable/uncomplicated  EVALUATION COMPLEXITY: Low    GOALS: Short term PT Goals Target date: 12/26/2022   Pt will be I and compliant with HEP. Baseline:  Goal status: MET 12/24/22 Pt will decrease pain at night by 25% overall Baseline:7 on avg at night Goal status: MET if he uses KT tape  Long term PT goals Target date:01/09/2023   Pt will improve  hip/knee strength to 5/5 MMT to improve functional strength Baseline: Goal status: ongoing 12/24/22 Pt will improve FOTO to at least 56% functional to show improved function Baseline: Goal status: ongoing 12/24/22 Pt will reduce pain by overall 50% overall with usual activity and night pain Baseline:7 Goal status: ongoing 12/24/22   PLAN: PT FREQUENCY: 1-2 times per week   PT DURATION: 4-6 weeks  PLANNED INTERVENTIONS (unless contraindicated): aquatic PT, Canalith repositioning, cryotherapy, Electrical stimulation, Iontophoresis with 4 mg/ml dexamethasome, Moist heat, traction, Ultrasound, gait training, Therapeutic exercise, balance training, neuromuscular re-education, patient/family  education, prosthetic training, manual techniques, passive ROM, dry needling, taping, vasopnuematic device, vestibular, spinal manipulations, joint manipulations  PLAN FOR NEXT SESSION: how was ionto?,  Needs quad eccentric strength focus and stretching, consider DN   April Manson, PT,DPT 12/24/2022, 1:10 PM   PHYSICAL THERAPY DISCHARGE SUMMARY  Visits from Start of Care: 3  Current functional level related to goals / functional outcomes: See note   Remaining deficits: See note   Education / Equipment: HEP  Patient goals were partially met. Patient is being discharged due to not returning since the last visit.  Chyrel Masson, PT, DPT, OCS, ATC 01/24/23  10:34 AM

## 2023-01-01 ENCOUNTER — Ambulatory Visit (INDEPENDENT_AMBULATORY_CARE_PROVIDER_SITE_OTHER): Payer: Medicare Other | Admitting: Orthopaedic Surgery

## 2023-01-01 DIAGNOSIS — E8889 Other specified metabolic disorders: Secondary | ICD-10-CM

## 2023-01-01 NOTE — Progress Notes (Signed)
Chief Complaint: Right knee pain     History of Present Illness:   01/01/2023: Presents today for follow-up of his bilateral knees.  He has been getting significant relief from his injections.  He has been trialing a strap which is quite helpful  Kenneth [name]. is a 67 y.o. male presents today with bilateral knee pain that has been ongoing now for many years.  He has been seeing Dr. Jean Rosenthal as well as Dr. Magnus Ivan.  He did have MRIs obtained of bilateral knees which did show a chronic appearing ACL tear on the right knee.  With regard to his pain he is experiencing this deep in the joint.  He is not able to kneel on either knee or work on his classic car.  He has been using NSAIDs as well as compression with limited relief.  The pain is predominantly central.  Denies any instability or feeling like the right knee is giving out.  He has previously had injections as well as hyaluronic acid without significant relief.  He is here today as referral from my partner Dr. Magnus Ivan.    Surgical History:   None  PMH/PSH/Family History/Social History/Meds/Allergies:    Past Medical History:  Diagnosis Date   Allergy    Arthritis 2022   knees   Hx of colonic polyp 2007   Hyperlipidemia    Insomnia 06/02/2014   Seizures (HCC) 1983   Syncope    in 20s; no etiology, no recurrence. 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, sports   Tobacco Use   Smoking status: Never   Smokeless tobacco: Never  Substance and Sexual Activity   Alcohol use: Yes    Alcohol/week: 6.0 - 8.0 standard drinks of alcohol    Types: 6 - 8 Cans of beer per week    Comment: 2-3  beers qd   Drug use: Not Currently    Types: PCP   Sexual activity: Not Currently  Other Topics Concern   Not on file  Social History Narrative   Household pt and wife   Social Determinants of Health   Financial Resource Strain: Low Risk  (08/25/2022)   Overall Financial Resource Strain (CARDIA)    Difficulty of Paying Living Expenses: Not hard at all  Food Insecurity: No Food Insecurity (08/25/2022)   Hunger Vital Sign    Worried About Running Out of Food in the Last Year: Never true    Ran Out of Food in the Last Year: Never true  Transportation Needs: No Transportation Needs (08/25/2022)   PRAPARE - Administrator, Civil Service (Medical): No    Lack of Transportation (Non-Medical): No  Physical Activity: Insufficiently Active (08/25/2022)  Exercise Vital Sign    Days of Exercise per Week: 2 days    Minutes of Exercise per Session: 20 min  Stress: No Stress Concern Present (08/25/2022)   [employer] of Occupational Health - Occupational Stress Questionnaire    Feeling of Stress : Not at all  Social Connections: Unknown (08/25/2022)   Social Connection and Isolation Panel [NHANES]    Frequency of Communication with Friends and Family: Three times a week    Frequency of Social Gatherings with Friends and Family: More than three times a week    Attends Religious Services: Patient declined    Database administrator or Organizations: Yes    Attends Engineer, structural: More than 4 times per year    Marital Status: Married   Family History  Problem Relation Age of Onset   Macular degeneration Father    Cancer Mother        throat; smoker   Macular degeneration Paternal Uncle    Rectal cancer Other        maternal cousin   Colon  cancer Neg Hx    Esophageal cancer Neg Hx    Stomach cancer Neg Hx    Stroke Neg Hx    Heart disease Neg Hx    Diabetes Neg Hx    Prostate cancer Neg Hx    No Known Allergies Current Outpatient Medications  Medication Sig Dispense Refill   acetaminophen (TYLENOL) 650 MG CR tablet Take 650 mg by mouth 2 (two) times daily as needed for pain.     ALPRAZolam (XANAX) 0.25 MG tablet TAKE 2 TABLETS(0.5 MG) BY MOUTH AT BEDTIME AS NEEDED FOR SLEEP 60 tablet 2   calcium carbonate (OS-CAL) 600 MG TABS Take 1,200 mg by mouth daily.     diclofenac Sodium (VOLTAREN) 1 % GEL Apply 2 g topically 4 (four) times daily.     DILANTIN 100 MG ER capsule TAKE 5 CAPSULES DAILY AS   DIRECTED 450 capsule 1   ezetimibe (ZETIA) 10 MG tablet Take 1 tablet (10 mg total) by mouth daily. 90 tablet 1   fexofenadine (ALLEGRA) 180 MG tablet Take 180 mg by mouth daily.     fluocinonide gel (LIDEX) 0.05 % Apply 1 application topically 3 (three) times daily as needed.      Melatonin 10 MG TABS Take by mouth.     naproxen (NAPROSYN) 500 MG tablet TAKE 1 TABLET(500 MG) BY MOUTH DAILY AS NEEDED FOR MODERATE PAIN 30 tablet 3   Omega-3 Fatty Acids (FISH OIL) 300 MG CAPS Take by mouth. (Mega Red)     Probiotic Product (PROBIOTIC ADVANCED PO) Take by mouth.     rosuvastatin (CRESTOR) 10 MG tablet Take 1 tablet (10 mg total) by mouth daily. 90 tablet 1   TURMERIC PO Take by mouth.     No current facility-administered medications for this visit.   No results found.  Review of Systems:   A ROS was performed including pertinent positives and negatives as documented in the HPI.  Physical Exam :   Constitutional: NAD and appears stated age Neurological: Alert and oriented Psych: Appropriate affect and cooperative There were no vitals taken for this visit.   Comprehensive Musculoskeletal Exam:      Musculoskeletal Exam  Gait Normal  Alignment Normal   Right Left  Inspection Normal Normal  Palpation    Tenderness Hoffa's  fat pad Hoffa's fat pad  Crepitus None None  Effusion None none  Range of Motion  Extension -3 -3  Flexion 1351 135  Strength    Extension 5/5 5/5  Flexion 5/5 5/5  Ligament Exam     Generalized Laxity No No  Lachman Negative Negative   Pivot Shift Positive Negative  Anterior Drawer Positive Negative  Valgus at 0 Negative Negative  Valgus at 20 Negative Negative  Varus at 0 0 0  Varus at 20   0 0  Posterior Drawer at 90 0 0  Vascular/Lymphatic Exam    Edema None None  Venous Stasis Changes No No  Distal Circulation Normal Normal  Neurologic    Light Touch Sensation Intact Intact  Special Tests:      Imaging:   Xray (4 views right knee, 4 views left knee): Normal  MRI (right knee, left knee): Complete tear of the right ACL with anterior tibial translation, normal left knee.  There is signal intensity at the insertion of bilateral patella tendon  I personally reviewed and interpreted the radiographs.   Assessment:   67 y.o. male with evidence of extensor mechanism pain in the setting of Hoffa fat pad irritation.  I did describe that this is fairly consistent as he is experiencing the pain on the left just as bad as the right despite not having an ACL on the right.  I did describe that not having intact ACL would put him at risk for further irritation of the Hoffa's fat pad irritation although I do believe that this is likely the predominant cause of his pain.  At today's visit his pain is essentially completely resolved.  To this effect I will plan to see him back as needed  Plan :    -Return to clinic as needed     I personally saw and evaluated the patient, and participated in the management and treatment plan.  Huel Cote, MD Attending Physician, Orthopedic Surgery  This document was dictated using Dragon voice recognition software. A reasonable attempt at proof reading has been made to minimize errors.

## 2023-01-10 ENCOUNTER — Telehealth (INDEPENDENT_AMBULATORY_CARE_PROVIDER_SITE_OTHER): Payer: Medicare Other | Admitting: Family Medicine

## 2023-01-10 ENCOUNTER — Encounter: Payer: Self-pay | Admitting: Family Medicine

## 2023-01-10 DIAGNOSIS — J029 Acute pharyngitis, unspecified: Secondary | ICD-10-CM | POA: Diagnosis not present

## 2023-01-10 DIAGNOSIS — U071 COVID-19: Secondary | ICD-10-CM

## 2023-01-10 NOTE — Progress Notes (Signed)
MyChart Video Visit    Virtual Visit via Video Note    Patient location: Home. Patient and provider in visit Provider location: Office  I discussed the limitations of evaluation and management by telemedicine and the availability of in person appointments. The patient expressed understanding and agreed to proceed.  Visit Date: 01/10/2023  Today's healthcare provider: Hetty Blend, NP-C     Subjective:    Patient ID: Kenneth [name]., male    DOB: 1955/11/03, 67 y.o.   MRN: [id number]  Chief Complaint  Patient presents with   Covid Positive    today    HPI  Tested positive for Covid today.  Symptom onset in the past 24 hours.  Wife positive.   C/o fatigue, ST, cough.   Taking ibuprofen  No fever, chills, dizziness, headache,   He had Covid in early 2022.    Past Medical History:  Diagnosis Date   Allergy    Arthritis 2022   knees   Hx of colonic polyp 2007   Hyperlipidemia    Insomnia 06/02/2014   Seizures (HCC) 1983   Syncope    in 20s; no etiology, no recurrence. Dilantin Rxed    Past Surgical History:  Procedure Laterality Date   colonoscopy with polypectomy  2007   Dr Leone Payor, 4 mm rectal polyp   DENTAL SURGERY     bone grafts, to get implants   NASAL FRACTURE SURGERY     TONSILLECTOMY AND ADENOIDECTOMY      Family History  Problem Relation Age of Onset   Macular degeneration Father    Cancer Mother        throat; smoker   Macular degeneration Paternal Uncle    Rectal cancer Other        maternal cousin   Colon cancer Neg Hx    Esophageal cancer Neg Hx    Stomach cancer Neg Hx    Stroke Neg Hx    Heart disease Neg Hx    Diabetes Neg Hx    Prostate cancer Neg Hx     Social History   Socioeconomic History   Marital status: Married    Spouse name: Not on file   Number of children: 0   Years of education: Not on file   Highest education level: Some college, no degree  Occupational History   Occupation: part  Scientist, physiological, 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, sports  Tobacco Use   Smoking status: Never   Smokeless tobacco: Never  Substance and Sexual Activity   Alcohol use: Yes    Alcohol/week: 6.0 - 8.0 standard drinks of alcohol    Types: 6 - 8 Cans of beer per week    Comment: 2-3  beers qd   Drug use: Not Currently    Types: PCP   Sexual activity: Not Currently  Other Topics Concern   Not on file  Social History Narrative   Household pt and wife   Social Determinants of Health   Financial Resource Strain: Low Risk  (08/25/2022)   Overall Financial Resource Strain (CARDIA)    Difficulty of Paying Living Expenses: Not hard at all  Food Insecurity: No Food Insecurity (08/25/2022)   Hunger Vital Sign    Worried About Running Out of Food in the Last Year: Never true    Ran Out of Food in the Last Year: Never true  Transportation Needs: No Transportation Needs (08/25/2022)   PRAPARE - Transportation    Lack of Transportation (  Medical): No    Lack of Transportation (Non-Medical): No  Physical Activity: Insufficiently Active (08/25/2022)   Exercise Vital Sign    Days of Exercise per Week: 2 days    Minutes of Exercise per Session: 20 min  Stress: No Stress Concern Present (08/25/2022)   [employer] of Occupational Health - Occupational Stress Questionnaire    Feeling of Stress : Not at all  Social Connections: Unknown (08/25/2022)   Social Connection and Isolation Panel [NHANES]    Frequency of Communication with Friends and Family: Three times a week    Frequency of Social Gatherings with Friends and Family: More than three times a week    Attends Religious Services: Patient declined    Active Member of Clubs or Organizations: Yes    Attends Engineer, structural: More than 4 times per year    Marital Status: Married  Catering manager Violence: Not At Risk (08/26/2022)   Humiliation, Afraid, Rape, and Kick questionnaire    Fear of Current or Ex-Partner: No     Emotionally Abused: No    Physically Abused: No    Sexually Abused: No    Outpatient Medications Prior to Visit  Medication Sig Dispense Refill   acetaminophen (TYLENOL) 650 MG CR tablet Take 650 mg by mouth 2 (two) times daily as needed for pain.     ALPRAZolam (XANAX) 0.25 MG tablet TAKE 2 TABLETS(0.5 MG) BY MOUTH AT BEDTIME AS NEEDED FOR SLEEP 60 tablet 2   calcium carbonate (OS-CAL) 600 MG TABS Take 1,200 mg by mouth daily.     diclofenac Sodium (VOLTAREN) 1 % GEL Apply 2 g topically 4 (four) times daily.     DILANTIN 100 MG ER capsule TAKE 5 CAPSULES DAILY AS   DIRECTED 450 capsule 1   ezetimibe (ZETIA) 10 MG tablet Take 1 tablet (10 mg total) by mouth daily. 90 tablet 1   fexofenadine (ALLEGRA) 180 MG tablet Take 180 mg by mouth daily.     fluocinonide gel (LIDEX) 0.05 % Apply 1 application topically 3 (three) times daily as needed.      Melatonin 10 MG TABS Take by mouth.     naproxen (NAPROSYN) 500 MG tablet TAKE 1 TABLET(500 MG) BY MOUTH DAILY AS NEEDED FOR MODERATE PAIN 30 tablet 3   Omega-3 Fatty Acids (FISH OIL) 300 MG CAPS Take by mouth. (Mega Red)     Probiotic Product (PROBIOTIC ADVANCED PO) Take by mouth.     rosuvastatin (CRESTOR) 10 MG tablet Take 1 tablet (10 mg total) by mouth daily. 90 tablet 1   TURMERIC PO Take by mouth.     No facility-administered medications prior to visit.    No Known Allergies  ROS     Objective:    Physical Exam  There were no vitals taken for this visit. Wt Readings from Last 3 Encounters:  11/18/22 202 lb (91.6 kg)  10/29/22 202 lb (91.6 kg)  09/10/22 198 lb (89.8 kg)   Alert and oriented and in no acute distress.  Respirations unlabored.  Speaking in complete sentences without difficulty.    Assessment & Plan:   Problem List Items Addressed This Visit   None Visit Diagnoses     COVID-19 virus infection    -  Primary   Acute pharyngitis, unspecified etiology          Called and spoke with pharmacist, Neysa Bonito, and  Paxlovid contraindicated in patients taking Dilantin.  He is mildly symptomatic.  We discussed symptomatic management.  He will let us know if he is worsening in the next few days.  He is aware of quarantine guidelines.  I am having Kenneth [name]. "Tom" maintain his calcium carbonate, Fish Oil, fexofenadine, fluocinonide gel, TURMERIC PO, Probiotic Product (PROBIOTIC ADVANCED PO), Melatonin, diclofenac Sodium, naproxen, acetaminophen, ezetimibe, rosuvastatin, Dilantin, and ALPRAZolam.  No orders of the defined types were placed in this encounter.   I discussed the assessment and treatment plan with the patient. The patient was provided an opportunity to ask questions and all were answered. The patient agreed with the plan and demonstrated an understanding of the instructions.   The patient was advised to call back or seek an in-person evaluation if the symptoms worsen or if the condition fails to improve as anticipated.    Hetty Blend, NP-C [facility] [place] [phone] (phone) [phone] (fax)  [facility]

## 2023-01-29 ENCOUNTER — Other Ambulatory Visit: Payer: Self-pay | Admitting: Internal Medicine

## 2023-02-04 ENCOUNTER — Telehealth: Payer: Self-pay | Admitting: Internal Medicine

## 2023-02-04 NOTE — Telephone Encounter (Signed)
Requesting: alprazolam 0.25mg  Contract: 07/21/20 UDS:  07/18/2015 Last Visit:  07/26/22 Next Visit: 02/05/23 Last Refill: 09/02/22 #60 and 2RF  Will get updated UDS and contract at visit tomorrow.   Please Advise

## 2023-02-05 ENCOUNTER — Ambulatory Visit (INDEPENDENT_AMBULATORY_CARE_PROVIDER_SITE_OTHER): Payer: Medicare Other | Admitting: Internal Medicine

## 2023-02-05 ENCOUNTER — Encounter: Payer: Self-pay | Admitting: Internal Medicine

## 2023-02-05 VITALS — BP 136/68 | HR 52 | Temp 97.9°F | Resp 16 | Ht 70.0 in | Wt 199.0 lb

## 2023-02-05 DIAGNOSIS — Z0000 Encounter for general adult medical examination without abnormal findings: Secondary | ICD-10-CM | POA: Diagnosis not present

## 2023-02-05 DIAGNOSIS — D7589 Other specified diseases of blood and blood-forming organs: Secondary | ICD-10-CM

## 2023-02-05 DIAGNOSIS — Z0001 Encounter for general adult medical examination with abnormal findings: Secondary | ICD-10-CM

## 2023-02-05 DIAGNOSIS — E785 Hyperlipidemia, unspecified: Secondary | ICD-10-CM

## 2023-02-05 DIAGNOSIS — R55 Syncope and collapse: Secondary | ICD-10-CM

## 2023-02-05 DIAGNOSIS — Z09 Encounter for follow-up examination after completed treatment for conditions other than malignant neoplasm: Secondary | ICD-10-CM | POA: Insufficient documentation

## 2023-02-05 DIAGNOSIS — R7989 Other specified abnormal findings of blood chemistry: Secondary | ICD-10-CM | POA: Diagnosis not present

## 2023-02-05 DIAGNOSIS — Z23 Encounter for immunization: Secondary | ICD-10-CM | POA: Diagnosis not present

## 2023-02-05 DIAGNOSIS — G4700 Insomnia, unspecified: Secondary | ICD-10-CM | POA: Diagnosis not present

## 2023-02-05 DIAGNOSIS — Z79899 Other long term (current) drug therapy: Secondary | ICD-10-CM

## 2023-02-05 LAB — COMPREHENSIVE METABOLIC PANEL
ALT: 56 U/L — ABNORMAL HIGH (ref 0–53)
AST: 38 U/L — ABNORMAL HIGH (ref 0–37)
Albumin: 4.3 g/dL (ref 3.5–5.2)
Alkaline Phosphatase: 86 U/L (ref 39–117)
BUN: 14 mg/dL (ref 6–23)
CO2: 26 meq/L (ref 19–32)
Calcium: 9.5 mg/dL (ref 8.4–10.5)
Chloride: 100 meq/L (ref 96–112)
Creatinine, Ser: 0.83 mg/dL (ref 0.40–1.50)
GFR: 89.41 mL/min (ref >60.00)
Glucose, Bld: 109 mg/dL — ABNORMAL HIGH (ref 70–99)
Potassium: 4.3 meq/L (ref 3.5–5.1)
Sodium: 134 meq/L — ABNORMAL LOW (ref 135–145)
Total Bilirubin: 0.6 mg/dL (ref 0.2–1.2)
Total Protein: 6.4 g/dL (ref 6.0–8.3)

## 2023-02-05 LAB — CBC WITH DIFFERENTIAL/PLATELET
Basophils Absolute: 0 10*3/uL (ref 0.0–0.1)
Basophils Relative: 0.8 % (ref 0.0–3.0)
Eosinophils Absolute: 0.1 10*3/uL (ref 0.0–0.7)
Eosinophils Relative: 1.8 % (ref 0.0–5.0)
HCT: 47.3 % (ref 39.0–52.0)
Hemoglobin: 15.7 g/dL (ref 13.0–17.0)
Lymphocytes Relative: 17.6 % (ref 12.0–46.0)
Lymphs Abs: 0.9 10*3/uL (ref 0.7–4.0)
MCHC: 33.2 g/dL (ref 30.0–36.0)
MCV: 102.4 fL — ABNORMAL HIGH (ref 78.0–100.0)
Monocytes Absolute: 0.7 10*3/uL (ref 0.1–1.0)
Monocytes Relative: 13.5 % — ABNORMAL HIGH (ref 3.0–12.0)
Neutro Abs: 3.4 10*3/uL (ref 1.4–7.7)
Neutrophils Relative %: 66.3 % (ref 43.0–77.0)
Platelets: 292 10*3/uL (ref 150.0–400.0)
RBC: 4.62 Mil/uL (ref 4.22–5.81)
RDW: 12.9 % (ref 11.5–15.5)
WBC: 5.2 10*3/uL (ref 4.0–10.5)

## 2023-02-05 LAB — B12 AND FOLATE PANEL
Folate: 19.5 ng/mL (ref >5.9)
Vitamin B-12: 485 pg/mL (ref 211–911)

## 2023-02-05 LAB — LIPID PANEL
Cholesterol: 183 mg/dL (ref 0–200)
HDL: 75.6 mg/dL (ref >39.00)
LDL Cholesterol: 91 mg/dL (ref 0–99)
NonHDL: 107.27
Total CHOL/HDL Ratio: 2
Triglycerides: 81 mg/dL (ref 0.0–149.0)
VLDL: 16.2 mg/dL (ref 0.0–40.0)

## 2023-02-05 LAB — PSA: PSA: 1.05 ng/mL (ref 0.10–4.00)

## 2023-02-05 NOTE — Assessment & Plan Note (Signed)
Here for CPX  High cholesterol: On Zetia and rosuvastatin, checking labs. Insomnia, PDMP okay, Xanax prescription sent.  Check UDS Chronic Dilantin: Check levels. Increased LFTs, rechecking today, of note, he tells me he is drinking less than before. Increased MCV: B12 normal before, check folic acid and thiamine.  Increased MCV could be due to EtOH or Dilantin. Knee pain: Under the care of Ortho, improving.  On naproxen, GI precautions discussed. COVID, recently dx, better but still tired and having cough.  O2 sat normal, lung exam benign.  Anticipate he will be better in the next few weeks. RTC 6 months

## 2023-02-05 NOTE — Telephone Encounter (Signed)
PDMP okay Rx sent ?

## 2023-02-05 NOTE — Assessment & Plan Note (Signed)
Here for CPX - Td  06-2016 - prevnar:  11/19/2018; pnm 23: 2021 - s/p shingrex - rec : Covid vax in 2-3 weeks,  RSV   -  flu shot  today -CCS: Last colonoscopy 2013, neg , cscope 03/2022, next per GI - Prostate cancer screening: No sxs,  check PSA.   -labs: CMP, FLP, CBC, Dilantin level, thyroid, B12,folic acid, B1, PSA -Diet and exercise: States he is doing okay, trying to stay more active and eats healthy.  -Healthcare POA: See AVS

## 2023-02-05 NOTE — Progress Notes (Signed)
Subjective:    Patient ID: Kenneth Shape., male    DOB: [birth year], 67 y.o.   MRN: [id number]  DOS:  02/05/2023 Type of visit - description: CPX  Here for CPX. Chronic medical problems addressed. Dx with COVID about 3 weeks ago, no antivirals, has some residual symptoms include mild cough without chest congestion and fatigue in the afternoon.  No fever chills No chest pain no difficulty breathing.  Denies GI or GU issues.  Review of Systems  Other than above, a 14 point review of systems is negative    Past Medical History:  Diagnosis Date   Allergy    Arthritis 2022   knees   Hx of colonic polyp 2007   Hyperlipidemia    Insomnia 06/02/2014   Seizures (HCC) 1983   Syncope    in 20s; no etiology, no recurrence. 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, sports  Tobacco Use   Smoking status: Never   Smokeless tobacco: Never  Substance and Sexual Activity   Alcohol use: Yes    Alcohol/week: 6.0 - 8.0 standard drinks of alcohol    Types: 6 - 8 Cans of beer per week    Comment: drinks less , qod, 2 beers   Drug use: Not Currently    Types: PCP   Sexual activity: Not Currently  Other Topics Concern   Not on file  Social History Narrative   Household pt and wife   Social Determinants of Health   Financial Resource Strain: Low Risk  (08/25/2022)   Overall Financial Resource Strain (CARDIA)    Difficulty of Paying Living Expenses: Not hard at  all  Food Insecurity: No Food Insecurity (08/25/2022)   Hunger Vital Sign    Worried About Running Out of Food in the Last Year: Never true    Ran Out of Food in the Last Year: Never true  Transportation Needs: No Transportation Needs (08/25/2022)   PRAPARE - Administrator, Civil Service (Medical): No    Lack of Transportation (Non-Medical): No  Physical Activity: Insufficiently Active (08/25/2022)   Exercise Vital Sign    Days of Exercise per Week: 2 days    Minutes of Exercise per Session: 20 min  Stress: No Stress Concern Present (08/25/2022)   [employer] of Occupational Health - Occupational Stress Questionnaire    Feeling of Stress : Not at all  Social Connections: Unknown (08/25/2022)   Social Connection and Isolation Panel [NHANES]    Frequency of Communication with Friends and Family: Three times a week    Frequency of Social Gatherings with Friends and Family: More than three times a week    Attends Religious Services: Patient declined    Active Member of Clubs or Organizations: Yes    Attends Banker Meetings: More than 4 times per year    Marital Status:  Married  Intimate Partner Violence: Not At Risk (08/26/2022)   Humiliation, Afraid, Rape, and Kick questionnaire    Fear of Current or Ex-Partner: No    Emotionally Abused: No    Physically Abused: No    Sexually Abused: No    Current Outpatient Medications  Medication Instructions   acetaminophen (TYLENOL) 650 mg, Oral, 2 times daily PRN   ALPRAZolam (XANAX) 0.25 MG tablet TAKE 2 TABLETS(0.5 MG) BY MOUTH AT BEDTIME AS NEEDED FOR SLEEP   calcium carbonate (OS-CAL) 1,200 mg, Oral, Daily   Coenzyme Q10-Fish Oil-Vit E (CO-Q 10 OMEGA-3 FISH OIL PO) Oral   DILANTIN 100 MG ER capsule TAKE 5 CAPSULES DAILY AS   DIRECTED   ezetimibe (ZETIA) 10 mg, Oral, Daily   fexofenadine (ALLEGRA) 180 mg, Daily   fluocinonide gel (LIDEX) 0.05 % 1 application , Topical, 3 times daily PRN   Melatonin 10 MG TABS Oral    MILK THISTLE PO    MULTIPLE VITAMINS PO Multiple Vitamin   naproxen (NAPROSYN) 500 MG tablet TAKE 1 TABLET(500 MG) BY MOUTH DAILY AS NEEDED FOR MODERATE PAIN   Probiotic Product (PROBIOTIC ADVANCED PO) Oral   rosuvastatin (CRESTOR) 10 mg, Oral, Daily   TURMERIC PO Oral       Objective:   Physical Exam BP 136/68   Pulse (!) 52   Temp 97.9 F (36.6 C) (Oral)   Resp 16   Ht 5\' 10"  (1.778 m)   Wt 199 lb (90.3 kg)   SpO2 97%   BMI 28.55 kg/m  General: Well developed, NAD, BMI noted Neck: No  thyromegaly  HEENT:  Normocephalic . Face symmetric, atraumatic Lungs:  CTA B Normal respiratory effort, no intercostal retractions, no accessory muscle use. Heart: RRR,  no murmur.  Abdomen:  Not distended, soft, non-tender. No rebound or rigidity.   Lower extremities: no pretibial edema bilaterally  Skin: Exposed areas without rash. Not pale. Not jaundice Neurologic:  alert & oriented X3.  Speech normal, gait appropriate for age and unassisted Strength symmetric and appropriate for age.  Psych: Cognition and judgment appear intact.  Cooperative with normal attention span and concentration.  Behavior appropriate. No anxious or depressed appearing.     Assessment    Assessment Hyperlipidemia Insomnia-- xanax prn, rx per pcp Allergies Syncope, in the 1980s, on Dilantin since then, used to see Dr. Sandria Manly, reluctant to stop dilantin Elevated LFTs:  Chronic, on-off x years  --  hepatitis B C (-) 2008, iron, ferritin, alpha-1 antitrypsin normal 2016.  --CT 4-/2018: Hepatic steatosis.   --SPEP 2019 (-).   --GI visit 02-2022: Multiple labs negative, likely multifactorial: EtOH, fatty liver.  Rec to f/u w/ PCP Incomplete RBBB-previously a stress test was negative Osteopenia?   DEXA 07/18/2016 normal    PLAN: Here for CPX - Td  06-2016 - prevnar:  11/19/2018; pnm 23: 2021 - s/p shingrex - rec : Covid vax in 2-3 weeks,  RSV   -  flu shot  today -CCS: Last colonoscopy 2013, neg ,  cscope 03/2022, next per GI - Prostate cancer screening: No sxs,  check PSA.   -labs: CMP, FLP, CBC, Dilantin level, thyroid, B12,folic acid, B1, PSA -Diet and exercise: States he is doing okay, trying to stay more active and eats healthy.  -Healthcare POA: See AVS High cholesterol: On Zetia and rosuvastatin, checking labs. Insomnia, PDMP okay, Xanax prescription sent.  Check UDS Chronic Dilantin: Check levels. Increased LFTs, rechecking today, of note, he tells me he is drinking less  than before. Increased MCV: B12 normal before, check folic acid and thiamine.  Increased MCV could be due to EtOH or Dilantin. Knee pain: Under the care of Ortho, improving.  On naproxen, GI precautions discussed. COVID, recently dx, better but still tired and having cough.  O2 sat normal, lung exam benign.  Anticipate he will be better in the next few weeks. RTC 6 months

## 2023-02-05 NOTE — Patient Instructions (Addendum)
Vaccines I recommend:  Covid booster in 2 to 3 weeks RSV vaccine, later on     GO TO THE LAB : Get the blood work     Next visit with me in 6 months     Please schedule it at the front desk     "Health Care Power of attorney" ,  "Living will" (Advance care planning documents)  If you already have a living will or healthcare power of attorney, is recommended you bring the copy to be scanned in your chart.   The document will be available to all the doctors you see in the system.  Advance care planning is a process that supports adults in  understanding and sharing their preferences regarding future medical care.  The patient's preferences are recorded in documents called Advance Directives and the can be modified at any time while the patient is in full mental capacity.   If you don't have one, please consider create one.      More information at: [email or web address]

## 2023-02-06 LAB — PHENYTOIN LEVEL, TOTAL: Phenytoin (Dilantin), Serum: 15.9 ug/mL (ref 10.0–20.0)

## 2023-02-10 LAB — DRUG MONITORING PANEL 375977 , URINE
Alphahydroxyalprazolam: 42 ng/mL — ABNORMAL HIGH (ref <25)
Alphahydroxymidazolam: NEGATIVE ng/mL (ref <50)
Alphahydroxytriazolam: NEGATIVE ng/mL (ref <50)
Aminoclonazepam: NEGATIVE ng/mL (ref <25)
Amphetamines: NEGATIVE ng/mL (ref <500)
Barbiturates: NEGATIVE ng/mL (ref <300)
Benzodiazepines: POSITIVE ng/mL — AB (ref <100)
Cocaine Metabolite: NEGATIVE ng/mL (ref <150)
Desmethyltramadol: NEGATIVE ng/mL (ref <100)
Ethyl Sulfate (ETS): 2033 ng/mL — ABNORMAL HIGH (ref <100)
Hydroxyethylflurazepam: NEGATIVE ng/mL (ref <50)
Lorazepam: NEGATIVE ng/mL (ref <50)
Marijuana Metabolite: NEGATIVE ng/mL (ref <20)
Nordiazepam: NEGATIVE ng/mL (ref <50)
Opiates: NEGATIVE ng/mL (ref <100)
Oxazepam: NEGATIVE ng/mL (ref <50)
Oxycodone: NEGATIVE ng/mL (ref <100)
Temazepam: NEGATIVE ng/mL (ref <50)
Tramadol Comments: 21410 ng/mL — ABNORMAL HIGH (ref <500)
Tramadol: POSITIVE ng/mL — AB (ref <500)
Tramadol: NEGATIVE ng/mL (ref <100)
medMATCH Summary: NEGATIVE ng/mL (ref <100)

## 2023-02-10 LAB — VITAMIN B1

## 2023-02-10 LAB — DM TEMPLATE

## 2023-02-11 ENCOUNTER — Telehealth: Payer: Self-pay | Admitting: *Deleted

## 2023-02-11 NOTE — Telephone Encounter (Signed)
Received message from [facility] that no suitable specimen was received for the B1 test ordered on 10/16. Specimen will need to be recollected.  Left message for pt to return my call.

## 2023-02-12 ENCOUNTER — Encounter: Payer: Self-pay | Admitting: Internal Medicine

## 2023-02-12 NOTE — Telephone Encounter (Signed)
Notified pt. He voices understanding and is agreeable to return for redraw.  Lab appt scheduled for Tomorrow at 2:30pm.

## 2023-02-13 ENCOUNTER — Other Ambulatory Visit: Payer: Federal, State, Local not specified - PPO

## 2023-02-13 DIAGNOSIS — D7589 Other specified diseases of blood and blood-forming organs: Secondary | ICD-10-CM

## 2023-02-17 LAB — VITAMIN B1: Vitamin B1 (Thiamine): 22 nmol/L (ref 8–30)

## 2023-02-28 ENCOUNTER — Other Ambulatory Visit: Payer: Self-pay | Admitting: Internal Medicine

## 2023-04-22 ENCOUNTER — Ambulatory Visit (INDEPENDENT_AMBULATORY_CARE_PROVIDER_SITE_OTHER): Payer: Medicare Other | Admitting: Student

## 2023-04-22 ENCOUNTER — Encounter (HOSPITAL_BASED_OUTPATIENT_CLINIC_OR_DEPARTMENT_OTHER): Payer: Self-pay | Admitting: Student

## 2023-04-22 DIAGNOSIS — M25561 Pain in right knee: Secondary | ICD-10-CM | POA: Diagnosis not present

## 2023-04-22 DIAGNOSIS — M25562 Pain in left knee: Secondary | ICD-10-CM | POA: Diagnosis not present

## 2023-04-22 DIAGNOSIS — G8929 Other chronic pain: Secondary | ICD-10-CM | POA: Diagnosis not present

## 2023-04-22 DIAGNOSIS — E8889 Other specified metabolic disorders: Secondary | ICD-10-CM | POA: Diagnosis not present

## 2023-04-22 NOTE — Progress Notes (Signed)
 Chief Complaint: Bilateral knee pain     History of Present Illness:   04/22/2023: Patient presents today for her follow-up evaluation of bilateral knee pain.  He was seen by Dr. Genelle in August and received bilateral cortisone injections of the Hoffa pad.  States that this gave him great relief compared to previous intra-articular injections.  Pain began to return about 2 weeks ago with no recent injury.  Describes this as an ache and rates at an 8/10.  Pain is worse at night.  Has been taking Advil  and icing.   Kenneth [name]. is a 67 y.o. male presents today with bilateral knee pain that has been ongoing now for many years.  He has been seeing Dr. Leonce as well as Dr. Vernetta.  He did have MRIs obtained of bilateral knees which did show a chronic appearing ACL tear on the right knee.  With regard to his pain he is experiencing this deep in the joint.  He is not able to kneel on either knee or work on his classic car.  He has been using NSAIDs as well as compression with limited relief.  The pain is predominantly central.  Denies any instability or feeling like the right knee is giving out.  He has previously had injections as well as hyaluronic acid without significant relief.  He is here today as referral from my partner Dr. Vernetta.    Surgical History:   None  PMH/PSH/Family History/Social History/Meds/Allergies:    Past Medical History:  Diagnosis Date   Allergy    Arthritis 2022   knees   Hx of colonic polyp 2007   Hyperlipidemia    Insomnia 06/02/2014   Seizures (HCC) 1983   Syncope    in 20s; no etiology, no recurrence. Dilantin  Rxed   Past Surgical History:  Procedure Laterality Date   colonoscopy with polypectomy  2007   Dr Avram, 4 mm rectal polyp   DENTAL SURGERY     bone grafts, to get implants   NASAL FRACTURE SURGERY     TONSILLECTOMY AND ADENOIDECTOMY     Social History   Socioeconomic History   Marital  status: Married    Spouse name: Not on file   Number of children: 0   Years of education: Not on file   Highest education level: Some college, no degree  Occupational History   Occupation: part scientist, physiological, 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, sports  Tobacco Use   Smoking status: Never   Smokeless tobacco: Never  Substance and Sexual Activity   Alcohol use: Yes    Alcohol/week: 6.0 - 8.0 standard drinks of alcohol    Types: 6 - 8 Cans of beer per week    Comment: drinks less , qod, 2 beers   Drug use: Not Currently    Types: PCP   Sexual activity: Not Currently  Other Topics Concern   Not on file  Social History Narrative   Household pt and wife   Social Drivers of Corporate Investment Banker Strain: Low Risk  (08/25/2022)   Overall Financial Resource Strain (CARDIA)    Difficulty of Paying Living Expenses: Not hard at all  Food Insecurity: No Food Insecurity (08/25/2022)   Hunger Vital Sign    Worried About Programme Researcher, Broadcasting/film/video in  the Last Year: Never true    Ran Out of Food in the Last Year: Never true  Transportation Needs: No Transportation Needs (08/25/2022)   PRAPARE - Administrator, Civil Service (Medical): No    Lack of Transportation (Non-Medical): No  Physical Activity: Insufficiently Active (08/25/2022)   Exercise Vital Sign    Days of Exercise per Week: 2 days    Minutes of Exercise per Session: 20 min  Stress: No Stress Concern Present (08/25/2022)   [employer] of Occupational Health - Occupational Stress Questionnaire    Feeling of Stress : Not at all  Social Connections: Unknown (08/25/2022)   Social Connection and Isolation Panel [NHANES]    Frequency of Communication with Friends and Family: Three times a week    Frequency of Social Gatherings with Friends and Family: More than three times a week    Attends Religious Services: Patient declined    Database Administrator or Organizations: Yes    Attends Hospital Doctor: More than 4 times per year    Marital Status: Married   Family History  Problem Relation Age of Onset   Macular degeneration Father    Cancer Mother        throat; [name]   Macular degeneration Paternal Uncle    Rectal cancer Other        maternal cousin   Colon cancer Neg Hx    Esophageal cancer Neg Hx    Stomach cancer Neg Hx    Stroke Neg Hx    Heart disease Neg Hx    Diabetes Neg Hx    Prostate cancer Neg Hx    No Known Allergies Current Outpatient Medications  Medication Sig Dispense Refill   acetaminophen  (TYLENOL ) 650 MG CR tablet Take 650 mg by mouth 2 (two) times daily as needed for pain.     ALPRAZolam  (XANAX ) 0.25 MG tablet TAKE 2 TABLETS(0.5 MG) BY MOUTH AT BEDTIME AS NEEDED FOR SLEEP 60 tablet 3   calcium  carbonate (OS-CAL) 600 MG TABS Take 1,200 mg by mouth daily.     Coenzyme Q10-Fish Oil-Vit E (CO-Q 10 OMEGA-3 FISH OIL PO) Take by mouth.     DILANTIN  100 MG ER capsule TAKE 5 CAPSULES DAILY AS   DIRECTED 450 capsule 1   ezetimibe  (ZETIA ) 10 MG tablet Take 1 tablet (10 mg total) by mouth daily. 90 tablet 1   fexofenadine (ALLEGRA) 180 MG tablet Take 180 mg by mouth daily.     fluocinonide gel (LIDEX) 0.05 % Apply 1 application topically 3 (three) times daily as needed.      Melatonin 10 MG TABS Take by mouth.     MILK THISTLE PO      MULTIPLE VITAMINS PO Multiple Vitamin     naproxen  (NAPROSYN ) 500 MG tablet TAKE 1 TABLET(500 MG) BY MOUTH DAILY AS NEEDED FOR MODERATE PAIN 30 tablet 3   Probiotic Product (PROBIOTIC ADVANCED PO) Take by mouth.     rosuvastatin  (CRESTOR ) 10 MG tablet Take 1 tablet (10 mg total) by mouth daily. 90 tablet 1   TURMERIC PO Take by mouth.     No current facility-administered medications for this visit.   No results found.  Review of Systems:   A ROS was performed including pertinent positives and negatives as documented in the HPI.  Physical Exam :   Constitutional: NAD and appears stated age Neurological: Alert and  oriented Psych: Appropriate affect and cooperative There were no vitals taken for  this visit.   Comprehensive Musculoskeletal Exam:    Bilateral knee exam demonstrates no evidence of obvious deformity, erythema, or ecchymosis.  Active range of motion from 0 to 120 degrees bilaterally.  Tenderness along bilateral joint lines adjacent to the patellar tendon.  No instability with varus or valgus stress.  Positive Lachman test of the right knee.  Imaging:    Assessment:   67 y.o. 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  Previous Hoffa pad injections almost 5 months ago gave him almost complete relief.  Has history of chronic ACL tear in the right knee but states that both knees are equally as bothersome.  Given previous relief, I did offer to repeat injections today.  Patient is agreeable and bilateral Hoffa pad injections were performed under ultrasound guidance and he tolerated this well.  Will plan to have him return to clinic as needed and continue following with Dr. Genelle for further management.  Plan :    -Bilateral knee Hoffa pad injections performed today -Return to clinic as needed     Procedure Note  Patient: Kenneth [name].             Date of Birth: [birth year]           MRN: [id number]             Visit Date: 04/22/2023  Procedures: Visit Diagnoses:  1. Hoffa's fat pad disease (HCC)     Large Joint Inj: bilateral knee on 04/22/2023 4:02 PM Indications: pain Details: 22 G 1.5 in needle, ultrasound-guided anterolateral approach Medications (Right): 4 mL lidocaine  1 %; 2 mL triamcinolone  acetonide 40 MG/ML Medications (Left): 4 mL lidocaine  1 %; 2 mL triamcinolone  acetonide 40 MG/ML Outcome: tolerated well, no immediate complications Procedure, treatment alternatives, risks and benefits explained, specific risks discussed. Consent was given by the patient. Immediately prior to procedure a time out was called to verify the correct patient,  procedure, equipment, support staff and site/side marked as required. Patient was prepped and draped in the usual sterile fashion.      I personally saw and evaluated the patient, and participated in the management and treatment plan.   Leonce Reveal, PA-C Orthopedics

## 2023-04-23 MED ORDER — LIDOCAINE HCL 1 % IJ SOLN
4.0000 mL | INTRAMUSCULAR | Status: AC | PRN
Start: 2023-04-22 — End: 2023-04-22
  Administered 2023-04-22: 4 mL

## 2023-04-23 MED ORDER — TRIAMCINOLONE ACETONIDE 40 MG/ML IJ SUSP
2.0000 mL | INTRAMUSCULAR | Status: AC | PRN
Start: 2023-04-22 — End: 2023-04-22
  Administered 2023-04-22: 2 mL via INTRA_ARTICULAR

## 2023-04-29 ENCOUNTER — Encounter (HOSPITAL_BASED_OUTPATIENT_CLINIC_OR_DEPARTMENT_OTHER): Payer: Self-pay

## 2023-04-30 ENCOUNTER — Ambulatory Visit (HOSPITAL_BASED_OUTPATIENT_CLINIC_OR_DEPARTMENT_OTHER): Payer: Medicare Other | Admitting: Orthopaedic Surgery

## 2023-06-13 ENCOUNTER — Other Ambulatory Visit: Payer: Self-pay | Admitting: Physician Assistant

## 2023-06-13 ENCOUNTER — Other Ambulatory Visit: Payer: Self-pay

## 2023-06-13 ENCOUNTER — Ambulatory Visit (INDEPENDENT_AMBULATORY_CARE_PROVIDER_SITE_OTHER): Payer: Federal, State, Local not specified - PPO | Admitting: Radiology

## 2023-06-13 ENCOUNTER — Ambulatory Visit
Admission: RE | Admit: 2023-06-13 | Discharge: 2023-06-13 | Disposition: A | Discharge status: Home / Self Care | Payer: Federal, State, Local not specified - PPO | Source: Ambulatory Visit | Attending: Family Medicine | Admitting: Family Medicine

## 2023-06-13 VITALS — BP 153/76 | HR 79 | Temp 97.9°F | Resp 20 | Ht 70.0 in | Wt 200.0 lb

## 2023-06-13 DIAGNOSIS — R051 Acute cough: Secondary | ICD-10-CM

## 2023-06-13 DIAGNOSIS — J209 Acute bronchitis, unspecified: Secondary | ICD-10-CM | POA: Diagnosis not present

## 2023-06-13 LAB — POCT INFLUENZA A/B
Influenza A, POC: NEGATIVE
Influenza B, POC: NEGATIVE

## 2023-06-13 MED ORDER — AZITHROMYCIN 250 MG PO TABS: 250.0000 mg | ORAL_TABLET | Freq: Every day | ORAL | 0 refills | Status: DC

## 2023-06-13 MED ORDER — PREDNISONE 20 MG PO TABS: 40.0000 mg | ORAL_TABLET | Freq: Every day | ORAL | 0 refills | Status: AC

## 2023-06-13 MED ORDER — IPRATROPIUM-ALBUTEROL 0.5-2.5 (3) MG/3ML IN SOLN
3.0000 mL | Freq: Once | RESPIRATORY_TRACT | Status: AC
  Administered 2023-06-13: 3 mL via RESPIRATORY_TRACT

## 2023-06-13 MED ORDER — AMOXICILLIN 500 MG PO CAPS: 1000.0000 mg | ORAL_CAPSULE | Freq: Two times a day (BID) | ORAL | 0 refills | Status: AC

## 2023-06-13 MED ORDER — ALBUTEROL SULFATE HFA 108 (90 BASE) MCG/ACT IN AERS: 2.0000 | INHALATION_SPRAY | Freq: Four times a day (QID) | RESPIRATORY_TRACT | 0 refills | Status: DC | PRN

## 2023-06-13 MED ORDER — PROMETHAZINE-DM 6.25-15 MG/5ML PO SYRP: 5.0000 mL | ORAL_SOLUTION | Freq: Four times a day (QID) | ORAL | 0 refills | Status: AC | PRN

## 2023-06-13 NOTE — Discharge Instructions (Signed)
The x-ray reading we discussed is preliminary. Your x-ray will be read by a radiologist in next few hours. If there is a discrepancy, you will be contacted, and instructed on a new plan for you care.

## 2023-06-13 NOTE — ED Provider Notes (Signed)
[name]    CSN: [id number] Arrival date & time: 06/13/23  1331      History   Chief Complaint Chief Complaint  Patient presents with   Cough    Entered by patient    HPI Kenneth [name]. is a 68 y.o. male.    Cough Cough with copious sputum and chest congestion onset 3 days ago.  States he just returned 5 days ago from a trip to [place], took a home COVID test 2 days ago which was negative.  Admits fatigue due to lack of sleep.  Admits sore throat.  Admits wheezing soreness in ribs and abdomen secondary to cough.  Denies headache, shortness of breath, abdominal pain, nausea, vomiting, diarrhea.  Denies travel companions with illness..  Denies history of COPD, bronchitis asthma or pneumonia.  He is not a smoker.  Admits wheezing in the past with respiratory illnesses.  Taking over-the-counter cough medicine without relief. Extremity pain or swelling  Past Medical History:  Diagnosis Date   Allergy    Arthritis 2022   knees   Hx of colonic polyp 2007   Hyperlipidemia    Insomnia 06/02/2014   Seizures (HCC) 1983   Syncope    in 20s; no etiology, no recurrence. Dilantin Rxed    Patient Active Problem List   Diagnosis Date Noted   PCP NOTES >>>>>>> 02/05/2023   Fatty liver 03/07/2022   Alcohol use 03/07/2022   Hyperglycemia 08/21/2018   Annual physical exam 07/19/2015   DJD (degenerative joint disease) 11/30/2014   Colon cancer screening 06/02/2014   Insomnia 06/02/2014   Nonspecific abnormal electrocardiogram (ECG) (EKG) 01/24/2012   Syncope  ---> remote, on Dilantin since the 80s. 12/27/2010   VITAMIN D DEFICIENCY 07/05/2009   Elevated LFTs 07/05/2009   History of colonic polyps 06/28/2008   Osteopenia 03/11/2007   Dyslipidemia 09/25/2006   ALLERGIC RHINITIS 09/25/2006    Past Surgical History:  Procedure Laterality Date   colonoscopy with polypectomy  2007   Dr Leone Payor, 4 mm rectal polyp   DENTAL SURGERY     bone grafts, to get  implants   NASAL FRACTURE SURGERY     TONSILLECTOMY AND ADENOIDECTOMY         Home Medications    Prior to Admission medications   Medication Sig Start Date End Date Taking? Authorizing Provider  acetaminophen (TYLENOL) 650 MG CR tablet Take 650 mg by mouth 2 (two) times daily as needed for pain.    [provider]  ALPRAZolam Prudy Feeler) 0.25 MG tablet TAKE 2 TABLETS(0.5 MG) BY MOUTH AT BEDTIME AS NEEDED FOR SLEEP 02/05/23   Wanda Plump, MD  calcium carbonate (OS-CAL) 600 MG TABS Take 1,200 mg by mouth daily.    [provider]  Coenzyme Q10-Fish Oil-Vit E (CO-Q 10 OMEGA-3 FISH OIL PO) Take by mouth.    [provider]  DILANTIN 100 MG ER capsule TAKE 5 CAPSULES DAILY AS   DIRECTED 03/03/23   Wanda Plump, MD  ezetimibe (ZETIA) 10 MG tablet Take 1 tablet (10 mg total) by mouth daily. 01/29/23   Wanda Plump, MD  fexofenadine (ALLEGRA) 180 MG tablet Take 180 mg by mouth daily.    [provider]  fluocinonide gel (LIDEX) 0.05 % Apply 1 application topically 3 (three) times daily as needed.  05/17/13   [provider]  Melatonin 10 MG TABS Take by mouth.    [provider]  MILK THISTLE PO  04/22/22  [provider]  MULTIPLE VITAMINS PO Multiple Vitamin    [provider]  naproxen (NAPROSYN) 500 MG tablet TAKE 1 TABLET(500 MG) BY MOUTH DAILY AS NEEDED FOR MODERATE PAIN 03/29/21   Wanda Plump, MD  Probiotic Product (PROBIOTIC ADVANCED PO) Take by mouth.    [provider]  rosuvastatin (CRESTOR) 10 MG tablet Take 1 tablet (10 mg total) by mouth daily. 01/29/23   Wanda Plump, MD  TURMERIC PO Take by mouth.    [provider]    Family History Family History  Problem Relation Age of Onset   Macular degeneration Father    Cancer Mother        throat; smoker   Macular degeneration Paternal Uncle    Rectal cancer Other        maternal cousin   Colon cancer Neg Hx    Esophageal cancer Neg Hx    Stomach  cancer Neg Hx    Stroke Neg Hx    Heart disease Neg Hx    Diabetes Neg Hx    Prostate cancer Neg Hx     Social History Social History   Tobacco Use   Smoking status: Never   Smokeless tobacco: Never  Substance Use Topics   Alcohol use: Yes    Alcohol/week: 6.0 - 8.0 standard drinks of alcohol    Types: 6 - 8 Cans of beer per week    Comment: drinks less , qod, 2 beers   Drug use: Not Currently    Types: PCP     Allergies   Patient has no known allergies.   Review of Systems Review of Systems  Respiratory:  Positive for cough.      Physical Exam Triage Vital Signs ED Triage Vitals [06/13/23 1340]  Encounter Vitals Group     BP (!) 153/76     Systolic BP Percentile      Diastolic BP Percentile      Pulse Rate 79     Resp 20     Temp 97.9 F (36.6 C)     Temp Source Oral     SpO2 95 %     Weight      Height      Head Circumference      Peak Flow      Pain Score      Pain Loc      Pain Education      Exclude from Growth Chart    No data found.  Updated Vital Signs BP (!) 153/76 (BP Location: Right Arm)   Pulse 79   Temp 97.9 F (36.6 C) (Oral)   Resp 20   SpO2 95%   Visual Acuity Right Eye Distance:   Left Eye Distance:   Bilateral Distance:    Right Eye Near:   Left Eye Near:    Bilateral Near:     Physical Exam Vitals and nursing note reviewed.  Constitutional:      Appearance: He is not ill-appearing or toxic-appearing.  HENT:     Head: Normocephalic and atraumatic.     Right Ear: Tympanic membrane and ear canal normal.     Left Ear: Tympanic membrane and ear canal normal.     Nose: No rhinorrhea.     Mouth/Throat:     Mouth: Mucous membranes are moist.     Pharynx: Oropharynx is clear. No oropharyngeal exudate or posterior oropharyngeal erythema.  Eyes:     Conjunctiva/sclera: Conjunctivae normal.  Cardiovascular:  Rate and Rhythm: Normal rate and regular rhythm.     Heart sounds: Normal heart sounds.  Pulmonary:      Effort: Pulmonary effort is normal.     Breath sounds: Wheezing (Scattered end expiratory wheezing) present. No rhonchi.  Musculoskeletal:     Cervical back: Neck supple.  Skin:    General: Skin is warm and dry.  Neurological:     Mental Status: He is alert and oriented to person, place, and time.     UC Treatments / Results  Labs (all labs ordered are listed, but only abnormal results are displayed) Labs Reviewed  POCT INFLUENZA A/B    EKG   Radiology No results found.  Procedures Procedures (including critical care time)  Medications Ordered in UC Medications  ipratropium-albuterol (DUONEB) 0.5-2.5 (3) MG/3ML nebulizer solution 3 mL (has no administration in time range)    Initial Impression / Assessment and Plan / UC Course  I have reviewed the triage vital signs and the nursing notes.  Pertinent labs & imaging results that were available during my care of the patient were reviewed by me and considered in my medical decision making (see chart for details).     68 year old male with productive cough for 3 days, point-of-care home COVID test was negative, has had some fatigue, nasal congestion, rhinorrhea and sore throat.  Denies known contacts with illness although has had recent travel to [place].  Well-appearing but coughing frequently and has mild diffused wheezing on exam.  He was giving a DuoNeb and his breath sounds improved and wheezing decreased.  Dates he does not feel any better.  Care flu is negative.  Will obtain chest x-ray  Chest x-ray independently viewed by me NAD Will contact patient if there is a radiologic discrepancy Rx prednisone albuterol and Promethazine DM sent to pharmacy Home care and follow-up reviewed with patient  Final Clinical Impressions(s) / UC Diagnoses   Final diagnoses:  Acute cough   Discharge Instructions   None    ED Prescriptions   None    PDMP not reviewed this encounter.   Meliton Rattan, [place] 06/13/23 1452

## 2023-06-13 NOTE — ED Triage Notes (Addendum)
Pt presents with complaints of cough, nasal congestion, and sore throat x 4 days. Pt states he went to [place] with his wife, returned 6 days ago. Pt currently rates his overall pain an 8/10, pain worsens when lying down at night. OTC Mucinex and Ibuprofen taken with no relief.

## 2023-06-20 ENCOUNTER — Encounter: Payer: Self-pay | Admitting: Family

## 2023-06-20 ENCOUNTER — Ambulatory Visit: Payer: Medicare Other | Admitting: Family

## 2023-06-20 VITALS — BP 140/82 | HR 60 | Ht 70.0 in | Wt 203.0 lb

## 2023-06-20 DIAGNOSIS — J180 Bronchopneumonia, unspecified organism: Secondary | ICD-10-CM

## 2023-06-20 MED ORDER — PREDNISONE 20 MG PO TABS: ORAL_TABLET | ORAL | 0 refills | Status: DC

## 2023-06-20 NOTE — Progress Notes (Signed)
 Kenneth [name]. is a 68 y.o. male with the following history as recorded in [employer]:  Patient Active Problem List   Diagnosis Date Noted   PCP NOTES >>>>>>> 02/05/2023   Fatty liver 03/07/2022   Alcohol use 03/07/2022   Hyperglycemia 08/21/2018   Annual physical exam 07/19/2015   DJD (degenerative joint disease) 11/30/2014   Colon cancer screening 06/02/2014   Insomnia 06/02/2014   Nonspecific abnormal electrocardiogram (ECG) (EKG) 01/24/2012   Syncope  ---> remote, on Dilantin since the 80s. 12/27/2010   VITAMIN D DEFICIENCY 07/05/2009   Elevated LFTs 07/05/2009   History of colonic polyps 06/28/2008   Osteopenia 03/11/2007   Dyslipidemia 09/25/2006   ALLERGIC RHINITIS 09/25/2006    Current Outpatient Medications  Medication Sig Dispense Refill   acetaminophen (TYLENOL) 650 MG CR tablet Take 650 mg by mouth 2 (two) times daily as needed for pain.     albuterol (VENTOLIN HFA) 108 (90 Base) MCG/ACT inhaler Inhale 2 puffs into the lungs every 6 (six) hours as needed for up to 10 days for wheezing or shortness of breath. 8.5 g 0   ALPRAZolam (XANAX) 0.25 MG tablet TAKE 2 TABLETS(0.5 MG) BY MOUTH AT BEDTIME AS NEEDED FOR SLEEP 60 tablet 3   azithromycin (ZITHROMAX) 250 MG tablet Take 1 tablet (250 mg total) by mouth daily. Take first 2 tablets together, then 1 every day until finished. 6 tablet 0   calcium carbonate (OS-CAL) 600 MG TABS Take 1,200 mg by mouth daily.     Coenzyme Q10-Fish Oil-Vit E (CO-Q 10 OMEGA-3 FISH OIL PO) Take by mouth.     DILANTIN 100 MG ER capsule TAKE 5 CAPSULES DAILY AS   DIRECTED 450 capsule 1   ezetimibe (ZETIA) 10 MG tablet Take 1 tablet (10 mg total) by mouth daily. 90 tablet 1   fexofenadine (ALLEGRA) 180 MG tablet Take 180 mg by mouth daily.     fluocinonide gel (LIDEX) 0.05 % Apply 1 application topically 3 (three) times daily as needed.      Melatonin 10 MG TABS Take by mouth.     MILK THISTLE PO      MULTIPLE VITAMINS PO Multiple Vitamin      naproxen (NAPROSYN) 500 MG tablet TAKE 1 TABLET(500 MG) BY MOUTH DAILY AS NEEDED FOR MODERATE PAIN 30 tablet 3   predniSONE (DELTASONE) 20 MG tablet Take 2 tablets as directed, then take 1 tablet x  5 days 9 tablet 0   Probiotic Product (PROBIOTIC ADVANCED PO) Take by mouth.     rosuvastatin (CRESTOR) 10 MG tablet Take 1 tablet (10 mg total) by mouth daily. 90 tablet 1   TURMERIC PO Take by mouth.     No current facility-administered medications for this visit.    Allergies: Patient has no known allergies.  Past Medical History:  Diagnosis Date   Allergy    Arthritis 2022   knees   Hx of colonic polyp 2007   Hyperlipidemia    Insomnia 06/02/2014   Seizures (HCC) 1983   Syncope    in 20s; no etiology, no recurrence. Dilantin Rxed    Past Surgical History:  Procedure Laterality Date   colonoscopy with polypectomy  2007   Dr Leone Payor, 4 mm rectal polyp   DENTAL SURGERY     bone grafts, to get implants   NASAL FRACTURE SURGERY     TONSILLECTOMY AND ADENOIDECTOMY      Family History  Problem Relation Age of Onset   Macular degeneration Father  Cancer Mother        throat; smoker   Macular degeneration Paternal Uncle    Rectal cancer Other        maternal cousin   Colon cancer Neg Hx    Esophageal cancer Neg Hx    Stomach cancer Neg Hx    Stroke Neg Hx    Heart disease Neg Hx    Diabetes Neg Hx    Prostate cancer Neg Hx     Social History   Tobacco Use   Smoking status: Never   Smokeless tobacco: Never  Substance Use Topics   Alcohol use: Yes    Alcohol/week: 6.0 - 8.0 standard drinks of alcohol    Types: 6 - 8 Cans of beer per week    Comment: drinks less , qod, 2 beers    Subjective:  Patient seen at [facility] on [facility] with suspected bronchitis; had returned from 10 day trip to [place] prior to onset of symptoms; was treated with Z-pak and prednisone; does feel that symptoms are improving but concerned about persisting expiratory wheezes; no fever, chest pain or  shortness of breath;    Objective:  Vitals:   06/20/23 1048 06/20/23 1506  BP: (!) 140/82 (!) 140/82  Pulse: 60   SpO2: 95%   Weight: 203 lb (92.1 kg)   Height: 5\' 10"  (1.778 m)     General: Well developed, well nourished, in no acute distress  Skin : Warm and dry.  Head: Normocephalic and atraumatic  Eyes: Sclera and conjunctiva clear; pupils round and reactive to light; extraocular movements intact  Ears: External normal; canals clear; tympanic membranes normal  Oropharynx: Pink, supple. No suspicious lesions  Neck: Supple without thyromegaly, adenopathy  Lungs: Respirations unlabored; coarse breath sounds noted in lower lobes/ expiratory wheezes noted CVS exam: normal rate and regular rhythm.  Neurologic: Alert and oriented; speech intact; face symmetrical; moves all extremities well; CNII-XII intact without focal deficit   Assessment:  1. Bronchopneumonia     Plan:   Patient is improved- suspect lingering inflammation; will extend prednisone for 7 more days; increase fluids, rest; follow up with his PCP in 2 weeks- may need to consider repeat CXR at that time if symptoms persist.   No follow-ups on file.  No orders of the defined types were placed in this encounter.   Requested Prescriptions   Signed Prescriptions Disp Refills   predniSONE (DELTASONE) 20 MG tablet 9 tablet 0    Sig: Take 2 tablets as directed, then take 1 tablet x  5 days

## 2023-07-04 ENCOUNTER — Encounter: Payer: Self-pay | Admitting: Internal Medicine

## 2023-07-04 ENCOUNTER — Ambulatory Visit (INDEPENDENT_AMBULATORY_CARE_PROVIDER_SITE_OTHER): Payer: Medicare Other | Admitting: Internal Medicine

## 2023-07-04 VITALS — BP 146/80 | HR 74 | Temp 98.2°F | Resp 18 | Ht 70.0 in | Wt 203.5 lb

## 2023-07-04 DIAGNOSIS — J189 Pneumonia, unspecified organism: Secondary | ICD-10-CM | POA: Diagnosis not present

## 2023-07-04 DIAGNOSIS — J9801 Acute bronchospasm: Secondary | ICD-10-CM | POA: Diagnosis not present

## 2023-07-04 MED ORDER — DOXYCYCLINE HYCLATE 100 MG PO TABS: 100.0000 mg | ORAL_TABLET | Freq: Two times a day (BID) | ORAL | 0 refills | Status: DC

## 2023-07-04 NOTE — Patient Instructions (Signed)
 Recommend doxycycline twice daily for 1 week  Albuterol only if you are wheezing.  Mucinex DM as needed  In 2 weeks, please go to the first floor and get a chest x-ray.  See you in April, sooner if your improvement does not continue.

## 2023-07-04 NOTE — Progress Notes (Signed)
 Subjective:    Patient ID: Kenneth Shape., male    DOB: 1955/08/16, 68 y.o.   MRN: [id number]  DOS:  07/04/2023 Type of visit - description: Follow-up  Seen at the urgent care 06/13/2023, had respiratory symptoms shortly after he came back from [place].  At the time, did not have any leg swelling, calf pain or chest pain. At the urgent care a chest x-ray showed left basilar consolidation -- PNM versus atelectasis, also had bronchospasm , was  Rx Z-Pak, prednisone, albuterol.  Seen at this office 06/20/2023, had persistent wheezing, was Rx for few more days of prednisone.  He is here for follow-up. Denies fever or chills. Cough has definitely improved,  mild at this point. Still has occasional sputum production, clear, never had hemoptysis. He is not short of breath but tired. Wheezing improved, has not used albuterol in several days.  Review of Systems See above   Past Medical History:  Diagnosis Date   Allergy    Arthritis 2022   knees   Hx of colonic polyp 2007   Hyperlipidemia    Insomnia 06/02/2014   Seizures (HCC) 1983   Syncope    in 20s; no etiology, no recurrence. Dilantin Rxed    Past Surgical History:  Procedure Laterality Date   colonoscopy with polypectomy  2007   Dr Leone Payor, 4 mm rectal polyp   DENTAL SURGERY     bone grafts, to get implants   NASAL FRACTURE SURGERY     TONSILLECTOMY AND ADENOIDECTOMY      Current Outpatient Medications  Medication Instructions   acetaminophen (TYLENOL) 650 mg, 2 times daily PRN   albuterol (VENTOLIN HFA) 108 (90 Base) MCG/ACT inhaler 2 puffs, Inhalation, Every 6 hours PRN   ALPRAZolam (XANAX) 0.25 MG tablet TAKE 2 TABLETS(0.5 MG) BY MOUTH AT BEDTIME AS NEEDED FOR SLEEP   azithromycin (ZITHROMAX) 250 mg, Oral, Daily, Take first 2 tablets together, then 1 every day until finished.   calcium carbonate (OS-CAL) 1,200 mg, Daily   Coenzyme Q10-Fish Oil-Vit E (CO-Q 10 OMEGA-3 FISH OIL PO) Take by mouth.   DILANTIN  100 MG ER capsule TAKE 5 CAPSULES DAILY AS   DIRECTED   ezetimibe (ZETIA) 10 mg, Oral, Daily   fexofenadine (ALLEGRA) 180 mg, Daily   fluocinonide gel (LIDEX) 0.05 % 1 application , 3 times daily PRN   Melatonin 10 MG TABS Take by mouth.   MILK THISTLE PO    MULTIPLE VITAMINS PO Multiple Vitamin   naproxen (NAPROSYN) 500 MG tablet TAKE 1 TABLET(500 MG) BY MOUTH DAILY AS NEEDED FOR MODERATE PAIN   predniSONE (DELTASONE) 20 MG tablet Take 2 tablets as directed, then take 1 tablet x  5 days   Probiotic Product (PROBIOTIC ADVANCED PO) Take by mouth.   rosuvastatin (CRESTOR) 10 mg, Oral, Daily   TURMERIC PO Take by mouth.       Objective:   Physical Exam BP (!) 152/80   Pulse 74   Temp 98.2 F (36.8 C) (Oral)   Resp 18   Ht 5\' 10"  (1.778 m)   Wt 203 lb 8 oz (92.3 kg)   SpO2 93%   BMI 29.20 kg/m  General:   Well developed, NAD, BMI noted. HEENT:  Normocephalic . Face symmetric, atraumatic Lungs:  Question of small amount of crackles at the left base. Normal respiratory effort, no intercostal retractions, no accessory muscle use. Heart: RRR,  no murmur.  Lower extremities: no pretibial edema bilaterally  Skin: Not  pale. Not jaundice Neurologic:  alert & oriented X3.  Speech normal, gait appropriate for age and unassisted Psych--  Cognition and judgment appear intact.  Cooperative with normal attention span and concentration.  Behavior appropriate. No anxious or depressed appearing.      Assessment     Assessment Hyperlipidemia Insomnia-- xanax prn, rx per pcp Allergies Syncope, in the 1980s, on Dilantin since then, used to see Dr. Sandria Manly, reluctant to stop dilantin Elevated LFTs:  Chronic, on-off x years  --  hepatitis B C (-) 2008, iron, ferritin, alpha-1 antitrypsin normal 2016.  --CT 4-/2018: Hepatic steatosis.   --SPEP 2019 (-).   --GI visit 02-2022: Multiple labs negative, likely multifactorial: EtOH, fatty liver.  Rec to f/u w/ PCP Incomplete RBBB-previously a  stress test was negative Osteopenia?   DEXA 07/18/2016 normal    PLAN: Pneumonia: Recent diagnosis of pneumonia, and bronchospasm. No fevers, cough has improved, bronchospasm improved. On exam, still have some crackles.  O2 sat 93% recheck  ----> 95%. Plan: Second round of antibiotic with doxycycline.  Chest x-ray in 2 weeks. Follow-up scheduled for April 16 Elevated BP: BP was slightly elevated upon arrival, recheck 146/80.

## 2023-07-05 NOTE — Assessment & Plan Note (Signed)
 Pneumonia: Recent diagnosis of pneumonia, and bronchospasm. No fevers, cough has improved, bronchospasm improved. On exam, still have some crackles.  O2 sat 93% recheck  ----> 95%. Plan: Second round of antibiotic with doxycycline.  Chest x-ray in 2 weeks. Follow-up scheduled for April 16 Elevated BP: BP was slightly elevated upon arrival, recheck 146/80.

## 2023-07-17 ENCOUNTER — Other Ambulatory Visit: Payer: Self-pay | Admitting: Internal Medicine

## 2023-07-18 ENCOUNTER — Other Ambulatory Visit: Payer: Self-pay | Admitting: Internal Medicine

## 2023-07-18 ENCOUNTER — Ambulatory Visit (HOSPITAL_BASED_OUTPATIENT_CLINIC_OR_DEPARTMENT_OTHER)
Admission: RE | Admit: 2023-07-18 | Discharge: 2023-07-18 | Disposition: A | Discharge status: Home / Self Care | Source: Ambulatory Visit | Attending: Internal Medicine | Admitting: Internal Medicine

## 2023-07-18 DIAGNOSIS — J189 Pneumonia, unspecified organism: Secondary | ICD-10-CM | POA: Insufficient documentation

## 2023-07-21 ENCOUNTER — Encounter: Payer: Self-pay | Admitting: Internal Medicine

## 2023-07-28 ENCOUNTER — Encounter: Payer: Self-pay | Admitting: Internal Medicine

## 2023-08-06 ENCOUNTER — Encounter: Payer: Self-pay | Admitting: Internal Medicine

## 2023-08-06 ENCOUNTER — Ambulatory Visit (INDEPENDENT_AMBULATORY_CARE_PROVIDER_SITE_OTHER): Payer: Medicare Other | Admitting: Internal Medicine

## 2023-08-06 VITALS — BP 122/74 | HR 53 | Temp 97.9°F | Resp 16 | Ht 70.0 in | Wt 206.4 lb

## 2023-08-06 DIAGNOSIS — R739 Hyperglycemia, unspecified: Secondary | ICD-10-CM

## 2023-08-06 DIAGNOSIS — R718 Other abnormality of red blood cells: Secondary | ICD-10-CM | POA: Diagnosis not present

## 2023-08-06 LAB — HEMOGLOBIN A1C: Hgb A1c MFr Bld: 5.3 % (ref 4.6–6.5)

## 2023-08-06 NOTE — Progress Notes (Signed)
 Subjective:    Patient ID: Kenneth Cho., male    DOB: 12/30/1955, 68 y.o.   MRN: [id number]  DOS:  08/06/2023 Type of visit - description: Follow-up  Routine follow-up.  Feeling well. Recently had pneumonia, currently feeling basically back to baseline.  No fever or chills.  No cough.  No difficulty breathing. Insomnia: Sleeping about 6 hours at night, often times wake up early.  Review of Systems See above   Past Medical History:  Diagnosis Date   Allergy    Arthritis 2022   knees   Hx of colonic polyp 2007   Hyperlipidemia    Insomnia 06/02/2014   Seizures (HCC) 1983   Syncope    in 20s; no etiology, no recurrence. Dilantin  Rxed    Past Surgical History:  Procedure Laterality Date   colonoscopy with polypectomy  2007   Dr Willy Harvest, 4 mm rectal polyp   DENTAL SURGERY     bone grafts, to get implants   NASAL FRACTURE SURGERY     TONSILLECTOMY AND ADENOIDECTOMY      Current Outpatient Medications  Medication Instructions   acetaminophen (TYLENOL) 650 mg, 2 times daily PRN   albuterol  (VENTOLIN  HFA) 108 (90 Base) MCG/ACT inhaler 2 puffs, Inhalation, Every 6 hours PRN   ALPRAZolam  (XANAX ) 0.25 MG tablet TAKE 2 TABLETS(0.5 MG) BY MOUTH AT BEDTIME AS NEEDED FOR SLEEP   calcium  carbonate (OS-CAL) 1,200 mg, Daily   Coenzyme Q10-Fish Oil-Vit E (CO-Q 10 OMEGA-3 FISH OIL PO) Take by mouth.   DILANTIN  100 MG ER capsule TAKE 5 CAPSULES DAILY AS   DIRECTED   ezetimibe  (ZETIA ) 10 mg, Oral, Daily   fexofenadine (ALLEGRA) 180 mg, Daily   fluocinonide gel (LIDEX) 0.05 % 1 application , 3 times daily PRN   Melatonin 10 MG TABS Take by mouth.   MILK THISTLE PO    MULTIPLE VITAMINS PO Multiple Vitamin   naproxen  (NAPROSYN ) 500 MG tablet TAKE 1 TABLET(500 MG) BY MOUTH DAILY AS NEEDED FOR MODERATE PAIN   Probiotic Product (PROBIOTIC ADVANCED PO) Take by mouth.   rosuvastatin  (CRESTOR ) 10 mg, Oral, Daily   TURMERIC PO Take by mouth.       Objective:   Physical Exam BP  122/74   Pulse (!) 53   Temp 97.9 F (36.6 C) (Oral)   Resp 16   Ht 5\' 10"  (1.778 m)   Wt 206 lb 6 oz (93.6 kg)   SpO2 97%   BMI 29.61 kg/m  General:   Well developed, NAD, BMI noted. HEENT:  Normocephalic . Face symmetric, atraumatic Lungs:  CTA B Normal respiratory effort, no intercostal retractions, no accessory muscle use. Heart: RRR,  no murmur.  Lower extremities: no pretibial edema bilaterally  Skin: Not pale. Not jaundice Neurologic:  alert & oriented X3.  Speech normal, gait appropriate for age and unassisted Psych--  Cognition and judgment appear intact.  Cooperative with normal attention span and concentration.  Behavior appropriate. No anxious or depressed appearing.      Assessment     Assessment Hyperlipidemia Insomnia-- xanax  prn, rx per pcp Allergies Syncope, in the 1980s, on Dilantin  since then, used to see Dr. Dania Dupre, reluctant to stop dilantin  Elevated LFTs:  Chronic, on-off x years  --  hepatitis B C (-) 2008, iron, ferritin, alpha-1 antitrypsin normal 2016.  --CT 4-/2018: Hepatic steatosis.   --SPEP 2019 (-).   --GI visit 02-2022: Multiple labs negative, likely multifactorial: EtOH, fatty liver.  Rec to f/u w/ PCP Incomplete  RBBB-previously a stress test was negative Osteopenia?   DEXA 07/18/2016 normal Increase MCV: Normal B12, thiamine and folic acid   PLAN: Hyperlipidemia: On Crestor , Zetia , recheck FLP on RTC Insomnia: Sleeping 6 hours a night, he would like to sleep a little more.  Continue with Xanax  as needed, good sleep  habits discussed with the patient. Pneumonia: At the last visit, the patient received a second round of antibiotics, chest x-ray 07/18/2023 no acute changes.  Feels great, physical exam is normal, O2 sat normal.  Resolved. Hyperglycemia: Mild, per chart review, check A1c. EtOH: 2 beers usually but not every night, recommend moderation. RTC CPX 01-2024

## 2023-08-06 NOTE — Patient Instructions (Addendum)
 INSTRUCTIONS  FOR TODAY   GO TO THE LAB : Get the blood work     Next office visit for a physical exam by October 2025 Please make an appointment before you leave today    HEALTHY SLEEP Sleep hygiene: Basic rules for a good night's sleep  Sleep only as much as you need to feel rested and then get out of bed  Keep a regular sleep schedule  Avoid forcing sleep  Exercise regularly for at least 20 minutes, preferably 4 to 5 hours before bedtime  Avoid caffeinated beverages after lunch  Avoid alcohol near bedtime: no "night cap"  Avoid smoking, especially in the evening  Do not go to bed hungry  Adjust bedroom environment  Avoid prolonged use of light-emitting screens before bedtime   Deal with your worries before bedtime

## 2023-08-07 ENCOUNTER — Encounter: Payer: Self-pay | Admitting: Internal Medicine

## 2023-08-08 NOTE — Assessment & Plan Note (Signed)
 Hyperlipidemia: On Crestor , Zetia , recheck FLP on RTC Insomnia: Sleeping 6 hours a night, he would like to sleep a little more.  Continue with Xanax  as needed, good sleep  habits discussed with the patient. Pneumonia: At the last visit, the patient received a second round of antibiotics, chest x-ray 07/18/2023 no acute changes.  Feels great, physical exam is normal, O2 sat normal.  Resolved. Hyperglycemia: Mild, per chart review, check A1c. EtOH: 2 beers usually but not every night, recommend moderation. RTC CPX 01-2024

## 2023-08-20 ENCOUNTER — Other Ambulatory Visit: Payer: Self-pay

## 2023-08-20 ENCOUNTER — Ambulatory Visit: Payer: Self-pay

## 2023-08-20 ENCOUNTER — Ambulatory Visit (HOSPITAL_BASED_OUTPATIENT_CLINIC_OR_DEPARTMENT_OTHER): Admitting: Orthopaedic Surgery

## 2023-08-20 ENCOUNTER — Emergency Department (HOSPITAL_COMMUNITY)
Admission: EM | Admit: 2023-08-20 | Discharge: 2023-08-20 | Disposition: A | Discharge status: Home / Self Care | Attending: Emergency Medicine | Admitting: Emergency Medicine

## 2023-08-20 ENCOUNTER — Encounter (HOSPITAL_COMMUNITY): Payer: Self-pay

## 2023-08-20 ENCOUNTER — Emergency Department (HOSPITAL_COMMUNITY)

## 2023-08-20 DIAGNOSIS — K5900 Constipation, unspecified: Secondary | ICD-10-CM | POA: Insufficient documentation

## 2023-08-20 LAB — URINALYSIS, ROUTINE W REFLEX MICROSCOPIC
Bilirubin Urine: NEGATIVE
Glucose, UA: NEGATIVE mg/dL
Hgb urine dipstick: NEGATIVE
Ketones, ur: NEGATIVE mg/dL
Leukocytes,Ua: NEGATIVE
Nitrite: NEGATIVE
Protein, ur: NEGATIVE mg/dL
Specific Gravity, Urine: 1.016 (ref 1.005–1.030)
pH: 6 (ref 5.0–8.0)

## 2023-08-20 LAB — COMPREHENSIVE METABOLIC PANEL WITH GFR
ALT: 78 U/L — ABNORMAL HIGH (ref 0–44)
AST: 53 U/L — ABNORMAL HIGH (ref 15–41)
Albumin: 4 g/dL (ref 3.5–5.0)
Alkaline Phosphatase: 70 U/L (ref 38–126)
Anion gap: 11 (ref 5–15)
BUN: 14 mg/dL (ref 8–23)
CO2: 22 mmol/L (ref 22–32)
Calcium: 9 mg/dL (ref 8.9–10.3)
Chloride: 95 mmol/L — ABNORMAL LOW (ref 98–111)
Creatinine, Ser: 0.78 mg/dL (ref 0.61–1.24)
GFR, Estimated: >60 mL/min (ref >60)
Glucose, Bld: 142 mg/dL — ABNORMAL HIGH (ref 70–99)
Potassium: 4.4 mmol/L (ref 3.5–5.1)
Sodium: 128 mmol/L — ABNORMAL LOW (ref 135–145)
Total Bilirubin: 0.8 mg/dL (ref 0.0–1.2)
Total Protein: 6.6 g/dL (ref 6.5–8.1)

## 2023-08-20 LAB — CBC
HCT: 48 % (ref 39.0–52.0)
Hemoglobin: 16.5 g/dL (ref 13.0–17.0)
MCH: 34.3 pg — ABNORMAL HIGH (ref 26.0–34.0)
MCHC: 34.4 g/dL (ref 30.0–36.0)
MCV: 99.8 fL (ref 80.0–100.0)
Platelets: 211 10*3/uL (ref 150–400)
RBC: 4.81 MIL/uL (ref 4.22–5.81)
RDW: 11.9 % (ref 11.5–15.5)
WBC: 20.3 10*3/uL — ABNORMAL HIGH (ref 4.0–10.5)
nRBC: 0 % (ref 0.0–0.2)

## 2023-08-20 LAB — LIPASE, BLOOD: Lipase: 23 U/L (ref 11–51)

## 2023-08-20 MED ORDER — FLEET ENEMA RE ENEM
1.0000 | ENEMA | Freq: Once | RECTAL | Status: AC
  Administered 2023-08-20: 1 via RECTAL
  Filled 2023-08-20: qty 1

## 2023-08-20 NOTE — ED Notes (Signed)
 Pt came to UC with severe abdominal pain and unable to have BM. Advised to go to ED for further evaluation

## 2023-08-20 NOTE — ED Notes (Signed)
 Pt had BM after enema. This RN notified Dr. Leighton Punches.

## 2023-08-20 NOTE — ED Provider Notes (Signed)
 [facility] AT [facility] Provider Note   CSN: [id number] Arrival date & time: 08/20/23  1711     History  Chief Complaint  Patient presents with   Abdominal Pain   Constipation   Urinary Retention    Kenneth [name]. is a 68 y.o. male.  69 year old male presents with constipation x 3 days.  Denies any nausea or vomiting.  States he has had some urinary retention.  Has attempted to self medicate with Dulcolax as well as coffee as well as water without relief.  States when he does urinate only small amounts come out.  Denies any fever or chills.  No prior history of abdominal surgeries.  Denies any rectal bleeding       Home Medications Prior to Admission medications   Medication Sig Start Date End Date Taking? Authorizing Provider  acetaminophen (TYLENOL) 650 MG CR tablet Take 650 mg by mouth 2 (two) times daily as needed for pain.    [provider]  albuterol  (VENTOLIN  HFA) 108 (90 Base) MCG/ACT inhaler Inhale 2 puffs into the lungs every 6 (six) hours as needed for up to 10 days for wheezing or shortness of breath. Patient not taking: Reported on 08/06/2023 06/13/23   Acevedo, Angela, PA  ALPRAZolam  (XANAX ) 0.25 MG tablet TAKE 2 TABLETS(0.5 MG) BY MOUTH AT BEDTIME AS NEEDED FOR SLEEP 02/05/23   Paz, Jose E, MD  calcium  carbonate (OS-CAL) 600 MG TABS Take 1,200 mg by mouth daily.    [provider]  Coenzyme Q10-Fish Oil-Vit E (CO-Q 10 OMEGA-3 FISH OIL PO) Take by mouth.    [provider]  DILANTIN  100 MG ER capsule TAKE 5 CAPSULES DAILY AS   DIRECTED 03/03/23   Paz, Jose E, MD  ezetimibe  (ZETIA ) 10 MG tablet Take 1 tablet (10 mg total) by mouth daily. 07/18/23   Paz, Jose E, MD  fexofenadine (ALLEGRA) 180 MG tablet Take 180 mg by mouth daily.    [provider]  fluocinonide gel (LIDEX) 0.05 % Apply 1 application topically 3 (three) times daily as needed.  05/17/13   [provider]  Melatonin 10 MG  TABS Take by mouth.    [provider]  MILK THISTLE PO  04/22/22   [provider]  MULTIPLE VITAMINS PO Multiple Vitamin    [provider]  naproxen  (NAPROSYN ) 500 MG tablet TAKE 1 TABLET(500 MG) BY MOUTH DAILY AS NEEDED FOR MODERATE PAIN 03/29/21   Paz, Jose E, MD  Probiotic Product (PROBIOTIC ADVANCED PO) Take by mouth.    [provider]  rosuvastatin  (CRESTOR ) 10 MG tablet Take 1 tablet (10 mg total) by mouth daily. 07/18/23   Paz, Jose E, MD  TURMERIC PO Take by mouth.    [provider]      Allergies    Patient has no known allergies.    Review of Systems   Review of Systems  All other systems reviewed and are negative.   Physical Exam Updated Vital Signs BP 138/70 (BP Location: Left Arm)   Pulse 65   Temp 98.3 F (36.8 C) (Oral)   Resp 18   SpO2 97%  Physical Exam Vitals and nursing note reviewed. Exam conducted with a chaperone present.  Constitutional:      General: He is not in acute distress.    Appearance: Normal appearance. He is well-developed. He is not toxic-appearing.  HENT:     Head: Normocephalic and atraumatic.  Eyes:  General: Lids are normal.     Conjunctiva/sclera: Conjunctivae normal.     Pupils: Pupils are equal, round, and reactive to light.  Neck:     Thyroid : No thyroid  mass.     Trachea: No tracheal deviation.  Cardiovascular:     Rate and Rhythm: Normal rate and regular rhythm.     Heart sounds: Normal heart sounds. No murmur heard.    No gallop.  Pulmonary:     Effort: Pulmonary effort is normal. No respiratory distress.     Breath sounds: Normal breath sounds. No stridor. No decreased breath sounds, wheezing, rhonchi or rales.  Abdominal:     General: There is no distension.     Palpations: Abdomen is soft.     Tenderness: There is no abdominal tenderness. There is no rebound.  Genitourinary:    Rectum: External hemorrhoid present.     Comments: Soft stool noted in rectal  vault Musculoskeletal:        General: No tenderness. Normal range of motion.     Cervical back: Normal range of motion and neck supple.  Skin:    General: Skin is warm and dry.     Findings: No abrasion or rash.  Neurological:     Mental Status: He is alert and oriented to person, place, and time. Mental status is at baseline.     GCS: GCS eye subscore is 4. GCS verbal subscore is 5. GCS motor subscore is 6.     Cranial Nerves: No cranial nerve deficit.     Sensory: No sensory deficit.     Motor: Motor function is intact.  Psychiatric:        Attention and Perception: Attention normal.        Speech: Speech normal.        Behavior: Behavior normal.     ED Results / Procedures / Treatments   Labs (all labs ordered are listed, but only abnormal results are displayed) Labs Reviewed  COMPREHENSIVE METABOLIC PANEL WITH GFR - Abnormal; Notable for the following components:      Result Value   Sodium 128 (*)    Chloride 95 (*)    Glucose, Bld 142 (*)    AST 53 (*)    ALT 78 (*)    All other components within normal limits  CBC - Abnormal; Notable for the following components:   WBC 20.3 (*)    MCH 34.3 (*)    All other components within normal limits  LIPASE, BLOOD  URINALYSIS, ROUTINE W REFLEX MICROSCOPIC    EKG None  Radiology No results found.  Procedures Procedures    Medications Ordered in ED Medications  sodium phosphate (FLEET) enema 1 enema (has no administration in time range)    ED Course/ Medical Decision Making/ A&P                                 Medical Decision Making Amount and/or Complexity of Data Reviewed Labs: ordered. Radiology: ordered.  Risk OTC drugs.   Patient given fleets enema by nursing and had very good results.  Patient states he feels much better at this time.  Had over 500 cc of urine in his bladder likely from his constipation which was drained and feels better.  He denies any urinary symptoms.  His urinalysis was negative  for infection.  Does have a white count of 20,000.  Repeat abdominal exam shows that he has  soft nontender.  Patient reason presented with feeling abdominal fullness but without real abdominal pain.  Abdominal CT ordered for patient but shared decision making done with patient and wife and they would like to hold off on this.  Explained to them that they could be other serious pathology present in his abdomen .  Patient states will follow-up with his doctor.  They live close by and will return if symptoms become worse.       Final Clinical Impression(s) / ED Diagnoses Final diagnoses:  None    Rx / DC Orders ED Discharge Orders     None         Lind Repine, MD 08/20/23 2247

## 2023-08-20 NOTE — ED Notes (Signed)
 Pt stated unable to give an urine specimen at this time due urinary retention

## 2023-08-20 NOTE — Discharge Instructions (Signed)
 Return here at once should you develop abdominal pain, fever, vomiting.  Avoid protein shakes as we discussed as well as protein bars as this could be the etiology of her constipation.  Follow-up with your doctor

## 2023-08-20 NOTE — ED Notes (Signed)
 This RN bladder scanned patient. 560 mL on bladder scan. Dr. Leighton Punches, MD notified.

## 2023-08-20 NOTE — ED Notes (Signed)
 Dr. Leighton Punches ok with pt being discharged before CT scan.

## 2023-08-20 NOTE — ED Triage Notes (Addendum)
 Pt came in for abdominal pain, lower back pain, constipation, and urinary retention. Pt has been constipation since this morning. Pt took dulcolax x3 times with no relief. Pt has not urinated since last night as well.

## 2023-08-20 NOTE — ED Notes (Signed)
 This RN straight cathed pt. Total output 550 mL.

## 2023-08-22 ENCOUNTER — Telehealth: Payer: Self-pay | Admitting: Internal Medicine

## 2023-08-22 NOTE — Telephone Encounter (Signed)
 Requesting: alprazolam  0.25mg  Contract: 02/05/23 UDS: 02/05/23 Last Visit: 08/06/23 Next Visit: 02/10/24 Last Refill: 02/05/23 #60 and 3RF    Please Advise

## 2023-08-22 NOTE — Telephone Encounter (Signed)
 PDMP okay, Rx sent

## 2023-08-25 ENCOUNTER — Telehealth: Payer: Self-pay

## 2023-08-25 NOTE — Telephone Encounter (Signed)
 Spoke w/ Pt- he was seen on 08/20/23 at ED for constipation and urinary retention. He called wanting to see if PCP could order the CT scan that the ED recommended. He was scheduled for visit on Wednesday but he has a funeral, we moved appt to tomorrow, 5/6. He is having very small BMs (last BM this morning) since ED visit, recommended he start miralax 1 capful daily. I asked about the urinary retention, states he has no issues passing urine.

## 2023-08-25 NOTE — Telephone Encounter (Signed)
 Copied from CRM [id number]. Topic: Clinical - Medical Advice >> Aug 25, 2023  9:38 AM Kenneth [name] wrote: Patient wants a call back regarding his ER visit and the recurrence of the same problem

## 2023-08-26 ENCOUNTER — Ambulatory Visit (INDEPENDENT_AMBULATORY_CARE_PROVIDER_SITE_OTHER): Admitting: Internal Medicine

## 2023-08-26 ENCOUNTER — Ambulatory Visit (HOSPITAL_BASED_OUTPATIENT_CLINIC_OR_DEPARTMENT_OTHER): Admitting: Student

## 2023-08-26 ENCOUNTER — Encounter: Payer: Self-pay | Admitting: Internal Medicine

## 2023-08-26 ENCOUNTER — Encounter (HOSPITAL_BASED_OUTPATIENT_CLINIC_OR_DEPARTMENT_OTHER): Payer: Self-pay | Admitting: Student

## 2023-08-26 VITALS — BP 136/82 | HR 71 | Temp 98.2°F | Resp 16 | Ht 70.0 in | Wt 205.4 lb

## 2023-08-26 DIAGNOSIS — E871 Hypo-osmolality and hyponatremia: Secondary | ICD-10-CM

## 2023-08-26 DIAGNOSIS — M2351 Chronic instability of knee, right knee: Secondary | ICD-10-CM | POA: Diagnosis not present

## 2023-08-26 DIAGNOSIS — K5900 Constipation, unspecified: Secondary | ICD-10-CM | POA: Diagnosis not present

## 2023-08-26 DIAGNOSIS — M25562 Pain in left knee: Secondary | ICD-10-CM | POA: Diagnosis not present

## 2023-08-26 DIAGNOSIS — M25561 Pain in right knee: Secondary | ICD-10-CM | POA: Diagnosis not present

## 2023-08-26 DIAGNOSIS — E8889 Other specified metabolic disorders: Secondary | ICD-10-CM

## 2023-08-26 MED ORDER — TRIAMCINOLONE ACETONIDE 40 MG/ML IJ SUSP
2.0000 mL | INTRAMUSCULAR | Status: AC | PRN
  Administered 2023-08-26: 2 mL via INTRA_ARTICULAR

## 2023-08-26 MED ORDER — LIDOCAINE HCL 1 % IJ SOLN
4.0000 mL | INTRAMUSCULAR | Status: AC | PRN
Start: 2023-08-26 — End: 2023-08-26
  Administered 2023-08-26: 4 mL

## 2023-08-26 MED ORDER — VITAMIN D3 50 MCG (2000 UT) PO CAPS: 2000.0000 [IU] | ORAL_CAPSULE | Freq: Every day | ORAL | Status: AC

## 2023-08-26 NOTE — Progress Notes (Signed)
 Chief Complaint: Bilateral knee pain     History of Present Illness:   08/26/2023: Patient presents to clinic today for follow-up evaluation of bilateral knee pain.  He has received a few rounds of bilateral Hoffa pad injections, last on 04/22/2023, which have been giving him relief.  Last injections lasted for almost 3 months.  States that both knees are equally as bothersome.  He does have a known chronic ACL tear of the right knee.  Pain has been worsening, particularly at night.  This generally also worsens when he is an active.  He has been taping, resting, icing, and taking occasional ibuprofen.   Kenneth [name]. is a 68 y.o. male presents today with bilateral knee pain that has been ongoing now for many years.  He has been seeing Dr. Cleora Daft as well as Dr. Lucienne Ryder.  He did have MRIs obtained of bilateral knees which did show a chronic appearing ACL tear on the right knee.  With regard to his pain he is experiencing this deep in the joint.  He is not able to kneel on either knee or work on his classic car.  He has been using NSAIDs as well as compression with limited relief.  The pain is predominantly central.  Denies any instability or feeling like the right knee is giving out.  He has previously had injections as well as hyaluronic acid without significant relief.  He is here today as referral from my partner Dr. Lucienne Ryder.   Surgical History:   None  PMH/PSH/Family History/Social History/Meds/Allergies:    Past Medical History:  Diagnosis Date   Allergy    Arthritis 2022   knees   Hx of colonic polyp 2007   Hyperlipidemia    Insomnia 06/02/2014   Seizures (HCC) 1983   Syncope    in 20s; no etiology, no recurrence. 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, sports  Tobacco Use   Smoking status: Never   Smokeless tobacco: Never  Vaping Use   Vaping status: Never Used  Substance and Sexual Activity   Alcohol use: Yes    Alcohol/week: 6.0 - 8.0 standard drinks of alcohol    Types: 6 - 8 Cans of beer per week    Comment: drinks less , qod, 2 beers   Drug use: Not Currently    Types: PCP   Sexual activity: Not Currently  Other Topics Concern   Not on file  Social History Narrative   Household pt and wife   Social Drivers of Corporate investment banker Strain: Low Risk  (08/05/2023)   Overall Financial Resource Strain (CARDIA)    Difficulty of Paying Living Expenses: Not hard at all  Food Insecurity: No Food Insecurity (08/05/2023)   Hunger Vital Sign    Worried About Running Out of Food in the Last Year: Never true    Ran Out of Food in the Last Year: Never true  Transportation Needs: No Transportation Needs (08/05/2023)   PRAPARE - Administrator, Civil Service (Medical): No    Lack of Transportation (Non-Medical): No  Physical Activity: Insufficiently Active (08/05/2023)   Exercise Vital Sign    Days of Exercise per Week: 3 days    Minutes of Exercise per Session: 20 min  Stress: No Stress Concern Present (08/05/2023)   [employer] of Occupational Health - Occupational Stress Questionnaire    Feeling of Stress : Only a little  Social Connections: Socially Integrated (08/05/2023)   Social Connection and Isolation Panel [NHANES]    Frequency of Communication with Friends and Family: Once a week    Frequency of Social Gatherings with Friends and Family: More than three times  a week    Attends Religious Services: 1 to 4 times per year    Active Member of [employer] or Organizations: Yes    Attends Banker Meetings: 1 to 4 times per year    Marital Status: Married   Family History  Problem Relation Age of Onset   Macular degeneration Father    Cancer Mother        throat; smoker   Macular degeneration Paternal Uncle    Rectal cancer Other        maternal cousin   Colon cancer Neg Hx    Esophageal cancer Neg Hx    Stomach cancer Neg Hx    Stroke Neg Hx    Heart disease Neg Hx    Diabetes Neg Hx    Prostate cancer Neg Hx    Not on File Current Outpatient Medications  Medication Sig Dispense Refill   acetaminophen (TYLENOL) 650 MG CR tablet Take 650 mg by mouth 2 (two) times daily as needed for pain.     albuterol  (VENTOLIN  HFA) 108 (90 Base) MCG/ACT inhaler Inhale 2 puffs into the lungs every 6 (six) hours as needed for up to 10 days for wheezing or shortness of breath. (Patient not taking: Reported on 08/06/2023) 8.5 g 0   ALPRAZolam  (XANAX ) 0.25 MG tablet Take 2 tablets (0.5 mg total) by mouth at bedtime as needed for sleep. 60 tablet 5   calcium  carbonate (OS-CAL) 600 MG TABS Take 1,200 mg by mouth daily.     Coenzyme Q10-Fish Oil-Vit E (CO-Q 10 OMEGA-3 FISH OIL PO) Take by mouth.     DILANTIN  100 MG ER capsule TAKE 5 CAPSULES DAILY AS   DIRECTED 450 capsule 1   ezetimibe  (ZETIA ) 10 MG tablet Take 1 tablet (10 mg total) by mouth daily. 90 tablet 1   fexofenadine (ALLEGRA) 180 MG tablet Take 180 mg by mouth daily.     fluocinonide gel (LIDEX) 0.05 % Apply 1 application topically 3 (three) times daily as needed.      Melatonin 10 MG TABS Take by mouth.     MILK THISTLE PO      MULTIPLE VITAMINS PO Multiple Vitamin     naproxen  (NAPROSYN ) 500 MG tablet TAKE 1 TABLET(500 MG) BY MOUTH DAILY AS NEEDED FOR MODERATE PAIN 30 tablet 3   Probiotic Product (PROBIOTIC ADVANCED PO) Take by mouth.     rosuvastatin  (CRESTOR ) 10 MG tablet Take 1 tablet (10 mg  total) by mouth daily. 90 tablet 1  TURMERIC PO Take by mouth.     No current facility-administered medications for this visit.   No results found.  Review of Systems:   A ROS was performed including pertinent positives and negatives as documented in the HPI.  Physical Exam :   Constitutional: NAD and appears stated age Neurological: Alert and oriented Psych: Appropriate affect and cooperative There were no vitals taken for this visit.   Comprehensive Musculoskeletal Exam:    Evaluation of bilateral knees demonstrates active range of motion from 0 to 120 degrees without any significant crepitus.  Tenderness over both joint lines immediately adjacent to the patellar tendons.  Stable collaterals with varus and valgus stress.  No overlying erythema or warmth  Imaging:    Assessment:   68 y.o. male chronic bilateral knee pain.  He has received multiple rounds of Hoffa pad injections, which have been giving him significant relief lasting 3 to 4 months.  Previous intra-articular injections did not provide relief.  He has been using multiple conservative methods which do help control the symptoms, however pain has been slowly worsening recently.  He would like to remain as active as possible, so I have offered to repeat these injections today.  Injections were performed of bilateral Hoffa pads and he tolerated this well.  Will plan to have him follow-up in clinic as needed.  Plan :    -Bilateral knee Hoffa pad injections performed today -Return to clinic as needed     Procedure Note  Patient: Kenneth [name].             Date of Birth: Jul 29, 1955           MRN: [id number]             Visit Date: 08/26/2023  Procedures: Visit Diagnoses:  1. Hoffa's fat pad disease (HCC)      Large Joint Inj: bilateral knee on 08/26/2023 12:36 PM Indications: pain Details: 22 G 1.5 in needle, ultrasound-guided anterolateral approach Medications (Right): 4 mL lidocaine  1 %; 2 mL triamcinolone   acetonide 40 MG/ML Medications (Left): 4 mL lidocaine  1 %; 2 mL triamcinolone  acetonide 40 MG/ML Outcome: tolerated well, no immediate complications Procedure, treatment alternatives, risks and benefits explained, specific risks discussed. Consent was given by the patient. Immediately prior to procedure a time out was called to verify the correct patient, procedure, equipment, support staff and site/side marked as required. Patient was prepped and draped in the usual sterile fashion.      I personally saw and evaluated the patient, and participated in the management and treatment plan.   Sharrell Deck, PA-C Orthopedics

## 2023-08-26 NOTE — Patient Instructions (Addendum)
 Stop calcium  supplements  Okay to take vitamin D  2000 units daily  Eat a bowl of fruits every day  Okay to continue with Metamucil 1 capsule daily  Drink plenty of fluids  Use MiraLAX as needed for constipation, okay to take it daily if needed.  If above is not working well you can use Dulcolax  If you continue having problems let me know  If you have severe constipation, nausea, vomiting, fever: Go to the ER again.   See you in October

## 2023-08-26 NOTE — Progress Notes (Unsigned)
 Subjective:    Patient ID: Kenneth Cho., male    DOB: 03/31/1956, 68 y.o.   MRN: [id number]  DOS:  08/26/2023 Type of visit - description: Acute, ER follow-up.  The patient developed constipation gradually few days prior to the ER visit, he noted bowel movements were less frequent and stools were smaller. Right before the ER visit he noted that even his urine output was difficult . At no point had fever or chills. No nausea vomiting. No dysuria, no hematuria.  ER visit 08/20/2023 reviewed: Sodium 128, chloride 95, increased LFTs, white count 20, hemoglobin normal, lipase normal, urinalysis negative.  Receive Fleet enema, good results, felt better. Due to elevated WBCs, was recommended a CT abdomen, patient declined.  At this point is better but not 100%.  BMs are still not back to normal.  No LUTS.  Review of Systems See above   Past Medical History:  Diagnosis Date   Allergy    Arthritis 2022   knees   Hx of colonic polyp 2007   Hyperlipidemia    Insomnia 06/02/2014   Seizures (HCC) 1983   Syncope    in 20s; no etiology, no recurrence. Dilantin  Rxed    Past Surgical History:  Procedure Laterality Date   colonoscopy with polypectomy  2007   Dr Willy Harvest, 4 mm rectal polyp   DENTAL SURGERY     bone grafts, to get implants   NASAL FRACTURE SURGERY     TONSILLECTOMY AND ADENOIDECTOMY      Current Outpatient Medications  Medication Instructions   acetaminophen (TYLENOL) 650 mg, 2 times daily PRN   albuterol  (VENTOLIN  HFA) 108 (90 Base) MCG/ACT inhaler 2 puffs, Inhalation, Every 6 hours PRN   ALPRAZolam  (XANAX ) 0.5 mg, Oral, At bedtime PRN   calcium  carbonate (OS-CAL) 1,200 mg, Daily   Coenzyme Q10-Fish Oil-Vit E (CO-Q 10 OMEGA-3 FISH OIL PO) Take by mouth.   DILANTIN  100 MG ER capsule TAKE 5 CAPSULES DAILY AS   DIRECTED   ezetimibe  (ZETIA ) 10 mg, Oral, Daily   fexofenadine (ALLEGRA) 180 mg, Daily   fluocinonide gel (LIDEX) 0.05 % 1 application , 3 times daily  PRN   Melatonin 10 MG TABS Take by mouth.   MILK THISTLE PO    MULTIPLE VITAMINS PO Multiple Vitamin   naproxen  (NAPROSYN ) 500 MG tablet TAKE 1 TABLET(500 MG) BY MOUTH DAILY AS NEEDED FOR MODERATE PAIN   Probiotic Product (PROBIOTIC ADVANCED PO) Take by mouth.   rosuvastatin  (CRESTOR ) 10 mg, Oral, Daily   TURMERIC PO Take by mouth.       Objective:   Physical Exam BP 136/82   Pulse 71   Temp 98.2 F (36.8 C) (Oral)   Resp 16   Ht 5\' 10"  (1.778 m)   Wt 205 lb 6 oz (93.2 kg)   SpO2 94%   BMI 29.47 kg/m  General:   Well developed, NAD, BMI noted.  HEENT:  Normocephalic . Face symmetric, atraumatic Lungs:  CTA B Normal respiratory effort, no intercostal retractions, no accessory muscle use. Heart: RRR,  no murmur.  Abdomen:  Not distended, soft, non-tender. No rebound or rigidity.   Skin: Not pale. Not jaundice Lower extremities: no pretibial edema bilaterally  Neurologic:  alert & oriented X3.  Speech normal, gait appropriate for age and unassisted Psych--  Cognition and judgment appear intact.  Cooperative with normal attention span and concentration.  Behavior appropriate. No anxious or depressed appearing.     Assessment  Assessment Hyperlipidemia Insomnia-- xanax  prn, rx per pcp Allergies Syncope, in the 1980s, on Dilantin  since then, used to see Dr. Dania Dupre, reluctant to stop dilantin  Elevated LFTs:  Chronic, on-off x years  --  hepatitis B C (-) 2008, iron, ferritin, alpha-1 antitrypsin normal 2016.  --CT 4-/2018: Hepatic steatosis.   --SPEP 2019 (-).   --GI visit 02-2022: Multiple labs negative, likely multifactorial: EtOH, fatty liver.  Rec to f/u w/ PCP Incomplete RBBB-previously a stress test was negative Osteopenia?   DEXA 07/18/2016 normal Increase MCV: Normal B12, thiamine and folic acid   PLAN: Constipation: As described above. No new medications, white count and electrolytes were abnormal at the ER. He is feeling better but not 100%, still  believes his stool output is slightly decreased.. Plan: Continue Metamucil, stop calcium  which he has taken chronically, continue vitamin D . Increase fluid intake, MiraLAX as needed, daily if needed. Recheck electrolytes and WBC.  Also check a TSH. Call if not gradually better. Last colonoscopy 2023. All instructions printed for the patient, he verbalized understanding RTC scheduled for 01-2024

## 2023-08-27 ENCOUNTER — Inpatient Hospital Stay: Admitting: Internal Medicine

## 2023-08-27 LAB — COMPREHENSIVE METABOLIC PANEL WITH GFR
ALT: 47 U/L (ref 0–53)
AST: 31 U/L (ref 0–37)
Albumin: 4.1 g/dL (ref 3.5–5.2)
Alkaline Phosphatase: 77 U/L (ref 39–117)
BUN: 13 mg/dL (ref 6–23)
CO2: 25 meq/L (ref 19–32)
Calcium: 9 mg/dL (ref 8.4–10.5)
Chloride: 95 meq/L — ABNORMAL LOW (ref 96–112)
Creatinine, Ser: 0.87 mg/dL (ref 0.40–1.50)
GFR: 87.8 mL/min (ref >60.00)
Glucose, Bld: 212 mg/dL — ABNORMAL HIGH (ref 70–99)
Potassium: 4.1 meq/L (ref 3.5–5.1)
Sodium: 129 meq/L — ABNORMAL LOW (ref 135–145)
Total Bilirubin: 0.3 mg/dL (ref 0.2–1.2)
Total Protein: 6.2 g/dL (ref 6.0–8.3)

## 2023-08-27 LAB — CBC WITH DIFFERENTIAL/PLATELET
Basophils Absolute: 0 10*3/uL (ref 0.0–0.1)
Basophils Relative: 0.5 % (ref 0.0–3.0)
Eosinophils Absolute: 0 10*3/uL (ref 0.0–0.7)
Eosinophils Relative: 0.8 % (ref 0.0–5.0)
HCT: 45 % (ref 39.0–52.0)
Hemoglobin: 15.4 g/dL (ref 13.0–17.0)
Lymphocytes Relative: 10.1 % — ABNORMAL LOW (ref 12.0–46.0)
Lymphs Abs: 0.6 10*3/uL — ABNORMAL LOW (ref 0.7–4.0)
MCHC: 34.3 g/dL (ref 30.0–36.0)
MCV: 102.7 fl — ABNORMAL HIGH (ref 78.0–100.0)
Monocytes Absolute: 0.3 10*3/uL (ref 0.1–1.0)
Monocytes Relative: 5.3 % (ref 3.0–12.0)
Neutro Abs: 4.7 10*3/uL (ref 1.4–7.7)
Neutrophils Relative %: 83.3 % — ABNORMAL HIGH (ref 43.0–77.0)
Platelets: 275 10*3/uL (ref 150.0–400.0)
RBC: 4.38 Mil/uL (ref 4.22–5.81)
RDW: 12.1 % (ref 11.5–15.5)
WBC: 5.7 10*3/uL (ref 4.0–10.5)

## 2023-08-27 LAB — TSH: TSH: 0.79 u[IU]/mL (ref 0.35–5.50)

## 2023-08-27 NOTE — Assessment & Plan Note (Signed)
 Constipation: As described above. No new medications, white count and electrolytes were abnormal at the ER. He is feeling better but not 100%, still believes his stool output is slightly decreased.. Plan: Continue Metamucil, stop calcium  which he has taken chronically, continue vitamin D . Increase fluid intake, MiraLAX as needed, daily if needed. Recheck electrolytes and WBC.  Also check a TSH. Call if not gradually better. Last colonoscopy 2023. All instructions printed for the patient, he verbalized understanding RTC scheduled for 01-2024

## 2023-08-28 ENCOUNTER — Ambulatory Visit

## 2023-08-28 DIAGNOSIS — Z0000 Encounter for general adult medical examination without abnormal findings: Secondary | ICD-10-CM | POA: Diagnosis not present

## 2023-08-28 NOTE — Patient Instructions (Signed)
 Kenneth [name] , Thank you for taking time to come for your Medicare Wellness Visit. I appreciate your ongoing commitment to your health goals. Please review the following plan we discussed and let me know if I can assist you in the future.   Referrals/Orders/Follow-Ups/Clinician Recommendations: none  This is a list of the screening recommended for you and due dates:  Health Maintenance  Topic Date Due   COVID-19 Vaccine (5 - 2024-25 season) 12/22/2022   Flu Shot  11/21/2023   Medicare Annual Wellness Visit  08/27/2024   DTaP/Tdap/Td vaccine (3 - Td or Tdap) 07/19/2026   Colon Cancer Screening  04/18/2029   Pneumonia Vaccine  Completed   Hepatitis C Screening  Completed   Zoster (Shingles) Vaccine  Completed   HPV Vaccine  Aged Out   Meningitis B Vaccine  Aged Out    Advanced directives: (Copy Requested) Please bring a copy of your health care power of attorney and living will to the office to be added to your chart at your convenience. You can mail to [facility] [address] [place], [place] [address] or email to [email or web address]  Next Medicare Annual Wellness Visit scheduled for next year: Yes  Have you seen your provider in the last 6 months (3 months if uncontrolled diabetes)? Yes, appointment 02/10/2024  insert Preventive Care attachment Insert FALL PREVENTION attachment if needed

## 2023-08-28 NOTE — Progress Notes (Signed)
 Subjective:   Kenneth [name]. is a 68 y.o. who presents for a Medicare Wellness preventive visit.  Visit Complete: Virtual I connected with  [name]. on 08/28/23 by a audio enabled telemedicine application and verified that I am speaking with the correct person using two identifiers.  Patient Location: Home  Provider Location: Home Office  I discussed the limitations of evaluation and management by telemedicine. The patient expressed understanding and agreed to proceed.  Vital Signs: Because this visit was a virtual/telehealth visit, some criteria may be missing or patient reported. Any vitals not documented were not able to be obtained and vitals that have been documented are patient reported.  VideoError- Librarian, academic were attempted between this provider and patient, however failed, due to patient having technical difficulties OR patient did not have access to video capability.  We continued and completed visit with audio only.   Persons Participating in Visit: Patient.  AWV Questionnaire: No: Patient Medicare AWV questionnaire was not completed prior to this visit.  Cardiac Risk Factors include: advanced age (>55men, >61 women);dyslipidemia;male gender     Objective:    Today's Vitals   There is no height or weight on file to calculate BMI.     08/28/2023   10:51 AM 08/20/2023    5:29 PM 08/26/2022   11:04 AM 08/07/2021   10:27 AM  Advanced Directives  Does Patient Have a Medical Advance Directive? Yes Yes Yes Yes  Type of Estate agent of [place];Living will Healthcare Power of [place];Living will Healthcare Power of [place];Living will Healthcare Power of [place];Living will  Does patient want to make changes to medical advance directive?   No - Patient declined No - Patient declined  Copy of Healthcare Power of Attorney in Chart? No - copy requested  No - copy requested No - copy requested     Current Medications (verified) Outpatient Encounter Medications as of 08/28/2023  Medication Sig   acetaminophen (TYLENOL) 650 MG CR tablet Take 650 mg by mouth 2 (two) times daily as needed for pain.   ALPRAZolam  (XANAX ) 0.25 MG tablet Take 2 tablets (0.5 mg total) by mouth at bedtime as needed for sleep.   Cholecalciferol (VITAMIN D3) 50 MCG (2000 UT) capsule Take 1 capsule (2,000 Units total) by mouth daily.   Coenzyme Q10-Fish Oil-Vit E (CO-Q 10 OMEGA-3 FISH OIL PO) Take by mouth.   DILANTIN  100 MG ER capsule TAKE 5 CAPSULES DAILY AS   DIRECTED   ezetimibe  (ZETIA ) 10 MG tablet Take 1 tablet (10 mg total) by mouth daily.   fexofenadine (ALLEGRA) 180 MG tablet Take 180 mg by mouth daily.   fluocinonide gel (LIDEX) 0.05 % Apply 1 application topically 3 (three) times daily as needed.    Melatonin 10 MG TABS Take by mouth.   MILK THISTLE PO    MULTIPLE VITAMINS PO Multiple Vitamin   naproxen  (NAPROSYN ) 500 MG tablet TAKE 1 TABLET(500 MG) BY MOUTH DAILY AS NEEDED FOR MODERATE PAIN   Probiotic Product (PROBIOTIC ADVANCED PO) Take by mouth.   rosuvastatin  (CRESTOR ) 10 MG tablet Take 1 tablet (10 mg total) by mouth daily.   TURMERIC PO Take by mouth.   albuterol  (VENTOLIN  HFA) 108 (90 Base) MCG/ACT inhaler Inhale 2 puffs into the lungs every 6 (six) hours as needed for up to 10 days for wheezing or shortness of breath. (Patient not taking: Reported on 08/28/2023)   No facility-administered encounter medications on file as of 08/28/2023.  Allergies (verified) Patient has no known allergies.   History: Past Medical History:  Diagnosis Date   Allergy    Arthritis 2022   knees   Hx of colonic polyp 2007   Hyperlipidemia    Insomnia 06/02/2014   Seizures (HCC) 1983   Syncope    in 20s; no etiology, no recurrence. Dilantin  Rxed   Past Surgical History:  Procedure Laterality Date   colonoscopy with polypectomy  2007   Dr Willy Harvest, 4 mm rectal polyp   DENTAL SURGERY     bone grafts,  to get implants   NASAL FRACTURE SURGERY     TONSILLECTOMY AND ADENOIDECTOMY     Family History  Problem Relation Age of Onset   Macular degeneration Father    Cancer Mother        throat; smoker   Macular degeneration Paternal Uncle    Rectal cancer Other        maternal cousin   Colon cancer Neg Hx    Esophageal cancer Neg Hx    Stomach cancer Neg Hx    Stroke Neg Hx    Heart disease Neg Hx    Diabetes Neg Hx    Prostate cancer Neg Hx    Social History   Socioeconomic History   Marital status: Married    Spouse name: Not on file   Number of children: 0   Years of education: Not on file   Highest education level: Some college, no degree  Occupational History   Occupation: part Scientist, physiological, 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, sports  Tobacco Use   Smoking status: Never   Smokeless tobacco: Never  Vaping Use   Vaping status: Never Used  Substance and Sexual Activity   Alcohol use: Yes    Alcohol/week: 2.0 standard drinks of alcohol    Types: 2 Cans of beer per week    Comment: drinks less , qod, 2 beers   Drug use: Not Currently    Types: PCP   Sexual activity: Not Currently  Other Topics Concern   Not on file  Social History Narrative   Household pt and wife   Social Drivers of Corporate investment banker Strain: Low Risk  (08/28/2023)   Overall Financial Resource Strain (CARDIA)    Difficulty of Paying Living Expenses: Not hard at all  Food Insecurity: No Food Insecurity (08/28/2023)   Hunger Vital Sign    Worried About Running Out of Food in the Last Year: Never true    Ran Out of Food in the Last Year: Never true  Transportation Needs: No Transportation Needs (08/28/2023)   PRAPARE - Administrator, Civil Service (Medical): No    Lack of Transportation (Non-Medical): No  Physical Activity: Inactive (08/28/2023)   Exercise Vital Sign    Days of Exercise per Week: 0 days    Minutes of Exercise per Session: 0 min  Stress: No Stress  Concern Present (08/28/2023)   [employer] of Occupational Health - Occupational Stress Questionnaire    Feeling of Stress : Not at all  Social Connections: Socially Integrated (08/28/2023)   Social Connection and Isolation Panel [NHANES]    Frequency of Communication with Friends and Family: More than three times a week    Frequency of Social Gatherings with Friends and Family: More than three times a week    Attends Religious Services: 1 to 4 times per year    Active Member of [employer] or Organizations: Yes  Attends Engineer, structural: More than 4 times per year    Marital Status: Married    Tobacco Counseling Counseling given: Not Answered    Clinical Intake:  Pre-visit preparation completed: Yes  Pain : No/denies pain     Nutritional Risks: None Diabetes: No  Lab Results  Component Value Date   HGBA1C 5.3 08/06/2023   HGBA1C 5.4 05/26/2019   HGBA1C 5.4 03/02/2018     How often do you need to have someone help you when you read instructions, pamphlets, or other written materials from your doctor or pharmacy?: 1 - Never  Interpreter Needed?: No  Information entered by :: NAllen LPN   Activities of Daily Living     08/28/2023   10:47 AM  In your present state of health, do you have any difficulty performing the following activities:  Hearing? 0  Vision? 0  Difficulty concentrating or making decisions? 0  Walking or climbing stairs? 0  Dressing or bathing? 0  Doing errands, shopping? 0  Preparing Food and eating ? N  Using the Toilet? N  In the past six months, have you accidently leaked urine? N  Do you have problems with loss of bowel control? N  Managing your Medications? N  Managing your Finances? N  Housekeeping or managing your Housekeeping? N    Patient Care Team: Ezell Hollow, MD as PCP - General (Internal Medicine) Randol Butt., MD as Consulting Physician (Urology)  Indicate any recent Medical Services you may have received  from other than [facility] providers in the past year (date may be approximate).     Assessment:    This is a routine wellness examination for Kailen.  Hearing/Vision screen Hearing Screening - Comments:: Denies hearing issues Vision Screening - Comments:: Regular eye exams, Dr. Alto Atta   Goals Addressed             This Visit's Progress    Patient Stated       08/28/2023, stay healthy       Depression Screen     08/28/2023   10:53 AM 07/04/2023    2:36 PM 02/05/2023   10:35 AM 08/26/2022   11:06 AM 07/26/2022   11:25 AM 06/24/2022    3:17 PM 01/30/2022    9:00 AM  PHQ 2/9 Scores  PHQ - 2 Score 0 0 0 0 0 0 0    Fall Risk     08/28/2023   10:52 AM 07/04/2023    2:36 PM 02/05/2023   10:35 AM 08/25/2022   10:07 PM 07/26/2022   11:25 AM  Fall Risk   Falls in the past year? 0 0 0 1 1  Number falls in past yr: 0 0 0 0 0  Injury with Fall? 0 0 0 0 0  Risk for fall due to : Medication side effect   No Fall Risks   Follow up Falls prevention discussed;Falls evaluation completed Falls evaluation completed;Education provided Falls evaluation completed Falls evaluation completed Falls evaluation completed    MEDICARE RISK AT HOME:  Medicare Risk at Home Any stairs in or around the home?: Yes If so, are there any without handrails?: No Home free of loose throw rugs in walkways, pet beds, electrical cords, etc?: Yes Adequate lighting in your home to reduce risk of falls?: Yes Life alert?: No Use of a cane, walker or w/c?: No Grab bars in the bathroom?: Yes Shower chair or bench in shower?: No Elevated toilet seat or a handicapped toilet?: Yes  TIMED UP AND GO:  Was the test performed?  No  Cognitive Function: 6CIT completed        08/28/2023   10:53 AM 08/26/2022   11:09 AM 08/07/2021   10:27 AM  6CIT Screen  What Year? 0 points 0 points 0 points  What month? 0 points 0 points 0 points  What time? 0 points 0 points 0 points  Count back from 20 0 points 0 points 0 points  Months in  reverse 0 points 0 points 0 points  Repeat phrase 0 points 0 points 0 points  Total Score 0 points 0 points 0 points    Immunizations Immunization History  Administered Date(s) Administered   Fluad Quad(high Dose 65+) 01/30/2019, 01/12/2020, 01/24/2021, 01/30/2022   Fluad Trivalent(High Dose 65+) 02/05/2023   Influenza Split 01/01/2012, 02/22/2014, 02/05/2017   Influenza Whole 02/23/2007   Influenza,inj,Quad PF,6+ Mos 01/21/2018   Influenza-Unspecified 02/18/2013, 01/20/2015, 01/19/2016   PFIZER(Purple Top)SARS-COV-2 Vaccination 06/05/2019, 06/29/2019, 02/10/2020   Pfizer Covid-19 Vaccine Bivalent Booster 84yrs & up 03/06/2021   Pneumococcal Conjugate-13 11/19/2018   Pneumococcal Polysaccharide-23 01/12/2020   Td 12/22/2006   Tdap 07/18/2016   Zoster Recombinant(Shingrix) 03/11/2018, 01/30/2019    Screening Tests Health Maintenance  Topic Date Due   COVID-19 Vaccine (5 - 2024-25 season) 12/22/2022   INFLUENZA VACCINE  11/21/2023   Medicare Annual Wellness (AWV)  08/27/2024   DTaP/Tdap/Td (3 - Td or Tdap) 07/19/2026   Colonoscopy  04/18/2029   Pneumonia Vaccine 43+ Years old  Completed   Hepatitis C Screening  Completed   Zoster Vaccines- Shingrix  Completed   HPV VACCINES  Aged Out   Meningococcal B Vaccine  Aged Out    Health Maintenance  Health Maintenance Due  Topic Date Due   COVID-19 Vaccine (5 - 2024-25 season) 12/22/2022   Health Maintenance Items Addressed: Declines covid vaccine  Additional Screening:  Vision Screening: Recommended annual ophthalmology exams for early detection of glaucoma and other disorders of the eye.  Dental Screening: Recommended annual dental exams for proper oral hygiene  Community Resource Referral / Chronic Care Management: CRR required this visit?  No   CCM required this visit?  No     Plan:     I have personally reviewed and noted the following in the patient's chart:   Medical and social history Use of alcohol,  tobacco or illicit drugs  Current medications and supplements including opioid prescriptions. Patient is not currently taking opioid prescriptions. Functional ability and status Nutritional status Physical activity Advanced directives List of other physicians Hospitalizations, surgeries, and ER visits in previous 12 months Vitals Screenings to include cognitive, depression, and falls Referrals and appointments  In addition, I have reviewed and discussed with patient certain preventive protocols, quality metrics, and best practice recommendations. A written personalized care plan for preventive services as well as general preventive health recommendations were provided to patient.     Areatha Beecham, LPN   04/27/1094   After Visit Summary: (MyChart) Due to this being a telephonic visit, the after visit summary with patients personalized plan was offered to patient via MyChart   Notes: Nothing significant to report at this time.

## 2023-08-29 ENCOUNTER — Encounter: Payer: Self-pay | Admitting: Internal Medicine

## 2023-08-29 NOTE — Addendum Note (Signed)
 Addended by: Aftin Lye D on: 08/29/2023 01:19 PM   Modules accepted: Orders

## 2023-09-03 ENCOUNTER — Other Ambulatory Visit (INDEPENDENT_AMBULATORY_CARE_PROVIDER_SITE_OTHER)

## 2023-09-03 DIAGNOSIS — E871 Hypo-osmolality and hyponatremia: Secondary | ICD-10-CM | POA: Diagnosis not present

## 2023-09-04 LAB — BASIC METABOLIC PANEL WITH GFR
BUN: 18 mg/dL (ref 6–23)
CO2: 24 meq/L (ref 19–32)
Calcium: 9.4 mg/dL (ref 8.4–10.5)
Chloride: 98 meq/L (ref 96–112)
Creatinine, Ser: 0.85 mg/dL (ref 0.40–1.50)
GFR: 88.41 mL/min (ref >60.00)
Glucose, Bld: 89 mg/dL (ref 70–99)
Potassium: 4.2 meq/L (ref 3.5–5.1)
Sodium: 132 meq/L — ABNORMAL LOW (ref 135–145)

## 2023-09-04 LAB — SODIUM, URINE, RANDOM: Sodium, Ur: 23 mmol/L — ABNORMAL LOW (ref 28–272)

## 2023-09-04 LAB — OSMOLALITY: Osmolality: 281 mosm/kg (ref 278–305)

## 2023-09-05 ENCOUNTER — Ambulatory Visit: Payer: Self-pay | Admitting: Internal Medicine

## 2023-09-16 ENCOUNTER — Telehealth: Payer: Self-pay | Admitting: Internal Medicine

## 2023-09-16 ENCOUNTER — Other Ambulatory Visit: Payer: Self-pay | Admitting: Internal Medicine

## 2023-09-16 NOTE — Telephone Encounter (Unsigned)
 Copied from CRM [id number]. Topic: Clinical - Medication Refill >> Sep 16, 2023 11:54 AM Jenice Mitts wrote: Medication: DILANTIN  100 MG ER capsule   Has the patient contacted their pharmacy? Yes (Agent: If no, request that the patient contact the pharmacy for the refill. If patient does not wish to contact the pharmacy document the reason why and proceed with request.) (Agent: If yes, when and what did the pharmacy advise?)  This is the patient's preferred pharmacy:   [facility] - [place], [place] - [address] [facility] [address] AT Portal to Registered Caremark Sites [address] [place] [address] Phone: [phone] Fax: [phone]  Is this the correct pharmacy for this prescription? Yes If no, delete pharmacy and type the correct one.   Has the prescription been filled recently? No  Is the patient out of the medication? Yes  Has the patient been seen for an appointment in the last year OR does the patient have an upcoming appointment? Yes  Can we respond through MyChart? Yes  Agent: Please be advised that Rx refills may take up to 3 business days. We ask that you follow-up with your pharmacy.

## 2023-09-17 NOTE — Telephone Encounter (Signed)
Rx was sent yesterday.

## 2023-10-22 ENCOUNTER — Telehealth (HOSPITAL_BASED_OUTPATIENT_CLINIC_OR_DEPARTMENT_OTHER): Payer: Self-pay | Admitting: Orthopaedic Surgery

## 2023-10-22 MED ORDER — MELOXICAM 15 MG PO TABS: 15.0000 mg | ORAL_TABLET | Freq: Every day | ORAL | 0 refills | Status: DC

## 2023-10-22 NOTE — Telephone Encounter (Signed)
 Patient wants to know when he can get is next injection I told him 3 months from May. He wants to know if he can get something for the pain to sleep at [facility]

## 2023-10-22 NOTE — Telephone Encounter (Signed)
 I called and talked to the pt. He is frustrated that this keeps coming back. He wants to see Dr Genelle again to discuss possible surgery/other options for this. I scheduled him a appointment with Dr Genelle. I also discuss meloxicam with him. He wanted to try this. I sent the rx to the [facility] pharmacy on file

## 2023-11-04 ENCOUNTER — Ambulatory Visit
Admission: RE | Admit: 2023-11-04 | Discharge: 2023-11-04 | Disposition: A | Discharge status: Home / Self Care | Source: Ambulatory Visit

## 2023-11-04 VITALS — BP 164/87 | HR 66 | Temp 97.6°F | Resp 17

## 2023-11-04 DIAGNOSIS — H8110 Benign paroxysmal vertigo, unspecified ear: Secondary | ICD-10-CM

## 2023-11-04 MED ORDER — MECLIZINE HCL 25 MG PO TABS
25.0000 mg | ORAL_TABLET | Freq: Once | ORAL | Status: AC
  Administered 2023-11-04: 25 mg via ORAL

## 2023-11-04 MED ORDER — MECLIZINE HCL 25 MG PO TABS: 25.0000 mg | ORAL_TABLET | Freq: Three times a day (TID) | ORAL | 0 refills | Status: AC | PRN

## 2023-11-04 NOTE — ED Provider Notes (Signed)
 [facility]  Note:  This document was prepared using Dragon voice recognition software and may include unintentional dictation errors.  MRN: [id number] DOB: [birth year]  Subjective:   Kenneth [name]. is a 68 y.o. male presenting for intermittent dizziness since this morning.  Patient reports after waking up and getting out of bed he began feeling dizziness.  Patient reports he notices more dizziness when looking at the ground or when standing up quickly.  Patient denies any loss of consciousness or nausea/vomiting.  Patient denies any past history of dizziness or vertigo.  Patient has no history of cardiac syndrome or vasovagal response.  Patient does however have past history of syncope.  Patient states that he has been testing out his symptoms and notices when he looks at his feet to tie his shoe or if he turns his head quickly he develops symptoms.  This prompted patient to follow-up in urgent care for evaluation.  No current facility-administered medications for this encounter.  Current Outpatient Medications:    meclizine  (ANTIVERT ) 25 MG tablet, Take 1 tablet (25 mg total) by mouth 3 (three) times daily as needed for dizziness., Disp: 30 tablet, Rfl: 0   acetaminophen  (TYLENOL ) 650 MG CR tablet, Take 650 mg by mouth 2 (two) times daily as needed for pain., Disp: , Rfl:    albuterol  (VENTOLIN  HFA) 108 (90 Base) MCG/ACT inhaler, Inhale 2 puffs into the lungs every 6 (six) hours as needed for up to 10 days for wheezing or shortness of breath. (Patient not taking: Reported on 08/28/2023), Disp: 8.5 g, Rfl: 0   ALPRAZolam  (XANAX ) 0.25 MG tablet, Take 2 tablets (0.5 mg total) by mouth at bedtime as needed for sleep., Disp: 60 tablet, Rfl: 5   Cholecalciferol (VITAMIN D3) 50 MCG (2000 UT) capsule, Take 1 capsule (2,000 Units total) by mouth daily., Disp: , Rfl:    Coenzyme Q10-Fish Oil-Vit E (CO-Q 10 OMEGA-3 FISH OIL PO), Take by mouth., Disp: , Rfl:    DILANTIN  100 MG ER  capsule, TAKE 5 CAPSULES DAILY AS   DIRECTED, Disp: 450 capsule, Rfl: 1   ezetimibe  (ZETIA ) 10 MG tablet, Take 1 tablet (10 mg total) by mouth daily., Disp: 90 tablet, Rfl: 1   fexofenadine (ALLEGRA) 180 MG tablet, Take 180 mg by mouth daily., Disp: , Rfl:    fluocinonide gel (LIDEX) 0.05 %, Apply 1 application topically 3 (three) times daily as needed. , Disp: , Rfl:    Melatonin 10 MG TABS, Take by mouth., Disp: , Rfl:    meloxicam  (MOBIC ) 15 MG tablet, Take 1 tablet (15 mg total) by mouth daily., Disp: 30 tablet, Rfl: 0   MILK THISTLE PO, , Disp: , Rfl:    MULTIPLE VITAMINS PO, Multiple Vitamin, Disp: , Rfl:    naproxen  (NAPROSYN ) 500 MG tablet, TAKE 1 TABLET(500 MG) BY MOUTH DAILY AS NEEDED FOR MODERATE PAIN, Disp: 30 tablet, Rfl: 3   Probiotic Product (PROBIOTIC ADVANCED PO), Take by mouth., Disp: , Rfl:    rosuvastatin  (CRESTOR ) 10 MG tablet, Take 1 tablet (10 mg total) by mouth daily., Disp: 90 tablet, Rfl: 1   TURMERIC PO, Take by mouth., Disp: , Rfl:    No Known Allergies  Past Medical History:  Diagnosis Date   Allergy    Arthritis 2022   knees   Hx of colonic polyp 2007   Hyperlipidemia    Insomnia 06/02/2014   Seizures (HCC) 1983   Syncope    in 20s; no etiology, no  recurrence. Dilantin  Rxed     Past Surgical History:  Procedure Laterality Date   colonoscopy with polypectomy  2007   Dr Avram, 4 mm rectal polyp   DENTAL SURGERY     bone grafts, to get implants   NASAL FRACTURE SURGERY     TONSILLECTOMY AND ADENOIDECTOMY      Family History  Problem Relation Age of Onset   Macular degeneration Father    Cancer Mother        throat; smoker   Macular degeneration Paternal Uncle    Rectal cancer Other        maternal cousin   Colon cancer Neg Hx    Esophageal cancer Neg Hx    Stomach cancer Neg Hx    Stroke Neg Hx    Heart disease Neg Hx    Diabetes Neg Hx    Prostate cancer Neg Hx     Social History   Tobacco Use   Smoking status: Never   Smokeless  tobacco: Never  Vaping Use   Vaping status: Never Used  Substance Use Topics   Alcohol use: Yes    Alcohol/week: 2.0 standard drinks of alcohol    Types: 2 Cans of beer per week    Comment: drinks less , qod, 2 beers   Drug use: Not Currently    Types: PCP    ROS Refer to HPI for ROS details.  Objective:    Vitals: BP (!) 164/87 (BP Location: Right Arm)   Pulse 66   Temp 97.6 F (36.4 C) (Oral)   Resp 17   SpO2 98%   Physical Exam Vitals and nursing note reviewed.  Constitutional:      General: He is not in acute distress.    Appearance: Normal appearance. He is well-developed. He is not ill-appearing or toxic-appearing.  HENT:     Head: Normocephalic.     Right Ear: Tympanic membrane, ear canal and external ear normal.     Left Ear: Tympanic membrane, ear canal and external ear normal.     Nose: Nose normal. No congestion or rhinorrhea.     Mouth/Throat:     Mouth: Mucous membranes are moist.     Pharynx: Oropharynx is clear. No posterior oropharyngeal erythema.  Eyes:     General: Lids are normal. Vision grossly intact. Gaze aligned appropriately. No visual field deficit.       Right eye: No discharge.        Left eye: No discharge.     Extraocular Movements: Extraocular movements intact.     Right eye: Normal extraocular motion and no nystagmus.     Left eye: Normal extraocular motion and no nystagmus.     Conjunctiva/sclera: Conjunctivae normal.     Right eye: Right conjunctiva is not injected. No exudate.    Left eye: Left conjunctiva is not injected. No exudate.    Pupils: Pupils are equal, round, and reactive to light.  Cardiovascular:     Rate and Rhythm: Normal rate and regular rhythm.     Heart sounds: Normal heart sounds. No murmur heard. Pulmonary:     Effort: Pulmonary effort is normal. No respiratory distress.     Breath sounds: Normal breath sounds. No stridor. No wheezing, rhonchi or rales.  Chest:     Chest wall: No tenderness.  Skin:     General: Skin is warm and dry.  Neurological:     General: No focal deficit present.     Mental Status: He is  alert and oriented to person, place, and time.     Cranial Nerves: No cranial nerve deficit.     Sensory: No sensory deficit.     Motor: No weakness.     Coordination: Coordination normal.     Gait: Gait normal.  Psychiatric:        Mood and Affect: Mood normal.        Behavior: Behavior normal.        Thought Content: Thought content normal.        Judgment: Judgment normal.     Procedures  No results found for this or any previous visit (from the past 24 hours).  Assessment and Plan :     Discharge Instructions       1. Benign paroxysmal positional vertigo, unspecified laterality (Primary) - ED EKG completed in UC shows normal sinus rhythm with a ventricular rate of 64 bpm, possible right bundle branch block, abnormal EKG, no STEMI. - meclizine  (ANTIVERT ) tablet 25 mg given in UC for acute dizziness. - meclizine  (ANTIVERT ) 25 MG tablet; Take 1 tablet (25 mg total) by mouth 3 (three) times daily as needed for dizziness.  Dispense: 30 tablet; Refill: 0 - Continue to monitor symptoms for any change in severity if there is any escalation of your current symptoms or development of new symptoms follow-up in ER for further evaluation and management.      Jaquille Kau B Crystalyn Delia   Yida Hyams, [place] B, [place] 11/04/23 1545

## 2023-11-04 NOTE — Discharge Instructions (Addendum)
  1. Benign paroxysmal positional vertigo, unspecified laterality (Primary) - ED EKG completed in UC shows normal sinus rhythm with a ventricular rate of 64 bpm, possible right bundle branch block, abnormal EKG, no STEMI. - meclizine  (ANTIVERT ) tablet 25 mg given in UC for acute dizziness. - meclizine  (ANTIVERT ) 25 MG tablet; Take 1 tablet (25 mg total) by mouth 3 (three) times daily as needed for dizziness.  Dispense: 30 tablet; Refill: 0 - Continue to monitor symptoms for any change in severity if there is any escalation of your current symptoms or development of new symptoms follow-up in ER for further evaluation and management.

## 2023-11-04 NOTE — ED Triage Notes (Signed)
 Pt c/o dizziness that began this morning. States it usually only happens when he looks up and then the room spins.

## 2023-11-05 ENCOUNTER — Ambulatory Visit (HOSPITAL_BASED_OUTPATIENT_CLINIC_OR_DEPARTMENT_OTHER): Admitting: Orthopaedic Surgery

## 2023-11-05 ENCOUNTER — Other Ambulatory Visit (HOSPITAL_BASED_OUTPATIENT_CLINIC_OR_DEPARTMENT_OTHER): Payer: Self-pay

## 2023-11-05 ENCOUNTER — Ambulatory Visit (HOSPITAL_BASED_OUTPATIENT_CLINIC_OR_DEPARTMENT_OTHER): Payer: Self-pay | Admitting: Orthopaedic Surgery

## 2023-11-05 DIAGNOSIS — E8889 Other specified metabolic disorders: Secondary | ICD-10-CM | POA: Diagnosis not present

## 2023-11-05 MED ORDER — OXYCODONE HCL 5 MG PO TABS
5.0000 mg | ORAL_TABLET | ORAL | 0 refills | Status: DC | PRN
  Filled 2023-11-05: qty 10, 2d supply, fill #0

## 2023-11-05 MED ORDER — ASPIRIN 325 MG PO TBEC
325.0000 mg | DELAYED_RELEASE_TABLET | Freq: Every day | ORAL | 0 refills | Status: DC
  Filled 2023-11-05: qty 14, 14d supply, fill #0

## 2023-11-05 MED ORDER — ACETAMINOPHEN 500 MG PO TABS
500.0000 mg | ORAL_TABLET | Freq: Three times a day (TID) | ORAL | 0 refills | Status: AC
  Filled 2023-11-05: qty 30, 10d supply, fill #0

## 2023-11-05 MED ORDER — IBUPROFEN 800 MG PO TABS
800.0000 mg | ORAL_TABLET | Freq: Three times a day (TID) | ORAL | 0 refills | Status: AC
  Filled 2023-11-05: qty 30, 10d supply, fill #0

## 2023-11-05 NOTE — Progress Notes (Signed)
 Chief Complaint: Right knee pain     History of Present Illness:   11/05/2023: Presents today for follow-up of his bilateral knees.  He did get very good relief from his injection although this is subsequently worn off on both sides  Kenneth Mach. is a 68 y.o. male presents today with bilateral knee pain that has been ongoing now for many years.  He has been seeing Dr. Leonce as well as Dr. Vernetta.  He did have MRIs obtained of bilateral knees which did show a chronic appearing ACL tear on the right knee.  With regard to his pain he is experiencing this deep in the joint.  He is not able to kneel on either knee or work on his classic car.  He has been using NSAIDs as well as compression with limited relief.  The pain is predominantly central.  Denies any instability or feeling like the right knee is giving out.  He has previously had injections as well as hyaluronic acid without significant relief.  He is here today as referral from my partner Dr. Vernetta.    Surgical History:   None  PMH/PSH/Family History/Social History/Meds/Allergies:    Past Medical History:  Diagnosis Date   Allergy    Arthritis 2022   knees   Hx of colonic polyp 2007   Hyperlipidemia    Insomnia 06/02/2014   Seizures (HCC) 1983   Syncope    in 20s; no etiology, no recurrence. 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, sports  Tobacco Use    Smoking status: Never   Smokeless tobacco: Never  Vaping Use   Vaping status: Never Used  Substance and Sexual Activity   Alcohol use: Yes    Alcohol/week: 2.0 standard drinks of alcohol    Types: 2 Cans of beer per week    Comment: drinks less , qod, 2 beers   Drug use: Not Currently    Types: PCP   Sexual activity: Not Currently  Other Topics Concern   Not on file  Social History Narrative   Household pt and wife   Social Drivers of Corporate investment banker Strain: Low Risk  (08/28/2023)   Overall Financial Resource Strain (CARDIA)    Difficulty of Paying Living Expenses: Not hard at all  Food Insecurity: No Food Insecurity (08/28/2023)   Hunger Vital Sign    Worried About Running Out of Food in the Last Year: Never true    Ran Out of Food in the Last Year: Never true  Transportation Needs: No Transportation Needs (08/28/2023)   PRAPARE - Administrator, Civil Service (Medical): No    Lack of Transportation (Non-Medical): No  Physical  Activity: Inactive (08/28/2023)   Exercise Vital Sign    Days of Exercise per Week: 0 days    Minutes of Exercise per Session: 0 min  Stress: No Stress Concern Present (08/28/2023)   [employer] of Occupational Health - Occupational Stress Questionnaire    Feeling of Stress : Not at all  Social Connections: Socially Integrated (08/28/2023)   Social Connection and Isolation Panel    Frequency of Communication with Friends and Family: More than three times a week    Frequency of Social Gatherings with Friends and Family: More than three times a week    Attends Religious Services: 1 to 4 times per year    Active Member of [employer] or Organizations: Yes    Attends Engineer, structural: More than 4 times per year    Marital Status: Married   Family History  Problem Relation Age of Onset   Macular degeneration Father    Cancer Mother        throat; smoker   Macular degeneration Paternal Uncle    Rectal cancer Other         maternal cousin   Colon cancer Neg Hx    Esophageal cancer Neg Hx    Stomach cancer Neg Hx    Stroke Neg Hx    Heart disease Neg Hx    Diabetes Neg Hx    Prostate cancer Neg Hx    No Known Allergies Current Outpatient Medications  Medication Sig Dispense Refill   acetaminophen  (TYLENOL ) 500 MG tablet Take 1 tablet (500 mg total) by mouth every 8 (eight) hours for 10 days. 30 tablet 0   aspirin  EC 325 MG tablet Take 1 tablet (325 mg total) by mouth daily. 14 tablet 0   ibuprofen  (ADVIL ) 800 MG tablet Take 1 tablet (800 mg total) by mouth every 8 (eight) hours for 10 days. Please take with food, please alternate with acetaminophen . 30 tablet 0   oxyCODONE  (ROXICODONE ) 5 MG immediate release tablet Take 1 tablet (5 mg total) by mouth every 4 (four) hours as needed for severe pain (pain score 7-10) or breakthrough pain. 10 tablet 0   acetaminophen  (TYLENOL ) 650 MG CR tablet Take 650 mg by mouth 2 (two) times daily as needed for pain.     albuterol  (VENTOLIN  HFA) 108 (90 Base) MCG/ACT inhaler Inhale 2 puffs into the lungs every 6 (six) hours as needed for up to 10 days for wheezing or shortness of breath. (Patient not taking: Reported on 08/28/2023) 8.5 g 0   ALPRAZolam  (XANAX ) 0.25 MG tablet Take 2 tablets (0.5 mg total) by mouth at bedtime as needed for sleep. 60 tablet 5   Cholecalciferol (VITAMIN D3) 50 MCG (2000 UT) capsule Take 1 capsule (2,000 Units total) by mouth daily.     Coenzyme Q10-Fish Oil-Vit E (CO-Q 10 OMEGA-3 FISH OIL PO) Take by mouth.     DILANTIN  100 MG ER capsule TAKE 5 CAPSULES DAILY AS   DIRECTED 450 capsule 1   ezetimibe  (ZETIA ) 10 MG tablet Take 1 tablet (10 mg total) by mouth daily. 90 tablet 1   fexofenadine (ALLEGRA) 180 MG tablet Take 180 mg by mouth daily.     fluocinonide gel (LIDEX) 0.05 % Apply 1 application topically 3 (three) times daily as needed.      meclizine  (ANTIVERT ) 25 MG tablet Take 1 tablet (25 mg total) by mouth 3 (three) times daily as needed for  dizziness. 30 tablet 0   Melatonin 10 MG TABS Take  by mouth.     meloxicam  (MOBIC ) 15 MG tablet Take 1 tablet (15 mg total) by mouth daily. 30 tablet 0   MILK THISTLE PO      MULTIPLE VITAMINS PO Multiple Vitamin     naproxen  (NAPROSYN ) 500 MG tablet TAKE 1 TABLET(500 MG) BY MOUTH DAILY AS NEEDED FOR MODERATE PAIN 30 tablet 3   Probiotic Product (PROBIOTIC ADVANCED PO) Take by mouth.     rosuvastatin  (CRESTOR ) 10 MG tablet Take 1 tablet (10 mg total) by mouth daily. 90 tablet 1   TURMERIC PO Take by mouth.     No current facility-administered medications for this visit.   No results found.  Review of Systems:   A ROS was performed including pertinent positives and negatives as documented in the HPI.  Physical Exam :   Constitutional: NAD and appears stated age Neurological: Alert and oriented Psych: Appropriate affect and cooperative There were no vitals taken for this visit.   Comprehensive Musculoskeletal Exam:      Musculoskeletal Exam  Gait Normal  Alignment Normal   Right Left  Inspection Normal Normal  Palpation    Tenderness Hoffa's fat pad Hoffa's fat pad  Crepitus None None  Effusion None none  Range of Motion    Extension -3 -3  Flexion 1351 135  Strength    Extension 5/5 5/5  Flexion 5/5 5/5  Ligament Exam     Generalized Laxity No No  Lachman Negative Negative   Pivot Shift Positive Negative  Anterior Drawer Positive Negative  Valgus at 0 Negative Negative  Valgus at 20 Negative Negative  Varus at 0 0 0  Varus at 20   0 0  Posterior Drawer at 90 0 0  Vascular/Lymphatic Exam    Edema None None  Venous Stasis Changes No No  Distal Circulation Normal Normal  Neurologic    Light Touch Sensation Intact Intact  Special Tests:      Imaging:   Xray (4 views right knee, 4 views left knee): Normal  MRI (right knee, left knee): Complete tear of the right ACL with anterior tibial translation, normal left knee.  There is signal intensity at the  insertion of bilateral patella tendon  I personally reviewed and interpreted the radiographs.   Assessment:   68 y.o. male with evidence of extensor mechanism pain in the setting of Hoffa fat pad irritation.  I did describe that this is fairly consistent as he is experiencing the pain on the left just as bad as the right despite not having an ACL on the right.  I did describe that not having intact ACL would put him at risk for further irritation of the Hoffa's fat pad irritation although I do believe that this is likely the predominant cause of his pain.  At today's visit we did discuss that his left knee is more symptomatic than the right.  Given this I did discuss arthroscopic options.  I did discuss arthroscopic plica excision as well as the associated recovery timeframe.  After discussion he would like to proceed  Plan :    - Plan for left knee arthroscopy with plica excision, Hoffa's fat pad excision   After a lengthy discussion of treatment options, including risks, benefits, alternatives, complications of surgical and nonsurgical conservative options, the patient elected surgical repair.   The patient  is aware of the material risks  and complications including, but not limited to injury to adjacent structures, neurovascular injury, infection, numbness, bleeding, implant failure, thermal  burns, stiffness, persistent pain, failure to heal, disease transmission from allograft, need for further surgery, dislocation, anesthetic risks, blood clots, risks of death,and others. The probabilities of surgical success and failure discussed with patient given their particular co-morbidities.The time and nature of expected rehabilitation and recovery was discussed.The patient's questions were all answered preoperatively.  No barriers to understanding were noted. I explained the natural history of the disease process and Rx rationale.  I explained to the patient what I considered to be reasonable  expectations given their personal situation.  The final treatment plan was arrived at through a shared patient decision making process model.      I personally saw and evaluated the patient, and participated in the management and treatment plan.  Elspeth Parker, MD Attending Physician, Orthopedic Surgery  This document was dictated using Dragon voice recognition software. A reasonable attempt at proof reading has been made to minimize errors.

## 2023-11-06 ENCOUNTER — Telehealth: Payer: Self-pay

## 2023-11-06 NOTE — Telephone Encounter (Signed)
 Surgery clearance received from [facility], Pt is needing L knee arthroscopy with Plica excision w/ Dr. Genelle, surgery date: TBD. Last OV 08/26/23, last EKG on 11/04/23. Please advise if appt is needed to be completed.

## 2023-11-06 NOTE — Telephone Encounter (Signed)
 No known CAD. Cholesterol is well-controlled. Please contact patient.  If he denies chest pain, DOE, leg swelling, he is clear from my side otherwise please schedule a visit

## 2023-11-07 NOTE — Telephone Encounter (Signed)
 Spoke w/ Pt- he denies CP, SHOB, edema. Informed PCP will complete forms for him and have them faxed back. Pt verbalized understanding.

## 2023-11-07 NOTE — Telephone Encounter (Signed)
 Received fax confirmation

## 2023-11-07 NOTE — Telephone Encounter (Signed)
 Form faxed back to Attn: April at [facility] [phone]. Form sent for scanning.

## 2023-11-10 ENCOUNTER — Telehealth (HOSPITAL_BASED_OUTPATIENT_CLINIC_OR_DEPARTMENT_OTHER): Payer: Self-pay | Admitting: Orthopaedic Surgery

## 2023-11-10 DIAGNOSIS — G8929 Other chronic pain: Secondary | ICD-10-CM

## 2023-11-10 NOTE — Telephone Encounter (Signed)
 Patient has questions about his surgery. I did transferred to April to see if she could help. Patient best contact [phone]

## 2023-11-14 NOTE — Telephone Encounter (Signed)
 Patient wanted to know if he can take Mobic  with Ibuprofen . Patient did stop Mobic  and started Naproxen  for right knee. Naproxen  400mg  twice a day seems to help more in taking away some pain. Patient wanted some direction with medication until he is seen again for right knee. Please call to advise. Patient is aware doctor is out of the office until next week.

## 2023-11-17 ENCOUNTER — Other Ambulatory Visit (HOSPITAL_BASED_OUTPATIENT_CLINIC_OR_DEPARTMENT_OTHER): Payer: Self-pay | Admitting: Orthopaedic Surgery

## 2023-12-04 ENCOUNTER — Other Ambulatory Visit: Payer: Self-pay

## 2023-12-04 ENCOUNTER — Ambulatory Visit
Admission: RE | Admit: 2023-12-04 | Discharge: 2023-12-04 | Disposition: A | Discharge status: Home / Self Care | Attending: Physician Assistant | Admitting: Physician Assistant

## 2023-12-04 VITALS — BP 134/97 | HR 92 | Temp 98.1°F | Resp 18 | Ht 69.0 in | Wt 197.0 lb

## 2023-12-04 DIAGNOSIS — J9801 Acute bronchospasm: Secondary | ICD-10-CM

## 2023-12-04 MED ORDER — IPRATROPIUM-ALBUTEROL 0.5-2.5 (3) MG/3ML IN SOLN
3.0000 mL | Freq: Once | RESPIRATORY_TRACT | Status: AC
  Administered 2023-12-04: 3 mL via RESPIRATORY_TRACT

## 2023-12-04 MED ORDER — ALBUTEROL SULFATE HFA 108 (90 BASE) MCG/ACT IN AERS: 1.0000 | INHALATION_SPRAY | Freq: Four times a day (QID) | RESPIRATORY_TRACT | 0 refills | Status: AC | PRN

## 2023-12-04 NOTE — ED Provider Notes (Signed)
 [facility]    CSN: [id number] Arrival date & time: 12/04/23  1059      History   Chief Complaint Chief Complaint  Patient presents with   Cough    Bronchitis - Entered by patient    HPI Ludwig Tugwell. is a 68 y.o. male.   HPI  Pt is here for concerns of lingering cough that is intermittently productive of small amounts of mucus He states he felt a small amount of wheezing with exhalation last night. He states the coughing is worse at night and seems to get better if he sits up. He reports the cough seems to be higher up in the chest.  Reports that coughing seems to calm down a bit with drinking fluids or changing his activity  Interventions: nothing specific for the coughing He denies similar symptoms in other members of household or recent travel    Past Medical History:  Diagnosis Date   Allergy    Arthritis 2022   knees   Hx of colonic polyp 2007   Hyperlipidemia    Insomnia 06/02/2014   Seizures (HCC) 1983   Syncope    in 20s; no etiology, no recurrence. Dilantin  Rxed    Patient Active Problem List   Diagnosis Date Noted   PCP NOTES >>>>>>> 02/05/2023   Fatty liver 03/07/2022   Alcohol use 03/07/2022   Hyperglycemia 08/21/2018   Annual physical exam 07/19/2015   DJD (degenerative joint disease) 11/30/2014   Colon cancer screening 06/02/2014   Insomnia 06/02/2014   Nonspecific abnormal electrocardiogram (ECG) (EKG) 01/24/2012   Syncope  ---> remote, on Dilantin  since the 80s. 12/27/2010   VITAMIN D  DEFICIENCY 07/05/2009   Elevated LFTs 07/05/2009   History of colonic polyps 06/28/2008   Osteopenia 03/11/2007   Dyslipidemia 09/25/2006   ALLERGIC RHINITIS 09/25/2006    Past Surgical History:  Procedure Laterality Date   colonoscopy with polypectomy  2007   Dr Avram, 4 mm rectal polyp   DENTAL SURGERY     bone grafts, to get implants   NASAL FRACTURE SURGERY     TONSILLECTOMY AND ADENOIDECTOMY         Home Medications     Prior to Admission medications   Medication Sig Start Date End Date Taking? Authorizing Provider  albuterol  (VENTOLIN  HFA) 108 (90 Base) MCG/ACT inhaler Inhale 1-2 puffs into the lungs every 6 (six) hours as needed for wheezing or shortness of breath. 12/04/23  Yes Wilda Wetherell E, PA-C  acetaminophen  (TYLENOL ) 650 MG CR tablet Take 650 mg by mouth 2 (two) times daily as needed for pain.    [provider]  ALPRAZolam  (XANAX ) 0.25 MG tablet Take 2 tablets (0.5 mg total) by mouth at bedtime as needed for sleep. 08/22/23   Amon Aloysius BRAVO, MD  aspirin  EC 325 MG tablet Take 1 tablet (325 mg total) by mouth daily. 11/05/23   Genelle Standing, MD  Cholecalciferol (VITAMIN D3) 50 MCG (2000 UT) capsule Take 1 capsule (2,000 Units total) by mouth daily. 08/26/23   Paz, Jose E, MD  Coenzyme Q10-Fish Oil-Vit E (CO-Q 10 OMEGA-3 FISH OIL PO) Take by mouth.    [provider]  DILANTIN  100 MG ER capsule TAKE 5 CAPSULES DAILY AS   DIRECTED 09/16/23   Paz, Jose E, MD  ezetimibe  (ZETIA ) 10 MG tablet Take 1 tablet (10 mg total) by mouth daily. 07/18/23   Paz, Jose E, MD  fexofenadine (ALLEGRA) 180 MG tablet Take 180 mg by mouth  daily.    [provider]  fluocinonide gel (LIDEX) 0.05 % Apply 1 application topically 3 (three) times daily as needed.  05/17/13   [provider]  meclizine  (ANTIVERT ) 25 MG tablet Take 1 tablet (25 mg total) by mouth 3 (three) times daily as needed for dizziness. 11/04/23   Reddick, Johnathan B, NP  Melatonin 10 MG TABS Take by mouth.    [provider]  meloxicam  (MOBIC ) 15 MG tablet Take 1 tablet (15 mg total) by mouth daily. 10/22/23   Overturf, Jackson L, PA-C  MILK THISTLE PO  04/22/22   [provider]  MULTIPLE VITAMINS PO Multiple Vitamin    [provider]  naproxen  (NAPROSYN ) 500 MG tablet TAKE 1 TABLET(500 MG) BY MOUTH DAILY AS NEEDED FOR MODERATE PAIN 03/29/21   Paz, Jose E, MD  oxyCODONE  (ROXICODONE ) 5 MG immediate release tablet  Take 1 tablet (5 mg total) by mouth every 4 (four) hours as needed for severe pain (pain score 7-10) or breakthrough pain. 11/05/23   Genelle Standing, MD  Probiotic Product (PROBIOTIC ADVANCED PO) Take by mouth.    [provider]  rosuvastatin  (CRESTOR ) 10 MG tablet Take 1 tablet (10 mg total) by mouth daily. 07/18/23   Paz, Jose E, MD  TURMERIC PO Take by mouth.    [provider]    Family History Family History  Problem Relation Age of Onset   Cancer Mother        throat; smoker   Macular degeneration Father    Macular degeneration Paternal Uncle    Rectal cancer Other        maternal cousin   Colon cancer Neg Hx    Esophageal cancer Neg Hx    Stomach cancer Neg Hx    Stroke Neg Hx    Heart disease Neg Hx    Diabetes Neg Hx    Prostate cancer Neg Hx     Social History Social History   Tobacco Use   Smoking status: Never   Smokeless tobacco: Never  Vaping Use   Vaping status: Never Used  Substance Use Topics   Alcohol use: Yes    Alcohol/week: 2.0 standard drinks of alcohol    Types: 2 Cans of beer per week    Comment: drinks less , qod, 2 beers   Drug use: Not Currently    Types: PCP     Allergies   Patient has no known allergies.   Review of Systems Review of Systems  Constitutional:  Negative for chills and fever.  HENT:  Negative for congestion, postnasal drip and rhinorrhea.   Respiratory:  Positive for cough and wheezing. Negative for chest tightness and shortness of breath.      Physical Exam Triage Vital Signs ED Triage Vitals  Encounter Vitals Group     BP 12/04/23 1119 (!) 134/97     Girls Systolic BP Percentile --      Girls Diastolic BP Percentile --      Boys Systolic BP Percentile --      Boys Diastolic BP Percentile --      Pulse Rate 12/04/23 1119 92     Resp 12/04/23 1119 18     Temp 12/04/23 1119 98.1 F (36.7 C)     Temp Source 12/04/23 1119 Oral     SpO2 12/04/23 1119 94 %     Weight 12/04/23 1121 197 lb (89.4  kg)     Height 12/04/23 1121 5' 9 (1.753 m)  Head Circumference --      Peak Flow --      Pain Score 12/04/23 1120 3     Pain Loc --      Pain Education --      Exclude from Growth Chart --    No data found.  Updated Vital Signs BP (!) 134/97 (BP Location: Right Arm)   Pulse 92   Temp 98.1 F (36.7 C) (Oral)   Resp 18   Ht 5' 9 (1.753 m)   Wt 197 lb (89.4 kg)   SpO2 94%   BMI 29.09 kg/m   Visual Acuity Right Eye Distance:   Left Eye Distance:   Bilateral Distance:    Right Eye Near:   Left Eye Near:    Bilateral Near:     Physical Exam Vitals reviewed.  Constitutional:      General: He is awake. He is not in acute distress.    Appearance: Normal appearance. He is well-developed and well-groomed. He is not ill-appearing or toxic-appearing.  HENT:     Head: Normocephalic and atraumatic.     Mouth/Throat:     Lips: Pink.     Mouth: Mucous membranes are moist.     Pharynx: Oropharynx is clear. Uvula midline. No pharyngeal swelling, oropharyngeal exudate, posterior oropharyngeal erythema, uvula swelling or postnasal drip.     Tonsils: No tonsillar exudate or tonsillar abscesses.  Eyes:     General: Lids are normal. Gaze aligned appropriately.     Extraocular Movements: Extraocular movements intact.  Cardiovascular:     Rate and Rhythm: Normal rate and regular rhythm.     Heart sounds: Normal heart sounds.  Pulmonary:     Effort: Pulmonary effort is normal.     Breath sounds: Normal breath sounds. Decreased air movement present. No decreased breath sounds, wheezing, rhonchi or rales.     Comments: Mildly decreased air movement in lung bases   Musculoskeletal:     Cervical back: Normal range of motion and neck supple.  Lymphadenopathy:     Head:     Right side of head: No submental, submandibular or preauricular adenopathy.     Left side of head: No submental, submandibular or preauricular adenopathy.     Cervical:     Right cervical: No superficial  cervical adenopathy.    Left cervical: No superficial cervical adenopathy.     Upper Body:     Right upper body: No supraclavicular adenopathy.     Left upper body: No supraclavicular adenopathy.  Skin:    General: Skin is warm and dry.  Neurological:     General: No focal deficit present.     Mental Status: He is alert and oriented to person, place, and time.  Psychiatric:        Mood and Affect: Mood normal.        Behavior: Behavior normal. Behavior is cooperative.        Thought Content: Thought content normal.        Judgment: Judgment normal.      UC Treatments / Results  Labs (all labs ordered are listed, but only abnormal results are displayed) Labs Reviewed - No data to display  EKG   Radiology No results found.  Procedures Procedures (including critical care time)  Medications Ordered in UC Medications  ipratropium-albuterol  (DUONEB) 0.5-2.5 (3) MG/3ML nebulizer solution 3 mL (3 mLs Nebulization Given 12/04/23 1205)    Initial Impression / Assessment and Plan / UC Course  I have reviewed  the triage vital signs and the nursing notes.  Pertinent labs & imaging results that were available during my care of the patient were reviewed by me and considered in my medical decision making (see chart for details).      Final Clinical Impressions(s) / UC Diagnoses   Final diagnoses:  Cough due to bronchospasm   Patient presents today with concerns for lingering predominantly dry cough that has been ongoing for the last 7 to 10 days.  He reports that the coughing seems worse at night but gets better if he sits up.  He denies signs or symptoms of heartburn and reports that drinking fluids or changing his activity seems to reduce the coughing as well.  He has not taken any specific medications for cough.  Physical exam is notable for mildly decreased air movement in bilateral lung bases but no evidence of wheezes, rales, decreased lung sounds.  DuoNeb breathing treatment  was administered here in clinic to assist with potential pulmonary inflammation.  Patient reports some improvement following administration and physical exam findings are notable for increased air movement in the bases.  At this time I suspect his coughing is likely secondary to bronchospasm.  Will send patient home with albuterol  rescue inhaler to assist with this.  Recommend taking Robitussin per manufactures instructions for cough symptoms as well.  ED and return precautions reviewed and provided in after visit summary.  Follow-up as needed    Discharge Instructions      You were seen today for concerns of coughing.  At this time I suspect that your symptoms are due to something called a bronchospasm which is aggravating the coughing reflex and causing your persistent coughing and need to clear your throat.  This is usually improved with inhaled steroid such as albuterol .  I am sending in a refill of your albuterol  rescue inhaler for you to use up to every 6 hours as needed for coughing or shortness of breath. You can also use regular formulations of Robitussin or Mucinex and Flonase  to further assist with your symptoms.  If you feel like your symptoms are not improving or seem to be worsening I recommend follow-up with your PCP or return to urgent care for further evaluation.  If you start to develop any of the following symptoms please go to the ER: Significant shortness of breath or difficulty breathing, fever or chills that are not responding to Tylenol , chest pain, loss of consciousness or confusion.     ED Prescriptions     Medication Sig Dispense Auth. Provider   albuterol  (VENTOLIN  HFA) 108 (90 Base) MCG/ACT inhaler Inhale 1-2 puffs into the lungs every 6 (six) hours as needed for wheezing or shortness of breath. 8 g Abigal Choung E, PA-C      PDMP not reviewed this encounter.   Marylene Rocky BRAVO, PA-C 12/04/23 2201

## 2023-12-04 NOTE — ED Triage Notes (Signed)
 Pt presents with complaints of cough x 7-10 days. States his cough is much worse at nighttime. Standing and sitting up helps with the symptoms. Pt noticed expiratory wheezing last night. Denies SOB. OTC Aleve  + Allegra taken daily, no noticeable improvement. Pt is concerned as he has knee (tendon) surgery scheduled for next week.

## 2023-12-04 NOTE — Discharge Instructions (Addendum)
 You were seen today for concerns of coughing.  At this time I suspect that your symptoms are due to something called a bronchospasm which is aggravating the coughing reflex and causing your persistent coughing and need to clear your throat.  This is usually improved with inhaled steroid such as albuterol .  I am sending in a refill of your albuterol  rescue inhaler for you to use up to every 6 hours as needed for coughing or shortness of breath. You can also use regular formulations of Robitussin or Mucinex and Flonase  to further assist with your symptoms.  If you feel like your symptoms are not improving or seem to be worsening I recommend follow-up with your PCP or return to urgent care for further evaluation.  If you start to develop any of the following symptoms please go to the ER: Significant shortness of breath or difficulty breathing, fever or chills that are not responding to Tylenol , chest pain, loss of consciousness or confusion.

## 2023-12-09 ENCOUNTER — Other Ambulatory Visit: Payer: Self-pay | Admitting: Internal Medicine

## 2023-12-09 ENCOUNTER — Encounter: Payer: Self-pay | Admitting: Orthopaedic Surgery

## 2023-12-09 DIAGNOSIS — M2351 Chronic instability of knee, right knee: Secondary | ICD-10-CM | POA: Diagnosis not present

## 2023-12-09 NOTE — Progress Notes (Signed)
   Date of Surgery: 12/09/2023  INDICATIONS: Kenneth [name] is a 68 y.o.-year-old male with left knee plica syndrome.  The risk and benefits of the procedure were discussed in detail and documented in the pre-operative evaluation.   PREOPERATIVE DIAGNOSIS: 1. Left knee plica syndrome  POSTOPERATIVE DIAGNOSIS: Same.  PROCEDURE: 1. Left knee limited synovectomy  SURGEON: Elspeth LITTIE Parker MD  ASSISTANT: Conley Dawson, ATC  ANESTHESIA:  general  IV FLUIDS AND URINE: See anesthesia record.  ANTIBIOTICS: Ancef  ESTIMATED BLOOD LOSS: 10 mL.  IMPLANTS:  * No surgical log found *  DRAINS: None  CULTURES: None  COMPLICATIONS: none  DESCRIPTION OF PROCEDURE:   Examination under anesthesia: A careful examination under anesthesia was performed.  Knee ROM motion was: -3 - 135  Lachman: Normal Pivot Shift: Normal Posterior drawer: normal.   Varus stability in full extension: normal.   Varus stability in 30 degrees of flexion: normal.  Valgus stability in full extension: normal.   Valgus stability in 30 degrees of flexion: normal.  Posterolateral drawer: normal   Intra-operative findings: A thorough arthroscopic examination of the knee was performed.  The findings are: 1. Suprapatellar pouch: Normal 2. Undersurface of median ridge: Normal 3. Medial patellar facet: Normal 4. Lateral patellar facet: Normal 5. Trochlea: Normal 6. Lateral gutter/popliteus tendon: Normal 7. Hoffa's fat pad: Normal 8. Medial gutter/plica: Normal 9. ACL: Normal 10. PCL: Normal 11. Medial meniscus: Normal 12. Medial compartment cartilage: grade 2 cartilage loss under plica 13. Lateral meniscus: Normal 14. Lateral compartment cartilage: Normal     I identified the patient in the pre-operative holding area.  I marked the operative knee with my initials. I reviewed the risks and benefits of the proposed surgical intervention and the patient wished to proceed.  Anesthesia performed a peripheral nerve  block.  Patient was subsequently taken back to the operating room.  The patient was transferred to the operative suite and placed in the supine position with all bony prominences padded.     SCDs were placed on the non-operative lower extremity. Appropriate antibiotics was administered within 1 hour before incision. The operative lower extremity was then prepped and draped in standard fashion. A time out was performed confirming the correct extremity, correct patient and correct procedure.    A standard anterolateral portal was made with an 11 blade.  The ideal position for the anteromedial portal was established using a spinal needle.  This AM portal was then created under direct visualization with an 11 blade.  A full diagnostic arthroscopy was then performed, as described above, including probing of the chondral and meniscal surfaces.     At this time the shaver was introduced and the medial plica was excised with shaver and electrocautery.  That concluded the case.  Skin was closed with 2-0 Vicryl and 3-0 nylon. Xeroform gauze, gauze, Tegaderm, Iceman and brace were applied.  Instrument, sponge, and needle counts were correct prior to wound closure and at the conclusion of the case.  The patient was taken to the PACU without complication    POSTOPERATIVE PLAN: He will be weight bearing as tolerated. He will be placed on aspirin  for blood clot prevention. He will not require formal PT.  Elspeth LITTIE Parker, MD 2:32 PM

## 2023-12-10 ENCOUNTER — Telehealth (HOSPITAL_BASED_OUTPATIENT_CLINIC_OR_DEPARTMENT_OTHER): Payer: Self-pay | Admitting: Orthopaedic Surgery

## 2023-12-10 NOTE — Telephone Encounter (Signed)
 Patient wants to know how long does he have to wear the compression sleeve on his non surgical leg

## 2023-12-10 NOTE — Telephone Encounter (Signed)
 Returned call to patient informing him he should keep compression sock on 48 hours but encouraged activity as tolerated as well as aspirin 

## 2023-12-24 ENCOUNTER — Ambulatory Visit (INDEPENDENT_AMBULATORY_CARE_PROVIDER_SITE_OTHER): Admitting: Orthopaedic Surgery

## 2023-12-24 ENCOUNTER — Other Ambulatory Visit (HOSPITAL_BASED_OUTPATIENT_CLINIC_OR_DEPARTMENT_OTHER): Payer: Self-pay

## 2023-12-24 DIAGNOSIS — E8889 Other specified metabolic disorders: Secondary | ICD-10-CM

## 2023-12-24 MED ORDER — CYCLOBENZAPRINE HCL 10 MG PO TABS
10.0000 mg | ORAL_TABLET | Freq: Three times a day (TID) | ORAL | 2 refills | Status: DC | PRN
  Filled 2023-12-24: qty 30, 10d supply, fill #0

## 2023-12-24 NOTE — Progress Notes (Signed)
 Post Operative Evaluation    Procedure/Date of Surgery: Left knee plica excision 8/19  Interval History:    Send 2 weeks status post above procedure.  At this time he is still having some persistent swelling and pain in the knee.  This does feel hotter compared to the contralateral side particularly when he takes the temperature.  This is only occurring at night   PMH/PSH/Family History/Social History/Meds/Allergies:    Past Medical History:  Diagnosis Date   Allergy    Arthritis 2022   knees   Hx of colonic polyp 2007   Hyperlipidemia    Insomnia 06/02/2014   Seizures (HCC) 1983   Syncope    in 20s; no etiology, no recurrence. 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, sports  Tobacco Use   Smoking status: Never   Smokeless tobacco: Never  Vaping Use   Vaping status: Never Used  Substance and Sexual Activity   Alcohol use: Yes    Alcohol/week: 2.0 standard drinks of alcohol    Types: 2 Cans of beer per week    Comment: drinks less , qod, 2 beers   Drug use: Not Currently    Types: PCP   Sexual activity: Not Currently  Other Topics Concern   Not on file  Social History Narrative   Household pt and wife   Social Drivers of Corporate investment banker Strain: Low Risk  (08/28/2023)   Overall Financial Resource Strain (CARDIA)    Difficulty of Paying Living Expenses: Not hard at all  Food Insecurity: No Food  Insecurity (08/28/2023)   Hunger Vital Sign    Worried About Running Out of Food in the Last Year: Never true    Ran Out of Food in the Last Year: Never true  Transportation Needs: No Transportation Needs (08/28/2023)   PRAPARE - Administrator, Civil Service (Medical): No    Lack of Transportation (Non-Medical): No  Physical Activity: Inactive (08/28/2023)   Exercise Vital Sign    Days of Exercise per Week: 0 days    Minutes of Exercise per Session: 0 min  Stress: No Stress Concern Present (08/28/2023)   [employer] of Occupational Health - Occupational Stress Questionnaire    Feeling of Stress : Not at all  Social Connections: Socially Integrated (08/28/2023)   Social Connection and Isolation Panel    Frequency of Communication with Friends and Family: More than three times a week    Frequency of Social Gatherings with Friends and Family: More than three times a week    Attends Religious Services: 1 to 4 times per year    Active Member of [employer] or Organizations: Yes    Attends [employer]  or Organization Meetings: More than 4 times per year    Marital Status: Married   Family History  Problem Relation Age of Onset   Cancer Mother        throat; smoker   Macular degeneration Father    Macular degeneration Paternal Uncle    Rectal cancer Other        maternal cousin   Colon cancer Neg Hx    Esophageal cancer Neg Hx    Stomach cancer Neg Hx    Stroke Neg Hx    Heart disease Neg Hx    Diabetes Neg Hx    Prostate cancer Neg Hx    No Known Allergies Current Outpatient Medications  Medication Sig Dispense Refill   cyclobenzaprine  (FLEXERIL ) 10 MG tablet Take 1 tablet (10 mg total) by mouth 3 (three) times daily as needed for muscle spasms. 30 tablet 2   acetaminophen  (TYLENOL ) 650 MG CR tablet Take 650 mg by mouth 2 (two) times daily as needed for pain.     albuterol  (VENTOLIN  HFA) 108 (90 Base) MCG/ACT inhaler Inhale 1-2 puffs into the lungs every 6 (six) hours as needed for  wheezing or shortness of breath. 8 g 0   ALPRAZolam  (XANAX ) 0.25 MG tablet Take 2 tablets (0.5 mg total) by mouth at bedtime as needed for sleep. 60 tablet 5   aspirin  EC 325 MG tablet Take 1 tablet (325 mg total) by mouth daily. 14 tablet 0   Cholecalciferol (VITAMIN D3) 50 MCG (2000 UT) capsule Take 1 capsule (2,000 Units total) by mouth daily.     Coenzyme Q10-Fish Oil-Vit E (CO-Q 10 OMEGA-3 FISH OIL PO) Take by mouth.     DILANTIN  100 MG ER capsule TAKE 5 CAPSULES DAILY AS   DIRECTED 450 capsule 1   ezetimibe  (ZETIA ) 10 MG tablet Take 1 tablet (10 mg total) by mouth daily. 90 tablet 1   fexofenadine (ALLEGRA) 180 MG tablet Take 180 mg by mouth daily.     fluocinonide gel (LIDEX) 0.05 % Apply 1 application topically 3 (three) times daily as needed.      meclizine  (ANTIVERT ) 25 MG tablet Take 1 tablet (25 mg total) by mouth 3 (three) times daily as needed for dizziness. 30 tablet 0   Melatonin 10 MG TABS Take by mouth.     meloxicam  (MOBIC ) 15 MG tablet Take 1 tablet (15 mg total) by mouth daily. 30 tablet 0   MILK THISTLE PO      MULTIPLE VITAMINS PO Multiple Vitamin     naproxen  (NAPROSYN ) 500 MG tablet TAKE 1 TABLET(500 MG) BY MOUTH DAILY AS NEEDED FOR MODERATE PAIN 30 tablet 3   oxyCODONE  (ROXICODONE ) 5 MG immediate release tablet Take 1 tablet (5 mg total) by mouth every 4 (four) hours as needed for severe pain (pain score 7-10) or breakthrough pain. 10 tablet 0   Probiotic Product (PROBIOTIC ADVANCED PO) Take by mouth.     rosuvastatin  (CRESTOR ) 10 MG tablet Take 1 tablet (10 mg total) by mouth daily. 90 tablet 1   TURMERIC PO Take by mouth.     No current facility-administered medications for this visit.   No results found.  Review of Systems:   A ROS was performed including pertinent positives and negatives as documented in the HPI.   Musculoskeletal Exam:    There were no vitals taken for this visit.  Left knee incisions are well-appearing without erythema or drainage.   Range of motion is from 0 to 130 degrees,  no joint line tenderness.  Imaging:      I personally reviewed and interpreted the radiographs.   Assessment:   2 weeks status post left knee plica excision overall with some persistent continued pain at night.  I will plan to add Flexeril  to hopefully help him with symptoms at night.  Will plan to see him back in 4 weeks for progress check  Plan :    - Return to clinic 4 weeks for reassessment      I personally saw and evaluated the patient, and participated in the management and treatment plan.  Elspeth Parker, MD Attending Physician, Orthopedic Surgery  This document was dictated using Dragon voice recognition software. A reasonable attempt at proof reading has been made to minimize errors.

## 2024-01-01 ENCOUNTER — Other Ambulatory Visit (HOSPITAL_BASED_OUTPATIENT_CLINIC_OR_DEPARTMENT_OTHER): Payer: Self-pay | Admitting: Orthopaedic Surgery

## 2024-01-01 DIAGNOSIS — G8929 Other chronic pain: Secondary | ICD-10-CM

## 2024-01-01 NOTE — Telephone Encounter (Signed)
Order placed for gel injection.

## 2024-01-16 NOTE — Telephone Encounter (Signed)
 From April Note on 01/07/24 Approved for Monovisc, bilateral knee Buy & Bill Medicare deductible has been met Secondary plan BCBS will pick up remaining eligible expenses at 100% No Co-pay No PA required

## 2024-01-19 ENCOUNTER — Ambulatory Visit (INDEPENDENT_AMBULATORY_CARE_PROVIDER_SITE_OTHER): Admitting: Student

## 2024-01-19 DIAGNOSIS — M25562 Pain in left knee: Secondary | ICD-10-CM

## 2024-01-19 DIAGNOSIS — M25561 Pain in right knee: Secondary | ICD-10-CM | POA: Diagnosis not present

## 2024-01-19 DIAGNOSIS — G8929 Other chronic pain: Secondary | ICD-10-CM

## 2024-01-19 MED ORDER — HYALURONAN 88 MG/4ML IX SOSY
88.0000 mg | PREFILLED_SYRINGE | INTRA_ARTICULAR | Status: AC | PRN
  Administered 2024-01-19: 88 mg via INTRA_ARTICULAR

## 2024-01-19 MED ORDER — LIDOCAINE HCL 1 % IJ SOLN
4.0000 mL | INTRAMUSCULAR | Status: AC | PRN
  Administered 2024-01-19: 4 mL

## 2024-01-19 NOTE — Progress Notes (Signed)
     HPI: Patient presents today for bilateral Monovisc injections.  He continues to have pain in both knees, slightly worse on the left which recently underwent arthroscopy with plica excision in August.  Patient is managing with rest, ice, and ibuprofen .   Physical Exam: Arthroscopic incisions of the left knee are well-appearing without erythema or drainage.  Bilateral active range of motion is from 0 to 130 degrees without any significant medial or lateral joint line tenderness.  There is a mild effusion present in the left knee.  No overlying erythema or warmth.   Procedure Note  Patient: Kenneth [name].             Date of Birth: Jun 04, 1955           MRN: [id number]             Visit Date: 01/19/2024  Procedures: Visit Diagnoses:  1. Chronic pain of right knee   2. Chronic pain of left knee     Large Joint Inj: bilateral knee on 01/19/2024 10:05 AM Indications: pain Details: 22 G 1.5 in needle, anterolateral approach Medications (Right): 4 mL lidocaine  1 %; 88 mg Hyaluronan 88 MG/4ML Medications (Left): 4 mL lidocaine  1 %; 88 mg Hyaluronan 88 MG/4ML Outcome: tolerated well, no immediate complications Procedure, treatment alternatives, risks and benefits explained, specific risks discussed. Consent was given by the patient. Immediately prior to procedure a time out was called to verify the correct patient, procedure, equipment, support staff and site/side marked as required. Patient was prepped and draped in the usual sterile fashion.       Plan: Return to clinic as needed    I personally saw and evaluated the patient, and participated in the management and treatment plan.  Leonce Reveal, PA-C Orthopedics

## 2024-01-22 ENCOUNTER — Encounter (HOSPITAL_BASED_OUTPATIENT_CLINIC_OR_DEPARTMENT_OTHER): Admitting: Orthopaedic Surgery

## 2024-02-07 ENCOUNTER — Other Ambulatory Visit: Payer: Self-pay | Admitting: Internal Medicine

## 2024-02-10 ENCOUNTER — Encounter: Payer: Self-pay | Admitting: Internal Medicine

## 2024-02-10 ENCOUNTER — Ambulatory Visit: Admitting: Internal Medicine

## 2024-02-10 VITALS — BP 130/84 | HR 106 | Temp 97.6°F | Resp 16 | Ht 70.0 in | Wt 205.0 lb

## 2024-02-10 DIAGNOSIS — Z125 Encounter for screening for malignant neoplasm of prostate: Secondary | ICD-10-CM

## 2024-02-10 DIAGNOSIS — Z23 Encounter for immunization: Secondary | ICD-10-CM | POA: Diagnosis not present

## 2024-02-10 DIAGNOSIS — Z79899 Other long term (current) drug therapy: Secondary | ICD-10-CM

## 2024-02-10 DIAGNOSIS — R55 Syncope and collapse: Secondary | ICD-10-CM | POA: Diagnosis not present

## 2024-02-10 DIAGNOSIS — E785 Hyperlipidemia, unspecified: Secondary | ICD-10-CM

## 2024-02-10 DIAGNOSIS — M25561 Pain in right knee: Secondary | ICD-10-CM

## 2024-02-10 DIAGNOSIS — M25562 Pain in left knee: Secondary | ICD-10-CM

## 2024-02-10 DIAGNOSIS — G8929 Other chronic pain: Secondary | ICD-10-CM

## 2024-02-10 DIAGNOSIS — G4700 Insomnia, unspecified: Secondary | ICD-10-CM

## 2024-02-10 LAB — LIPID PANEL
Cholesterol: 182 mg/dL (ref 0–200)
HDL: 67 mg/dL (ref >39.00)
LDL Cholesterol: 96 mg/dL (ref 0–99)
NonHDL: 115
Total CHOL/HDL Ratio: 3
Triglycerides: 95 mg/dL (ref 0.0–149.0)
VLDL: 19 mg/dL (ref 0.0–40.0)

## 2024-02-10 LAB — BASIC METABOLIC PANEL WITH GFR
BUN: 14 mg/dL (ref 6–23)
CO2: 25 meq/L (ref 19–32)
Calcium: 9.1 mg/dL (ref 8.4–10.5)
Chloride: 99 meq/L (ref 96–112)
Creatinine, Ser: 0.75 mg/dL (ref 0.40–1.50)
GFR: 91.53 mL/min (ref >60.00)
Glucose, Bld: 102 mg/dL — ABNORMAL HIGH (ref 70–99)
Potassium: 4.2 meq/L (ref 3.5–5.1)
Sodium: 133 meq/L — ABNORMAL LOW (ref 135–145)

## 2024-02-10 LAB — PSA: PSA: 0.81 ng/mL (ref 0.10–4.00)

## 2024-02-10 NOTE — Patient Instructions (Addendum)
 GO TO THE LAB :  Get the blood work    Then, go to the front desk for the checkout Please make an appointment for a checkup in 4 to 6 months

## 2024-02-10 NOTE — Assessment & Plan Note (Signed)
 Chronic bilateral knee pain with sleep disturbance Going on for 3 years per patient, under orthopedic care.  Multiple interventions have failed. Feels very frustrated  we talked about a second opinion from another orthopedic doctor and he already thought about it. Unable to walk too far, cannot exercise, rec to consider stationary bike. On naproxen , recommend to take it with food to prevent GI issues. Continue ice packs. Hyperlipidemia Managed with rosuvastatin  and ezetimibe .  Check FLP. History of syncope: On Dilantin , checking levels History of increased LFTs: Checking levels Insomnia: On Xanax , check UDS. EtOH: Reports 2 beers 3-4 times a week.  New guidelines discussed, 1 (or none) drinks a day is the new recommendation Preventive care: Flu shot today, check a PSA, no LUTS. RTC 4 to 6 months

## 2024-02-10 NOTE — Progress Notes (Signed)
 Subjective:    Patient ID: Kenneth [name]., male    DOB: 12-26-1955, 68 y.o.   MRN: [id number]  DOS:  02/10/2024 Follow-up  Discussed the use of AI scribe software for clinical note transcription with the patient, who gave verbal consent to proceed.  History of Present Illness Chronic medical problems addressed.  Bilateral knee pain, worse at night.  Disrupt his sleep. Follow-up by orthopedics, multiple interventions have failed including arthroscopy. Currently on naproxen  as needed. Ice packs seems to be the more effective way to treat him. Anything other than routine ADLs exacerbates pain  + Impaired quality of life due to persistent pain     Review of Systems See above   Past Medical History:  Diagnosis Date   Allergy    Arthritis 2022   knees   Hx of colonic polyp 2007   Hyperlipidemia    Insomnia 06/02/2014   Seizures (HCC) 1983   Syncope    in 20s; no etiology, no recurrence. Dilantin  Rxed    Past Surgical History:  Procedure Laterality Date   colonoscopy with polypectomy  2007   Dr Avram, 4 mm rectal polyp   DENTAL SURGERY     bone grafts, to get implants   NASAL FRACTURE SURGERY     TONSILLECTOMY AND ADENOIDECTOMY      Current Outpatient Medications  Medication Instructions   acetaminophen  (TYLENOL ) 650 mg, 2 times daily PRN   albuterol  (VENTOLIN  HFA) 108 (90 Base) MCG/ACT inhaler 1-2 puffs, Inhalation, Every 6 hours PRN   ALPRAZolam  (XANAX ) 0.5 mg, Oral, At bedtime PRN   aspirin  EC 325 mg, Oral, Daily   Coenzyme Q10-Fish Oil-Vit E (CO-Q 10 OMEGA-3 FISH OIL PO) Take by mouth.   cyclobenzaprine  (FLEXERIL ) 10 mg, Oral, 3 times daily PRN   DILANTIN  100 MG ER capsule TAKE 5 CAPSULES DAILY AS   DIRECTED   ezetimibe  (ZETIA ) 10 mg, Oral, Daily   fexofenadine (ALLEGRA) 180 mg, Daily   fluocinonide gel (LIDEX) 0.05 % 1 application , 3 times daily PRN   meclizine  (ANTIVERT ) 25 mg, Oral, 3 times daily PRN   Melatonin 10 MG TABS Take by mouth.    meloxicam  (MOBIC ) 15 mg, Oral, Daily   MILK THISTLE PO    MULTIPLE VITAMINS PO Multiple Vitamin   naproxen  (NAPROSYN ) 500 MG tablet TAKE 1 TABLET(500 MG) BY MOUTH DAILY AS NEEDED FOR MODERATE PAIN   oxyCODONE  (ROXICODONE ) 5 mg, Oral, Every 4 hours PRN   Probiotic Product (PROBIOTIC ADVANCED PO) Take by mouth.   rosuvastatin  (CRESTOR ) 10 mg, Oral, Daily   TURMERIC PO Take by mouth.   Vitamin D3 2,000 Units, Oral, Daily       Objective:   Physical Exam Resp 16   Ht 5' 10 (1.778 m)   Wt 205 lb (93 kg)   BMI 29.41 kg/m  General:   Well developed, NAD, BMI noted. HEENT:  Normocephalic . Face symmetric, atraumatic Lungs:  CTA B Normal respiratory effort, no intercostal retractions, no accessory muscle use. Heart: RRR,  no murmur.  Lower extremities: no pretibial edema bilaterally  Skin: Not pale. Not jaundice Neurologic:  alert & oriented X3.  Speech normal, gait appropriate for age and unassisted Psych--  Cognition and judgment appear intact.  Cooperative with normal attention span and concentration.  Behavior appropriate. No anxious or depressed appearing.      Assessment    Assessment Hyperlipidemia Insomnia-- xanax  prn, rx per pcp Allergies Syncope, in the 1980s, on Dilantin  since then, used  to see Dr. Maurice, reluctant to stop dilantin  Elevated LFTs:  Chronic, on-off x years  --  hepatitis B C (-) 2008, iron, ferritin, alpha-1 antitrypsin normal 2016.  --CT 4-/2018: Hepatic steatosis.   --SPEP 2019 (-).   --GI visit 02-2022: Multiple labs negative, likely multifactorial: EtOH, fatty liver.  Rec to f/u w/ PCP Incomplete RBBB-previously a stress test was negative Osteopenia?   DEXA 07/18/2016 normal Increase MCV: Normal B12, thiamine and folic acid   Assessment & Plan Chronic bilateral knee pain with sleep disturbance Going on for 3 years per patient, under orthopedic care.  Multiple interventions have failed. Feels very frustrated  we talked about a second opinion  from another orthopedic doctor and he already thought about it. Unable to walk too far, cannot exercise, rec to consider stationary bike. On naproxen , recommend to take it with food to prevent GI issues. Continue ice packs. Hyperlipidemia Managed with rosuvastatin  and ezetimibe .  Check FLP. History of syncope: On Dilantin , checking levels History of increased LFTs: Checking levels Insomnia: On Xanax , check UDS. EtOH: Reports 2 beers 3-4 times a week.  New guidelines discussed, 1 (or none) drinks a day is the new recommendation Preventive care: Flu shot today, check a PSA, no LUTS. RTC 4 to 6 months

## 2024-02-11 ENCOUNTER — Telehealth: Payer: Self-pay | Admitting: *Deleted

## 2024-02-11 NOTE — Telephone Encounter (Signed)
 [facility] sent a fax over needing to clarify the following specimen for dilantin  level source/type.    Called [facility] and advised that it was serum that was sent.

## 2024-02-12 LAB — DRUG MONITORING PANEL 375977 , URINE
Alcohol Metabolites: POSITIVE ng/mL — AB (ref <500)
Alphahydroxyalprazolam: 28 ng/mL — ABNORMAL HIGH (ref <25)
Alphahydroxymidazolam: NEGATIVE ng/mL (ref <50)
Alphahydroxytriazolam: NEGATIVE ng/mL (ref <50)
Aminoclonazepam: NEGATIVE ng/mL (ref <25)
Amphetamines: NEGATIVE ng/mL (ref <500)
Barbiturates: NEGATIVE ng/mL (ref <300)
Benzodiazepines: POSITIVE ng/mL — AB (ref <100)
Cocaine Metabolite: NEGATIVE ng/mL (ref <150)
Desmethyltramadol: NEGATIVE ng/mL (ref <100)
Ethyl Glucuronide (ETG): 6344 ng/mL — ABNORMAL HIGH (ref <500)
Ethyl Sulfate (ETS): 742 ng/mL — ABNORMAL HIGH (ref <100)
Hydroxyethylflurazepam: NEGATIVE ng/mL (ref <50)
Lorazepam: NEGATIVE ng/mL (ref <50)
Marijuana Metabolite: NEGATIVE ng/mL (ref <20)
Nordiazepam: NEGATIVE ng/mL (ref <50)
Opiates: NEGATIVE ng/mL (ref <100)
Oxazepam: NEGATIVE ng/mL (ref <50)
Oxycodone: NEGATIVE ng/mL (ref <100)
Temazepam: NEGATIVE ng/mL (ref <50)
Tramadol: NEGATIVE ng/mL (ref <100)

## 2024-02-12 LAB — TIQ-NTM

## 2024-02-12 LAB — DM TEMPLATE

## 2024-02-12 LAB — PHENYTOIN LEVEL, TOTAL: Phenytoin, Total: 14 mg/L (ref 10.0–20.0)

## 2024-02-15 ENCOUNTER — Ambulatory Visit: Payer: Self-pay | Admitting: Internal Medicine

## 2024-02-23 ENCOUNTER — Encounter: Payer: Self-pay | Admitting: Radiology

## 2024-02-25 NOTE — Addendum Note (Signed)
 Addended by: VANDERBILT LIONEL CROME on: 02/25/2024 10:12 AM   Modules accepted: Orders

## 2024-02-27 ENCOUNTER — Ambulatory Visit (INDEPENDENT_AMBULATORY_CARE_PROVIDER_SITE_OTHER): Admitting: Pain Medicine

## 2024-02-27 VITALS — BP 134/82 | Ht 70.0 in | Wt 200.0 lb

## 2024-02-27 DIAGNOSIS — M25562 Pain in left knee: Secondary | ICD-10-CM | POA: Diagnosis not present

## 2024-02-27 DIAGNOSIS — M25561 Pain in right knee: Secondary | ICD-10-CM

## 2024-02-27 DIAGNOSIS — G8929 Other chronic pain: Secondary | ICD-10-CM | POA: Diagnosis not present

## 2024-02-27 DIAGNOSIS — M170 Bilateral primary osteoarthritis of knee: Secondary | ICD-10-CM | POA: Diagnosis present

## 2024-02-27 MED ORDER — CAPSAICIN 0.033 % EX CREA: TOPICAL_CREAM | CUTANEOUS | 2 refills | Status: AC

## 2024-02-27 MED ORDER — DULOXETINE HCL 30 MG PO CPEP: ORAL_CAPSULE | ORAL | 3 refills | Status: AC

## 2024-02-27 NOTE — Progress Notes (Signed)
 DATE OF VISIT: 02/27/2024        [name]. DOB: 04-21-1956 MRN: [id number]  CC:  bilateral knee pain  History- Kenneth [name]. is a 68 y.o. male in clinic for evaluation and treatment of bilateral knee pain.  3 year history of pain.  ACL injury in R knee in 20s, but L side is worse now.  Has had multiple injections to include steroid and hyaluronic acid.  Attempts to be active, but knee pain happens throughout day. No issues with strength/mobility outside of pain.  Pain complaint: Location: bilateral knees L>R at 55/45%.  Primarily over patellar tendon but often involves entire knee. Duration: 3 years Quality: aching Severity: 5-6/10 on average Timing: worse at night Radiation: denies Aggravating factors: laying in bed at night, more activity during day Alleviating factors: NSAIDs, compression sleeves, ice packs Attribution: arthritis  Meds attempted: ibuprofen , naproxen , voltaren gel.    PT performed several years ago without benefit.    Past Medical History Past Medical History:  Diagnosis Date   Allergy    Arthritis 2022   knees   Hx of colonic polyp 2007   Hyperlipidemia    Insomnia 06/02/2014   Seizures (HCC) 1983   Syncope    in 20s; no etiology, no recurrence. Dilantin  Rxed    Past Surgical History Past Surgical History:  Procedure Laterality Date   colonoscopy with polypectomy  2007   Dr Avram, 4 mm rectal polyp   DENTAL SURGERY     bone grafts, to get implants   NASAL FRACTURE SURGERY     TONSILLECTOMY AND ADENOIDECTOMY      Medications Current Outpatient Medications  Medication Sig Dispense Refill   Capsaicin 0.033 % CREA Apply small amount topically to both knees three times a day for pain. 56.6 g 2   DULoxetine (CYMBALTA) 30 MG capsule Start with 1 capsule once daily for 2 weeks, then increase to 2 capsules once daily (60mg ) afterwards.  Continue at this dose. 60 capsule 3   acetaminophen  (TYLENOL ) 650 MG CR tablet Take 650 mg by mouth  2 (two) times daily as needed for pain.     albuterol  (VENTOLIN  HFA) 108 (90 Base) MCG/ACT inhaler Inhale 1-2 puffs into the lungs every 6 (six) hours as needed for wheezing or shortness of breath. 8 g 0   ALPRAZolam  (XANAX ) 0.25 MG tablet Take 2 tablets (0.5 mg total) by mouth at bedtime as needed for sleep. 60 tablet 5   Cholecalciferol (VITAMIN D3) 50 MCG (2000 UT) capsule Take 1 capsule (2,000 Units total) by mouth daily.     Coenzyme Q10-Fish Oil-Vit E (CO-Q 10 OMEGA-3 FISH OIL PO) Take by mouth.     DILANTIN  100 MG ER capsule TAKE 5 CAPSULES DAILY AS   DIRECTED 450 capsule 1   ezetimibe  (ZETIA ) 10 MG tablet Take 1 tablet (10 mg total) by mouth daily. 90 tablet 1   fexofenadine (ALLEGRA) 180 MG tablet Take 180 mg by mouth daily.     fluocinonide gel (LIDEX) 0.05 % Apply 1 application topically 3 (three) times daily as needed.      meclizine  (ANTIVERT ) 25 MG tablet Take 1 tablet (25 mg total) by mouth 3 (three) times daily as needed for dizziness. 30 tablet 0   Melatonin 10 MG TABS Take by mouth.     MILK THISTLE PO      MULTIPLE VITAMINS PO Multiple Vitamin     naproxen  (NAPROSYN ) 500 MG tablet TAKE 1 TABLET(500 MG) BY  MOUTH DAILY AS NEEDED FOR MODERATE PAIN 30 tablet 3   Probiotic Product (PROBIOTIC ADVANCED PO) Take by mouth.     rosuvastatin  (CRESTOR ) 10 MG tablet Take 1 tablet (10 mg total) by mouth daily. 90 tablet 1   TURMERIC PO Take by mouth.     No current facility-administered medications for this visit.    Allergies has no known allergies.  Family History - reviewed per EMR and intake form  Social History   reports current alcohol use of about 2.0 standard drinks of alcohol per week.  reports that he has never smoked. He has never used smokeless tobacco.  reports that he does not currently use drugs after having used the following drugs: PCP.  EXAM: Vitals: BP 134/82   Ht 5' 10 (1.778 m)   Wt 200 lb (90.7 kg)   BMI 28.70 kg/m  General: AOx3, NAD, pleasant SKIN:  no rashes or lesions, skin clean, dry, intact MSK: Knee sleeves in place bilaterally.  No crepitus.  5/5 strength.  No effusions noted.  No tenderness to palpation.  NEURO: sensation intact to light touch  IMAGING: MRIs from 08/31/22 reviewed.  ASSESSMENT:   ICD-10-CM   1. Osteoarthritis of patellofemoral joints of both knees  M17.0     2. Chronic pain of both knees  M25.561    M25.562    G89.29        PLAN: 1. Will start cymbalta 30mg  with increase to 60mg  daily after 2 weeks for knee pain. 2. Start capsaicin cream TID topically. 3. Discussed potential genicular nerve blocks vs PRP vs radiation therapy for knee OA.  Patient is interested in trying these if needed. 4. RTC in 1 month.  Will consider genicular blocks at that time.   The patient will return to see me in 1 month, will call sooner with any questions/concerns.   Patient expressed understanding & agreement with above.

## 2024-03-01 ENCOUNTER — Encounter: Payer: Self-pay | Admitting: Pain Medicine

## 2024-03-15 ENCOUNTER — Encounter: Payer: Self-pay | Admitting: Pain Medicine

## 2024-03-23 ENCOUNTER — Ambulatory Visit (INDEPENDENT_AMBULATORY_CARE_PROVIDER_SITE_OTHER): Admitting: Radiology

## 2024-03-23 ENCOUNTER — Ambulatory Visit
Admission: RE | Admit: 2024-03-23 | Discharge: 2024-03-23 | Disposition: A | Discharge status: Home / Self Care | Source: Ambulatory Visit | Attending: Student | Admitting: Student

## 2024-03-23 VITALS — BP 156/92 | HR 88 | Temp 97.6°F | Resp 17

## 2024-03-23 DIAGNOSIS — S7001XA Contusion of right hip, initial encounter: Secondary | ICD-10-CM | POA: Diagnosis not present

## 2024-03-23 DIAGNOSIS — W19XXXA Unspecified fall, initial encounter: Secondary | ICD-10-CM

## 2024-03-23 NOTE — ED Provider Notes (Signed)
 [facility]    CSN: [id number] Arrival date & time: 03/23/24  1049      History   Chief Complaint Chief Complaint  Patient presents with   Hip Pain    Entered by patient    HPI Kenneth [name]. is a 68 y.o. male presenting w R hip pain x2 weeks following fall. Pt c/o right hip pain for about 2 weeks. States he fell with leaf blowing back pack 2 weeks ago and landed on hip and has had pain since. His pain is worse later in the day and has some swelling. Pain is worse with walking up stairs or getting in and out of his truck. Minimal pain with standing and walking on flat surfaces. Ibuprofen  provides temporary relief.  HPI  Past Medical History:  Diagnosis Date   Allergy    Arthritis 2022   knees   Hx of colonic polyp 2007   Hyperlipidemia    Insomnia 06/02/2014   Seizures (HCC) 1983   Syncope    in 20s; no etiology, no recurrence. Dilantin  Rxed    Patient Active Problem List   Diagnosis Date Noted   PCP NOTES >>>>>>> 02/05/2023   Fatty liver 03/07/2022   Alcohol use 03/07/2022   Hyperglycemia 08/21/2018   Annual physical exam 07/19/2015   DJD (degenerative joint disease) 11/30/2014   Insomnia 06/02/2014   Nonspecific abnormal electrocardiogram (ECG) (EKG) 01/24/2012   Syncope  ---> remote, on Dilantin  since the 80s. 12/27/2010   VITAMIN D  DEFICIENCY 07/05/2009   Elevated LFTs 07/05/2009   History of colonic polyps 06/28/2008   Osteopenia 03/11/2007   Dyslipidemia 09/25/2006   ALLERGIC RHINITIS 09/25/2006    Past Surgical History:  Procedure Laterality Date   colonoscopy with polypectomy  2007   Dr Avram, 4 mm rectal polyp   DENTAL SURGERY     bone grafts, to get implants   NASAL FRACTURE SURGERY     TONSILLECTOMY AND ADENOIDECTOMY         Home Medications    Prior to Admission medications   Medication Sig Start Date End Date Taking? Authorizing Provider  acetaminophen  (TYLENOL ) 650 MG CR tablet Take 650 mg by mouth 2 (two) times  daily as needed for pain.    [provider]  albuterol  (VENTOLIN  HFA) 108 (90 Base) MCG/ACT inhaler Inhale 1-2 puffs into the lungs every 6 (six) hours as needed for wheezing or shortness of breath. 12/04/23   Mecum, Erin E, PA-C  ALPRAZolam  (XANAX ) 0.25 MG tablet Take 2 tablets (0.5 mg total) by mouth at bedtime as needed for sleep. 08/22/23   Paz, Jose E, MD  Capsaicin  0.033 % CREA Apply small amount topically to both knees three times a day for pain. 02/27/24   Renita Redell ONEIDA, MD  Cholecalciferol (VITAMIN D3) 50 MCG (2000 UT) capsule Take 1 capsule (2,000 Units total) by mouth daily. 08/26/23   Paz, Jose E, MD  Coenzyme Q10-Fish Oil-Vit E (CO-Q 10 OMEGA-3 FISH OIL PO) Take by mouth.    [provider]  DILANTIN  100 MG ER capsule TAKE 5 CAPSULES DAILY AS   DIRECTED 02/09/24   Paz, Jose E, MD  DULoxetine  (CYMBALTA ) 30 MG capsule Start with 1 capsule once daily for 2 weeks, then increase to 2 capsules once daily (60mg ) afterwards.  Continue at this dose. 02/27/24   Renita Redell ONEIDA, MD  ezetimibe  (ZETIA ) 10 MG tablet Take 1 tablet (10 mg total) by mouth daily. 12/10/23   Paz, Jose E,  MD  fexofenadine (ALLEGRA) 180 MG tablet Take 180 mg by mouth daily.    [provider]  fluocinonide gel (LIDEX) 0.05 % Apply 1 application topically 3 (three) times daily as needed.  05/17/13   [provider]  meclizine  (ANTIVERT ) 25 MG tablet Take 1 tablet (25 mg total) by mouth 3 (three) times daily as needed for dizziness. 11/04/23   Reddick, Johnathan B, NP  Melatonin 10 MG TABS Take by mouth.    [provider]  MILK THISTLE PO  04/22/22   [provider]  MULTIPLE VITAMINS PO Multiple Vitamin    [provider]  naproxen  (NAPROSYN ) 500 MG tablet TAKE 1 TABLET(500 MG) BY MOUTH DAILY AS NEEDED FOR MODERATE PAIN 03/29/21   Paz, Jose E, MD  Probiotic Product (PROBIOTIC ADVANCED PO) Take by mouth.    [provider]  rosuvastatin  (CRESTOR ) 10 MG tablet Take 1  tablet (10 mg total) by mouth daily. 12/10/23   Paz, Jose E, MD  TURMERIC PO Take by mouth.    [provider]    Family History Family History  Problem Relation Age of Onset   Cancer Mother        throat; smoker   Macular degeneration Father    Macular degeneration Paternal Uncle    Rectal cancer Other        maternal cousin   Colon cancer Neg Hx    Esophageal cancer Neg Hx    Stomach cancer Neg Hx    Stroke Neg Hx    Heart disease Neg Hx    Diabetes Neg Hx    Prostate cancer Neg Hx     Social History Social History   Tobacco Use   Smoking status: Never   Smokeless tobacco: Never  Vaping Use   Vaping status: Never Used  Substance Use Topics   Alcohol use: Yes    Alcohol/week: 2.0 standard drinks of alcohol    Types: 2 Cans of beer per week    Comment: drinks less , qod, 2 beers   Drug use: Not Currently    Types: PCP     Allergies   Patient has no known allergies.   Review of Systems Review of Systems  Musculoskeletal:        R hip pain     Physical Exam Triage Vital Signs ED Triage Vitals  Encounter Vitals Group     BP 03/23/24 1054 (!) 156/92     Girls Systolic BP Percentile --      Girls Diastolic BP Percentile --      Boys Systolic BP Percentile --      Boys Diastolic BP Percentile --      Pulse Rate 03/23/24 1054 88     Resp 03/23/24 1054 17     Temp 03/23/24 1054 97.6 F (36.4 C)     Temp Source 03/23/24 1054 Oral     SpO2 03/23/24 1054 95 %     Weight --      Height --      Head Circumference --      Peak Flow --      Pain Score 03/23/24 1057 5     Pain Loc --      Pain Education --      Exclude from Growth Chart --    No data found.  Updated Vital Signs BP (!) 156/92 (BP Location: Right Arm)   Pulse 88   Temp 97.6 F (36.4 C) (Oral)  Resp 17   SpO2 95%   Visual Acuity Right Eye Distance:   Left Eye Distance:   Bilateral Distance:    Right Eye Near:   Left Eye Near:    Bilateral Near:     Physical  Exam Vitals reviewed.  Constitutional:      General: He is not in acute distress.    Appearance: Normal appearance. He is not ill-appearing.  HENT:     Head: Normocephalic and atraumatic.  Pulmonary:     Effort: Pulmonary effort is normal.  Skin:    Comments: R hip: no erythema or ecchymosis.  There is effusion overlying the right greater trochanter.  He is able to stand and walk unassisted.  There is pain with internal rotation, but not external rotation.  There is not pain with flexion or extension.  Neurological:     General: No focal deficit present.     Mental Status: He is alert and oriented to person, place, and time.  Psychiatric:        Mood and Affect: Mood normal.        Behavior: Behavior normal.        Thought Content: Thought content normal.        Judgment: Judgment normal.      UC Treatments / Results  Labs (all labs ordered are listed, but only abnormal results are displayed) Labs Reviewed - No data to display  EKG   Radiology DG Hip Unilat W or Wo Pelvis 2-3 Views Right Result Date: 03/23/2024 CLINICAL DATA:  Right hip pain.  Fall 2 weeks ago. EXAM: DG HIP (WITH OR WITHOUT PELVIS) 2-3V RIGHT COMPARISON:  07/08/2022. FINDINGS: There is no evidence of acute fracture or dislocation. Mild osteoarthritis of the bilateral hips with joint space narrowing and small osteophytes. Sacroiliac joints and pubic symphysis are anatomically aligned. IMPRESSION: 1. No acute osseous abnormality. 2. Mild osteoarthritis of the hips. Electronically Signed   By: Harrietta Sherry M.D.   On: 03/23/2024 11:33    Procedures Procedures (including critical care time)  Medications Ordered in UC Medications - No data to display  Initial Impression / Assessment and Plan / UC Course  I have reviewed the triage vital signs and the nursing notes.  Pertinent labs & imaging results that were available during my care of the patient were reviewed by me and considered in my medical decision  making (see chart for details).     Patient is a pleasant 68 year old male presenting with R hip contusion.  Neurovascularly intact.  Xray R hip:  1. No acute osseous abnormality. 2. Mild osteoarthritis of the hips.  Pain is managed with ibuprofen , ice, rest.  Offered crutches, which he declines.  We discussed that a normal x-ray is a good sign, but if his pain persists in 5 to 7 days, I would recommend orthopedics follow-up / possible MRI.  Final Clinical Impressions(s) / UC Diagnoses   Final diagnoses:  Fall, initial encounter  Contusion of right hip, initial encounter     Discharge Instructions      -Your hip is not broken. However, a deep bruise can still be very painful and take weeks to heal! -If your pain persists in another 5-7 days, follow-up with orthopedist. You can go to [facility] walk-in clinic, or call your orthopedist Dr. Vernetta. They may order an MRI.      ED Prescriptions   None    PDMP not reviewed this encounter.   Arlyss Leita BRAVO, PA-C 03/23/24 1155

## 2024-03-23 NOTE — ED Triage Notes (Signed)
 Pt c/o right hip pain for about 2 weeks. States he fell with leaf blowing back pack 2 weeks ago and landed on side and has had pain since. His pain is worse later in the day and has some swelling.

## 2024-03-23 NOTE — Discharge Instructions (Addendum)
-  Your hip is not broken. However, a deep bruise can still be very painful and take weeks to heal! -If your pain persists in another 5-7 days, follow-up with orthopedist. You can go to [facility] walk-in clinic, or call your orthopedist Dr. Vernetta. They may order an MRI.

## 2024-03-26 ENCOUNTER — Ambulatory Visit (INDEPENDENT_AMBULATORY_CARE_PROVIDER_SITE_OTHER): Admitting: Pain Medicine

## 2024-03-26 ENCOUNTER — Other Ambulatory Visit: Payer: Self-pay

## 2024-03-26 VITALS — BP 132/88 | Ht 70.0 in | Wt 200.0 lb

## 2024-03-26 DIAGNOSIS — M170 Bilateral primary osteoarthritis of knee: Secondary | ICD-10-CM

## 2024-03-26 DIAGNOSIS — G8929 Other chronic pain: Secondary | ICD-10-CM

## 2024-03-26 DIAGNOSIS — M25561 Pain in right knee: Secondary | ICD-10-CM

## 2024-03-26 DIAGNOSIS — M25562 Pain in left knee: Secondary | ICD-10-CM

## 2024-03-26 NOTE — Progress Notes (Signed)
 PRE-OP DIAGNOSIS: bilateral knee osteoarthritis PROCEDURE: bilateral knee genicular nerve blocks under US  guidance Performing Physician: Redell Reid, MD   Total Dose:       bupivacaine 0.5%      3  mL     R/B/A of procedure provided.  Risks of infection, bleeding, damage to other structures and nerves explained.    Procedure: The area was prepped in the usual sterile manner with chlorhexidine. Sterile ultrasound gel was applied.  Ultrasound was used to identify the femoral shaft and condyles, along with the tibia shaft and head on the side noted above.  The inflection points were noted at the areas of the superior medial, superior lateral, and inferomedial genicular nerves.  A 25g 3.5 inch Quinky spinal needle was inserted through the skin and to the area of the nerves.  After negative aspiration, 0.5 ml of the above solution was injected at each of the 3 sites above.   There were no complications during this procedure.  The procedure was repeated on the contralateral side.   Patient reported relief upon standing after the procedure.   Followup: The patient tolerated the procedure well without complications.  Standard post-procedure care is explained and return precautions are given.

## 2024-03-29 ENCOUNTER — Other Ambulatory Visit: Payer: Self-pay | Admitting: Internal Medicine

## 2024-03-29 NOTE — Telephone Encounter (Signed)
 Requesting: alprazolam  0.25mg  Contract:02/13/23 UDS:02/10/24 Last Visit: 02/10/24 Next Visit:07/14/24 Last Refill: 08/22/23 #60 and 5RF   Please Advise

## 2024-04-30 ENCOUNTER — Ambulatory Visit: Admitting: Pain Medicine

## 2024-04-30 ENCOUNTER — Other Ambulatory Visit: Payer: Self-pay

## 2024-04-30 ENCOUNTER — Encounter: Payer: Self-pay | Admitting: Pain Medicine

## 2024-04-30 VITALS — BP 132/86 | Ht 69.5 in | Wt 200.0 lb

## 2024-04-30 DIAGNOSIS — M170 Bilateral primary osteoarthritis of knee: Secondary | ICD-10-CM

## 2024-04-30 DIAGNOSIS — M25561 Pain in right knee: Secondary | ICD-10-CM

## 2024-04-30 DIAGNOSIS — G8929 Other chronic pain: Secondary | ICD-10-CM

## 2024-04-30 DIAGNOSIS — M25562 Pain in left knee: Secondary | ICD-10-CM

## 2024-04-30 NOTE — Progress Notes (Signed)
 Patient reported >50% relief that did not last more than a few days.  Would like to proceed with chemoneurolysis over RFA at this time.  PRE-OP DIAGNOSIS: bilateral knee osteoarthritis and pain PROCEDURE: bilateral knee genicular nerve blocks under US  guidance Performing Physician: Redell Reid, MD   Total Dose:       lidocaine  1%  1.49ml   99% dehydrogenated ethanol - 1.71ml   R/B/A of procedure provided.  Risks of infection, bleeding, damage to other structures and nerves explained, including potential for continued pain, pain on injection, and motor weakness.   Procedure: The area was prepped in the usual sterile manner with chlorhexidine. Sterile ultrasound gel was applied.  Ultrasound was used to identify the femoral shaft and condyles, along with the tibia shaft and head on the side noted above.  The inflection points were noted at the areas of the superior medial, superior lateral, and inferomedial genicular nerves.  A 25g 3.5 inch Quinky spinal needle was inserted through the skin and to the area of the nerves.  After negative aspiration, 0.5 ml of the above solution was injected at each of the 3 sites above.   The procedure was repeated on the contralateral knee.  There were no complications during this procedure.   Patient reported relief upon standing after the procedure.   Followup: The patient tolerated the procedure well without complications.  Standard post-procedure care is explained and return precautions are given.

## 2024-05-12 ENCOUNTER — Telehealth: Payer: Self-pay | Admitting: Family

## 2024-05-12 NOTE — Telephone Encounter (Signed)
 Requesting: alprazolam  0.25mg   Contract: 02/13/23 UDS: 02/10/24 Last Visit: 02/10/24 Next Visit: 07/14/24 Last Refill: 03/29/24 #60 and 0RF  Please Advise

## 2024-05-12 NOTE — Telephone Encounter (Signed)
PDMP okay, prescription sent 

## 2024-05-13 ENCOUNTER — Other Ambulatory Visit: Payer: Self-pay

## 2024-05-13 DIAGNOSIS — M170 Bilateral primary osteoarthritis of knee: Secondary | ICD-10-CM

## 2024-06-04 ENCOUNTER — Ambulatory Visit: Admitting: Pain Medicine

## 2024-07-14 ENCOUNTER — Ambulatory Visit: Admitting: Internal Medicine

## 2024-09-01 ENCOUNTER — Ambulatory Visit
